# Patient Record
Sex: Male | Born: 1950 | Race: White | Hispanic: No | Marital: Married | Smoking: Former smoker
Health system: Southern US, Community
[De-identification: ages and names within clinical notes are randomized; demographics above are authoritative.]

## PROBLEM LIST (undated history)

## (undated) DIAGNOSIS — E785 Hyperlipidemia, unspecified: Secondary | ICD-10-CM

## (undated) DIAGNOSIS — E876 Hypokalemia: Secondary | ICD-10-CM

## (undated) DIAGNOSIS — K8020 Calculus of gallbladder without cholecystitis without obstruction: Secondary | ICD-10-CM

## (undated) DIAGNOSIS — H9190 Unspecified hearing loss, unspecified ear: Secondary | ICD-10-CM

## (undated) DIAGNOSIS — J45909 Unspecified asthma, uncomplicated: Secondary | ICD-10-CM

## (undated) DIAGNOSIS — J189 Pneumonia, unspecified organism: Secondary | ICD-10-CM

## (undated) DIAGNOSIS — M1990 Unspecified osteoarthritis, unspecified site: Secondary | ICD-10-CM

## (undated) DIAGNOSIS — I10 Essential (primary) hypertension: Secondary | ICD-10-CM

## (undated) DIAGNOSIS — K219 Gastro-esophageal reflux disease without esophagitis: Secondary | ICD-10-CM

## (undated) DIAGNOSIS — M549 Dorsalgia, unspecified: Secondary | ICD-10-CM

## (undated) HISTORY — DX: Calculus of gallbladder without cholecystitis without obstruction: K80.20

## (undated) HISTORY — PX: ABDOMINAL SURGERY: SHX537

## (undated) HISTORY — PX: COLONOSCOPY: SHX174

## (undated) HISTORY — PX: APPENDECTOMY: SHX54

## (undated) HISTORY — DX: Hypokalemia: E87.6

## (undated) HISTORY — PX: CHOLECYSTECTOMY: SHX55

## (undated) HISTORY — DX: Unspecified osteoarthritis, unspecified site: M19.90

## (undated) HISTORY — PX: BACK SURGERY: SHX140

## (undated) HISTORY — PX: SMALL INTESTINE SURGERY: SHX150

## (undated) HISTORY — DX: Essential (primary) hypertension: I10

## (undated) HISTORY — DX: Hyperlipidemia, unspecified: E78.5

---

## 2003-12-09 ENCOUNTER — Emergency Department: Payer: Self-pay | Admitting: Emergency Medicine

## 2007-02-04 ENCOUNTER — Emergency Department (HOSPITAL_COMMUNITY): Admission: EM | Admit: 2007-02-04 | Discharge: 2007-02-04 | Payer: Self-pay | Admitting: Emergency Medicine

## 2013-04-09 ENCOUNTER — Encounter (INDEPENDENT_AMBULATORY_CARE_PROVIDER_SITE_OTHER): Payer: Self-pay

## 2013-04-09 ENCOUNTER — Ambulatory Visit (INDEPENDENT_AMBULATORY_CARE_PROVIDER_SITE_OTHER): Payer: BC Managed Care – PPO | Admitting: Surgery

## 2013-04-09 ENCOUNTER — Encounter (INDEPENDENT_AMBULATORY_CARE_PROVIDER_SITE_OTHER): Payer: Self-pay | Admitting: Surgery

## 2013-04-09 VITALS — BP 154/84 | HR 80 | Temp 98.6°F | Resp 15 | Ht 73.5 in | Wt 231.6 lb

## 2013-04-09 DIAGNOSIS — K8020 Calculus of gallbladder without cholecystitis without obstruction: Secondary | ICD-10-CM

## 2013-04-09 MED ORDER — PANTOPRAZOLE SODIUM 20 MG PO TBEC: 20.0000 mg | DELAYED_RELEASE_TABLET | Freq: Every day | ORAL | Status: DC

## 2013-04-09 NOTE — Progress Notes (Signed)
Patient ID: Zachary Kim, male   DOB: 07/19/1950, 63 y.o.   MRN: [id number]  Chief Complaint  Patient presents with  . New Evaluation    eval gallstones    HPI Zachary Kim is a 63 y.o. male.  Patient presents at the request Suzan Garibaldi FNP with chief complaint of epigastric and right upper quadrant abdominal pain for 2 months. It is intermittent in nature. Sharp and severe with no radiation to his back. Associated dyspepsia and heartburn. He eats fatty greasy foods. Occasional nausea without vomiting. Pain made better with drinking beer. Fatty foods seem to aggravate it.Pain episodes last minutes hours or more common over last 2 months. HPI  Past Medical History  Diagnosis Date  . Hyperlipidemia   . Arthritis     Past Surgical History  Procedure Laterality Date  . Small intestine surgery      intestine blockage  . Back surgery      lover slipped disc    History reviewed. No pertinent family history.  Social History History  Substance Use Topics  . Smoking status: Former Smoker    Quit date: 04/10/1983  . Smokeless tobacco: Current User     Comment: chew tobacco  . Alcohol Use: 1.8 oz/week    3 Cans of beer per week     Comment: daily    Not on File  Current Outpatient Prescriptions  Medication Sig Dispense Refill  . HYDROcodone-acetaminophen (NORCO/VICODIN) 5-325 MG per tablet       . pantoprazole (PROTONIX) 20 MG tablet Take 1 tablet (20 mg total) by mouth daily.  30 tablet  0  . PROVENTIL HFA 108 (90 BASE) MCG/ACT inhaler        No current facility-administered medications for this visit.    Review of Systems Review of Systems  Constitutional: Negative.   HENT: Negative.   Eyes: Negative.   Respiratory: Negative.   Cardiovascular: Negative.   Gastrointestinal: Positive for nausea and abdominal pain.  Endocrine: Negative.   Genitourinary: Negative.   Allergic/Immunologic: Negative.   Neurological: Negative.   Hematological: Negative.     Psychiatric/Behavioral: Negative.     Blood pressure 154/84, pulse 80, temperature 98.6 F (37 C), temperature source Oral, resp. rate 15, height 6' 1.5" (1.867 m), weight 231 lb 9.6 oz (105.053 kg).  Physical Exam Physical Exam  Constitutional: He is oriented to person, place, and time. He appears well-developed and well-nourished.  HENT:  Head: Normocephalic and atraumatic.  Eyes: Pupils are equal, round, and reactive to light. No scleral icterus.  Neck: Normal range of motion.  Cardiovascular: Normal rate and regular rhythm.   Pulmonary/Chest: Effort normal and breath sounds normal.  Abdominal: Soft. Bowel sounds are normal. There is no tenderness.    Musculoskeletal: Normal range of motion.  Neurological: He is alert and oriented to person, place, and time.  Skin: Skin is warm and dry.  Psychiatric: He has a normal mood and affect. His behavior is normal. Judgment and thought content normal.    Data Reviewed Ultrasound from [facility] shows gallstones multiple without gallbladder wall thickening common bile duct 4 mm  Assessment    Symptomatic cholelithiasis    Plan    Laparoscopic cholecystectomy with cholangiogram.The procedure has been discussed with the patient. Operative and non operative treatments have been discussed. Risks of surgery include bleeding, infection,  Common bile duct injury,  Injury to the stomach,liver, colon,small intestine, abdominal wall,  Diaphragm,  Major blood vessels,  And the need for an open  procedure.  Other risks include worsening of medical problems, death,  DVT and pulmonary embolism, and cardiovascular events.   Medical options have also been discussed. The patient has been informed of long term expectations of surgery and non surgical options,  The patient agrees to proceed.         Dmarius Reeder A. 04/09/2013, 4:30 PM

## 2013-04-09 NOTE — Patient Instructions (Signed)
Laparoscopic Cholecystectomy Laparoscopic cholecystectomy is surgery to remove the gallbladder. The gallbladder is located in the upper right part of the abdomen, behind the liver. It is a storage sac for bile produced in the liver. Bile aids in the digestion and absorption of fats. Cholecystectomy is often done for inflammation of the gallbladder (cholecystitis). This condition is usually caused by a buildup of gallstones (cholelithiasis) in your gallbladder. Gallstones can block the flow of bile, resulting in inflammation and pain. In severe cases, emergency surgery may be required. When emergency surgery is not required, you will have time to prepare for the procedure. Laparoscopic surgery is an alternative to open surgery. Laparoscopic surgery has a shorter recovery time. Your common bile duct may also need to be examined during the procedure. If stones are found in the common bile duct, they may be removed. LET [facility] CARE PROVIDER KNOW ABOUT:  Any allergies you have.  All medicines you are taking, including vitamins, herbs, eye drops, creams, and over-the-counter medicines.  Previous problems you or members of your family have had with the use of anesthetics.  Any blood disorders you have.  Previous surgeries you have had.  Medical conditions you have. RISKS AND COMPLICATIONS Generally, this is a safe procedure. However, as with any procedure, complications can occur. Possible complications include:  Infection.  Damage to the common bile duct, nerves, arteries, veins, or other internal organs such as the stomach, liver, or intestines.  Bleeding.  A stone may remain in the common bile duct.  A bile leak from the cyst duct that is clipped when your gallbladder is removed.  The need to convert to open surgery, which requires a larger incision in the abdomen. This may be necessary if your surgeon thinks it is not safe to continue with a laparoscopic procedure. BEFORE THE  PROCEDURE  Ask your health care provider about changing or stopping any regular medicines. You will need to stop taking aspirin or blood thinners at least 5 days prior to surgery.  Do not eat or drink anything after midnight the night before surgery.  Let your health care provider know if you develop a cold or other infectious problem before surgery. PROCEDURE   You will be given medicine to make you sleep through the procedure (general anesthetic). A breathing tube will be placed in your mouth.  When you are asleep, your surgeon will make several small cuts (incisions) in your abdomen.  A thin, lighted tube with a tiny camera on the end (laparoscope) is inserted through one of the small incisions. The camera on the laparoscope sends a picture to a TV screen in the operating room. This gives the surgeon a good view inside your abdomen.  A gas will be pumped into your abdomen. This expands your abdomen so that the surgeon has more room to perform the surgery.  Other tools needed for the procedure are inserted through the other incisions. The gallbladder is removed through one of the incisions.  After the removal of your gallbladder, the incisions will be closed with stitches, staples, or skin glue. AFTER THE PROCEDURE  You will be taken to a recovery area where your progress will be checked often.  You may be allowed to go home the same day if your pain is controlled and you can tolerate liquids. Document Released: 02/01/2005 Document Revised: 11/22/2012 Document Reviewed: 09/13/2012 [facility] Patient Information 2014 [place]. Fat and Cholesterol Control Diet Fat and cholesterol levels in your blood and organs are influenced by  your diet. High levels of fat and cholesterol may lead to diseases of the heart, small and large blood vessels, gallbladder, liver, and pancreas. CONTROLLING FAT AND CHOLESTEROL WITH DIET Although exercise and lifestyle factors are important, your diet is  key. That is because certain foods are known to raise cholesterol and others to lower it. The goal is to balance foods for their effect on cholesterol and more importantly, to replace saturated and trans fat with other types of fat, such as monounsaturated fat, polyunsaturated fat, and omega-3 fatty acids. On average, a person should consume no more than 15 to 17 g of saturated fat daily. Saturated and trans fats are considered "bad" fats, and they will raise LDL cholesterol. Saturated fats are primarily found in animal products such as meats, butter, and cream. However, that does not mean you need to give up all your favorite foods. Today, there are good tasting, low-fat, low-cholesterol substitutes for most of the things you like to eat. Choose low-fat or nonfat alternatives. Choose round or loin cuts of red meat. These types of cuts are lowest in fat and cholesterol. Chicken (without the skin), fish, veal, and ground [place] breast are great choices. Eliminate fatty meats, such as hot dogs and salami. Even shellfish have little or no saturated fat. Have a 3 oz (85 g) portion when you eat lean meat, poultry, or fish. Trans fats are also called "partially hydrogenated oils." They are oils that have been scientifically manipulated so that they are solid at room temperature resulting in a longer shelf life and improved taste and texture of foods in which they are added. Trans fats are found in stick margarine, some tub margarines, cookies, crackers, and baked goods.  When baking and cooking, oils are a great substitute for butter. The monounsaturated oils are especially beneficial since it is believed they lower LDL and raise HDL. The oils you should avoid entirely are saturated tropical oils, such as coconut and palm.  Remember to eat a lot from food groups that are naturally free of saturated and trans fat, including fish, fruit, vegetables, beans, grains (barley, rice, couscous, bulgur wheat), and pasta  (without cream sauces).  IDENTIFYING FOODS THAT LOWER FAT AND CHOLESTEROL  Soluble fiber may lower your cholesterol. This type of fiber is found in fruits such as apples, vegetables such as broccoli, potatoes, and carrots, legumes such as beans, peas, and lentils, and grains such as barley. Foods fortified with plant sterols (phytosterol) may also lower cholesterol. You should eat at least 2 g per day of these foods for a cholesterol lowering effect.  Read package labels to identify low-saturated fats, trans fat free, and low-fat foods at the supermarket. Select cheeses that have only 2 to 3 g saturated fat per ounce. Use a heart-healthy tub margarine that is free of trans fats or partially hydrogenated oil. When buying baked goods (cookies, crackers), avoid partially hydrogenated oils. Breads and muffins should be made from whole grains (whole-wheat or whole oat flour, instead of "flour" or "enriched flour"). Buy non-creamy canned soups with reduced salt and no added fats.  FOOD PREPARATION TECHNIQUES  Never deep-fry. If you must fry, either stir-fry, which uses very little fat, or use non-stick cooking sprays. When possible, broil, bake, or roast meats, and steam vegetables. Instead of putting butter or margarine on vegetables, use lemon and herbs, applesauce, and cinnamon (for squash and sweet potatoes). Use nonfat yogurt, salsa, and low-fat dressings for salads.  LOW-SATURATED FAT / LOW-FAT FOOD SUBSTITUTES Meats /  Saturated Fat (g)  Avoid: Steak, marbled (3 oz/85 g) / 11 g  Choose: Steak, lean (3 oz/85 g) / 4 g  Avoid: Hamburger (3 oz/85 g) / 7 g  Choose: Hamburger, lean (3 oz/85 g) / 5 g  Avoid: Ham (3 oz/85 g) / 6 g  Choose: Ham, lean cut (3 oz/85 g) / 2.4 g  Avoid: Chicken, with skin, dark meat (3 oz/85 g) / 4 g  Choose: Chicken, skin removed, dark meat (3 oz/85 g) / 2 g  Avoid: Chicken, with skin, light meat (3 oz/85 g) / 2.5 g  Choose: Chicken, skin removed, light meat (3 oz/85  g) / 1 g Dairy / Saturated Fat (g)  Avoid: Whole milk (1 cup) / 5 g  Choose: Low-fat milk, 2% (1 cup) / 3 g  Choose: Low-fat milk, 1% (1 cup) / 1.5 g  Choose: Skim milk (1 cup) / 0.3 g  Avoid: Hard cheese (1 oz/28 g) / 6 g  Choose: Skim milk cheese (1 oz/28 g) / 2 to 3 g  Avoid: Cottage cheese, 4% fat (1 cup) / 6.5 g  Choose: Low-fat cottage cheese, 1% fat (1 cup) / 1.5 g  Avoid: Ice cream (1 cup) / 9 g  Choose: Sherbet (1 cup) / 2.5 g  Choose: Nonfat frozen yogurt (1 cup) / 0.3 g  Choose: Frozen fruit bar / trace  Avoid: Whipped cream (1 tbs) / 3.5 g  Choose: Nondairy whipped topping (1 tbs) / 1 g Condiments / Saturated Fat (g)  Avoid: Mayonnaise (1 tbs) / 2 g  Choose: Low-fat mayonnaise (1 tbs) / 1 g  Avoid: Butter (1 tbs) / 7 g  Choose: Extra light margarine (1 tbs) / 1 g  Avoid: Coconut oil (1 tbs) / 11.8 g  Choose: Olive oil (1 tbs) / 1.8 g  Choose: Corn oil (1 tbs) / 1.7 g  Choose: Safflower oil (1 tbs) / 1.2 g  Choose: Sunflower oil (1 tbs) / 1.4 g  Choose: Soybean oil (1 tbs) / 2.4 g  Choose: Canola oil (1 tbs) / 1 g Document Released: 02/01/2005 Document Revised: 05/29/2012 Document Reviewed: 07/23/2010 [employer] Patient Information 2014 [place], [place].

## 2013-04-12 ENCOUNTER — Encounter (HOSPITAL_COMMUNITY): Payer: Self-pay | Admitting: Pharmacy Technician

## 2013-04-18 ENCOUNTER — Encounter (HOSPITAL_COMMUNITY): Payer: Self-pay

## 2013-04-18 ENCOUNTER — Encounter (HOSPITAL_COMMUNITY)
Admission: RE | Admit: 2013-04-18 | Discharge: 2013-04-18 | Disposition: A | Discharge status: Home / Self Care | Payer: BC Managed Care – PPO | Source: Ambulatory Visit | Attending: Surgery | Admitting: Surgery

## 2013-04-18 DIAGNOSIS — Z01818 Encounter for other preprocedural examination: Secondary | ICD-10-CM | POA: Insufficient documentation

## 2013-04-18 DIAGNOSIS — Z01812 Encounter for preprocedural laboratory examination: Secondary | ICD-10-CM | POA: Insufficient documentation

## 2013-04-18 HISTORY — DX: Gastro-esophageal reflux disease without esophagitis: K21.9

## 2013-04-18 HISTORY — DX: Unspecified hearing loss, unspecified ear: H91.90

## 2013-04-18 LAB — COMPREHENSIVE METABOLIC PANEL
ALBUMIN: 3.9 g/dL (ref 3.5–5.2)
ALT: 16 U/L (ref 0–53)
AST: 19 U/L (ref 0–37)
Alkaline Phosphatase: 69 U/L (ref 39–117)
BILIRUBIN TOTAL: 0.5 mg/dL (ref 0.3–1.2)
BUN: 13 mg/dL (ref 6–23)
CALCIUM: 9.2 mg/dL (ref 8.4–10.5)
CHLORIDE: 101 meq/L (ref 96–112)
CO2: 23 meq/L (ref 19–32)
Creatinine, Ser: 0.84 mg/dL (ref 0.50–1.35)
GFR calc Af Amer: >90 mL/min (ref >90)
GFR calc non Af Amer: >90 mL/min (ref >90)
Glucose, Bld: 103 mg/dL — ABNORMAL HIGH (ref 70–99)
Potassium: 4.6 mEq/L (ref 3.7–5.3)
Sodium: 139 mEq/L (ref 137–147)
TOTAL PROTEIN: 7.1 g/dL (ref 6.0–8.3)

## 2013-04-18 LAB — CBC WITH DIFFERENTIAL/PLATELET
Basophils Absolute: 0 10*3/uL (ref 0.0–0.1)
Basophils Relative: 0 % (ref 0–1)
EOS PCT: 1 % (ref 0–5)
Eosinophils Absolute: 0.1 10*3/uL (ref 0.0–0.7)
HEMATOCRIT: 40.1 % (ref 39.0–52.0)
HEMOGLOBIN: 14.1 g/dL (ref 13.0–17.0)
Lymphocytes Relative: 17 % (ref 12–46)
Lymphs Abs: 1.4 10*3/uL (ref 0.7–4.0)
MCH: 33.1 pg (ref 26.0–34.0)
MCHC: 35.2 g/dL (ref 30.0–36.0)
MCV: 94.1 fL (ref 78.0–100.0)
MONOS PCT: 7 % (ref 3–12)
Monocytes Absolute: 0.6 10*3/uL (ref 0.1–1.0)
NEUTROS ABS: 6.5 10*3/uL (ref 1.7–7.7)
NEUTROS PCT: 75 % (ref 43–77)
PLATELETS: 187 10*3/uL (ref 150–400)
RBC: 4.26 MIL/uL (ref 4.22–5.81)
RDW: 13.5 % (ref 11.5–15.5)
WBC: 8.6 10*3/uL (ref 4.0–10.5)

## 2013-04-18 NOTE — Progress Notes (Signed)
04/18/13 1200  OBSTRUCTIVE SLEEP APNEA  Have you ever been diagnosed with sleep apnea through a sleep study? No  Do you snore loudly (loud enough to be heard through closed doors)?  1  Do you often feel tired, fatigued, or sleepy during the daytime? 1  Has anyone observed you stop breathing during your sleep? 1  Do you have, or are you being treated for high blood pressure? 0  BMI more than 35 kg/m2? 0  Age over 62 years old? 1  Neck circumference greater than 40 cm/18 inches? 0  Gender: 1  Obstructive Sleep Apnea Score 5  Score 4 or greater  Results sent to PCP

## 2013-04-18 NOTE — Pre-Procedure Instructions (Signed)
Zachary Kim  04/18/2013   Your procedure is scheduled on:  March 10  Report to [facility] Entrance "A" [address] at [employer] AM.  Call this number if you have problems the morning of surgery: [phone]   Remember:   Do not eat food or drink liquids after midnight.   Take these medicines the morning of surgery with A SIP OF WATER: Hydrocodone, Protonix, Proventil (if needed)   STOP Multiple Vitamins and Ibuprofen today   STOP/ Do not take Aspirin, Aleve, Naproxen, Advil, Ibuprofen, Vitamin, Herbs, or Supplements starting today   Do not wear jewelry, make-up or nail polish.  Do not wear lotions, powders, or perfumes. You may wear deodorant.  Do not shave 48 hours prior to surgery. Men may shave face and neck.  Do not bring valuables to the hospital.  [facility] is not responsible                  for any belongings or valuables.               Contacts, dentures or bridgework may not be worn into surgery.  Leave suitcase in the car. After surgery it may be brought to your room.  For patients admitted to the hospital, discharge time is determined by your                treatment team.               Patients discharged the day of surgery will not be allowed to drive  home.  Name and phone number of your driver: Family/ Friend  Special Instructions: See  [facility] Preparing for Surgery   Please read over the following fact sheets that you were given: Pain Booklet, Coughing and Deep Breathing and Surgical Site Infection Prevention

## 2013-04-23 MED ORDER — CEFAZOLIN SODIUM-DEXTROSE 2-3 GM-% IV SOLR
2.0000 g | INTRAVENOUS | Status: AC
  Administered 2013-04-24: 2 g via INTRAVENOUS
  Filled 2013-04-23: qty 50

## 2013-04-24 ENCOUNTER — Inpatient Hospital Stay (HOSPITAL_COMMUNITY)
Admission: RE | Admit: 2013-04-24 | Discharge: 2013-05-01 | DRG: 415 | Disposition: A | Discharge status: Home / Self Care | Payer: BC Managed Care – PPO | Source: Ambulatory Visit | Attending: Surgery | Admitting: Surgery

## 2013-04-24 ENCOUNTER — Encounter (HOSPITAL_COMMUNITY)
Admission: RE | Disposition: A | Discharge status: Home / Self Care | Payer: Self-pay | Source: Ambulatory Visit | Attending: Surgery

## 2013-04-24 ENCOUNTER — Encounter (HOSPITAL_COMMUNITY): Payer: BC Managed Care – PPO | Admitting: Certified Registered Nurse Anesthetist

## 2013-04-24 ENCOUNTER — Encounter (HOSPITAL_COMMUNITY): Payer: Self-pay | Admitting: *Deleted

## 2013-04-24 ENCOUNTER — Ambulatory Visit (HOSPITAL_COMMUNITY): Payer: BC Managed Care – PPO | Admitting: Certified Registered Nurse Anesthetist

## 2013-04-24 DIAGNOSIS — Z5331 Laparoscopic surgical procedure converted to open procedure: Secondary | ICD-10-CM

## 2013-04-24 DIAGNOSIS — K8010 Calculus of gallbladder with chronic cholecystitis without obstruction: Secondary | ICD-10-CM

## 2013-04-24 DIAGNOSIS — Y849 Medical procedure, unspecified as the cause of abnormal reaction of the patient, or of later complication, without mention of misadventure at the time of the procedure: Secondary | ICD-10-CM | POA: Diagnosis not present

## 2013-04-24 DIAGNOSIS — Z79899 Other long term (current) drug therapy: Secondary | ICD-10-CM

## 2013-04-24 DIAGNOSIS — K8020 Calculus of gallbladder without cholecystitis without obstruction: Principal | ICD-10-CM | POA: Diagnosis present

## 2013-04-24 DIAGNOSIS — R1013 Epigastric pain: Secondary | ICD-10-CM

## 2013-04-24 DIAGNOSIS — Z87891 Personal history of nicotine dependence: Secondary | ICD-10-CM

## 2013-04-24 DIAGNOSIS — E785 Hyperlipidemia, unspecified: Secondary | ICD-10-CM | POA: Diagnosis present

## 2013-04-24 DIAGNOSIS — K560 Paralytic ileus: Secondary | ICD-10-CM | POA: Diagnosis not present

## 2013-04-24 DIAGNOSIS — K660 Peritoneal adhesions (postprocedural) (postinfection): Secondary | ICD-10-CM | POA: Diagnosis present

## 2013-04-24 DIAGNOSIS — K929 Disease of digestive system, unspecified: Secondary | ICD-10-CM | POA: Diagnosis not present

## 2013-04-24 DIAGNOSIS — K3189 Other diseases of stomach and duodenum: Secondary | ICD-10-CM | POA: Diagnosis present

## 2013-04-24 DIAGNOSIS — Z9049 Acquired absence of other specified parts of digestive tract: Secondary | ICD-10-CM

## 2013-04-24 HISTORY — PX: CHOLECYSTECTOMY: SHX55

## 2013-04-24 HISTORY — PX: LYSIS OF ADHESION: SHX5961

## 2013-04-24 LAB — CBC
HCT: 38.3 % — ABNORMAL LOW (ref 39.0–52.0)
Hemoglobin: 13.5 g/dL (ref 13.0–17.0)
MCH: 33.8 pg (ref 26.0–34.0)
MCHC: 35.2 g/dL (ref 30.0–36.0)
MCV: 95.8 fL (ref 78.0–100.0)
PLATELETS: 147 10*3/uL — AB (ref 150–400)
RBC: 4 MIL/uL — AB (ref 4.22–5.81)
RDW: 13.9 % (ref 11.5–15.5)
WBC: 17.3 10*3/uL — ABNORMAL HIGH (ref 4.0–10.5)

## 2013-04-24 LAB — CREATININE, SERUM
Creatinine, Ser: 0.93 mg/dL (ref 0.50–1.35)
GFR, EST NON AFRICAN AMERICAN: 89 mL/min — AB (ref >90)

## 2013-04-24 SURGERY — LAPAROSCOPIC CHOLECYSTECTOMY WITH INTRAOPERATIVE CHOLANGIOGRAM
Anesthesia: General | Site: Abdomen

## 2013-04-24 MED ORDER — LACTATED RINGERS IV SOLN
INTRAVENOUS | Status: DC
  Administered 2013-04-24 (×2): via INTRAVENOUS

## 2013-04-24 MED ORDER — CHLORHEXIDINE GLUCONATE 4 % EX LIQD: 1.0000 "application " | Freq: Once | CUTANEOUS | Status: DC

## 2013-04-24 MED ORDER — ONDANSETRON HCL 4 MG/2ML IJ SOLN
INTRAMUSCULAR | Status: AC
  Filled 2013-04-24: qty 2

## 2013-04-24 MED ORDER — LIDOCAINE HCL (CARDIAC) 20 MG/ML IV SOLN
INTRAVENOUS | Status: AC
  Filled 2013-04-24: qty 5

## 2013-04-24 MED ORDER — BUPIVACAINE-EPINEPHRINE 0.25% -1:200000 IJ SOLN
INTRAMUSCULAR | Status: DC | PRN
  Administered 2013-04-24: 30 mL

## 2013-04-24 MED ORDER — ENOXAPARIN SODIUM 40 MG/0.4ML ~~LOC~~ SOLN
40.0000 mg | SUBCUTANEOUS | Status: DC
  Administered 2013-04-25 – 2013-05-01 (×7): 40 mg via SUBCUTANEOUS
  Filled 2013-04-24 (×9): qty 0.4

## 2013-04-24 MED ORDER — ROCURONIUM BROMIDE 50 MG/5ML IV SOLN
INTRAVENOUS | Status: AC
  Filled 2013-04-24: qty 1

## 2013-04-24 MED ORDER — LIDOCAINE HCL (CARDIAC) 20 MG/ML IV SOLN
INTRAVENOUS | Status: DC | PRN
  Administered 2013-04-24: 100 mg via INTRAVENOUS

## 2013-04-24 MED ORDER — DEXTROSE IN LACTATED RINGERS 5 % IV SOLN
INTRAVENOUS | Status: DC
  Administered 2013-04-24 – 2013-04-25 (×2): via INTRAVENOUS

## 2013-04-24 MED ORDER — GLYCOPYRROLATE 0.2 MG/ML IJ SOLN
INTRAMUSCULAR | Status: DC | PRN
  Administered 2013-04-24: .8 mg via INTRAVENOUS

## 2013-04-24 MED ORDER — PROPOFOL 10 MG/ML IV BOLUS
INTRAVENOUS | Status: DC | PRN
  Administered 2013-04-24: 200 mg via INTRAVENOUS

## 2013-04-24 MED ORDER — SODIUM CHLORIDE 0.9 % IJ SOLN: 9.0000 mL | INTRAMUSCULAR | Status: DC | PRN

## 2013-04-24 MED ORDER — NEOSTIGMINE METHYLSULFATE 1 MG/ML IJ SOLN
INTRAMUSCULAR | Status: DC | PRN
  Administered 2013-04-24: 5 mg via INTRAVENOUS

## 2013-04-24 MED ORDER — OXYCODONE HCL 5 MG/5ML PO SOLN: 5.0000 mg | Freq: Once | ORAL | Status: AC | PRN

## 2013-04-24 MED ORDER — OXYCODONE-ACETAMINOPHEN 5-325 MG PO TABS
1.0000 | ORAL_TABLET | ORAL | Status: DC | PRN
  Administered 2013-04-24 – 2013-04-25 (×2): 2 via ORAL
  Filled 2013-04-24 (×2): qty 2

## 2013-04-24 MED ORDER — PROMETHAZINE HCL 25 MG/ML IJ SOLN: 6.2500 mg | INTRAMUSCULAR | Status: DC | PRN

## 2013-04-24 MED ORDER — NEOSTIGMINE METHYLSULFATE 1 MG/ML IJ SOLN
INTRAMUSCULAR | Status: AC
  Filled 2013-04-24: qty 10

## 2013-04-24 MED ORDER — HYDROMORPHONE HCL PF 1 MG/ML IJ SOLN
INTRAMUSCULAR | Status: AC
  Filled 2013-04-24: qty 1

## 2013-04-24 MED ORDER — HYDROMORPHONE HCL PF 1 MG/ML IJ SOLN
0.2500 mg | INTRAMUSCULAR | Status: DC | PRN
  Administered 2013-04-24 (×4): 0.5 mg via INTRAVENOUS

## 2013-04-24 MED ORDER — MIDAZOLAM HCL 2 MG/2ML IJ SOLN
INTRAMUSCULAR | Status: AC
  Filled 2013-04-24: qty 2

## 2013-04-24 MED ORDER — HEMOSTATIC AGENTS (NO CHARGE) OPTIME
TOPICAL | Status: DC | PRN
  Administered 2013-04-24: 1 via TOPICAL

## 2013-04-24 MED ORDER — MIDAZOLAM HCL 5 MG/5ML IJ SOLN
INTRAMUSCULAR | Status: DC | PRN
  Administered 2013-04-24: 2 mg via INTRAVENOUS

## 2013-04-24 MED ORDER — PROPOFOL 10 MG/ML IV BOLUS
INTRAVENOUS | Status: AC
  Filled 2013-04-24: qty 20

## 2013-04-24 MED ORDER — 0.9 % SODIUM CHLORIDE (POUR BTL) OPTIME
TOPICAL | Status: DC | PRN
  Administered 2013-04-24: 1000 mL

## 2013-04-24 MED ORDER — GLYCOPYRROLATE 0.2 MG/ML IJ SOLN
INTRAMUSCULAR | Status: AC
  Filled 2013-04-24: qty 5

## 2013-04-24 MED ORDER — SODIUM CHLORIDE 0.9 % IV SOLN
INTRAVENOUS | Status: DC | PRN
  Administered 2013-04-24: 10:00:00

## 2013-04-24 MED ORDER — DIPHENHYDRAMINE HCL 12.5 MG/5ML PO ELIX: 12.5000 mg | ORAL_SOLUTION | Freq: Four times a day (QID) | ORAL | Status: DC | PRN

## 2013-04-24 MED ORDER — ROCURONIUM BROMIDE 100 MG/10ML IV SOLN
INTRAVENOUS | Status: DC | PRN
  Administered 2013-04-24: 10 mg via INTRAVENOUS
  Administered 2013-04-24 (×3): 5 mg via INTRAVENOUS
  Administered 2013-04-24: 50 mg via INTRAVENOUS
  Administered 2013-04-24: 5 mg via INTRAVENOUS
  Administered 2013-04-24: 10 mg via INTRAVENOUS

## 2013-04-24 MED ORDER — NALOXONE HCL 0.4 MG/ML IJ SOLN: 0.4000 mg | INTRAMUSCULAR | Status: DC | PRN

## 2013-04-24 MED ORDER — ONDANSETRON HCL 4 MG/2ML IJ SOLN
INTRAMUSCULAR | Status: DC | PRN
  Administered 2013-04-24: 4 mg via INTRAVENOUS

## 2013-04-24 MED ORDER — FENTANYL CITRATE 0.05 MG/ML IJ SOLN: 50.0000 ug | Freq: Once | INTRAMUSCULAR | Status: DC

## 2013-04-24 MED ORDER — ONDANSETRON HCL 4 MG/2ML IJ SOLN
4.0000 mg | Freq: Four times a day (QID) | INTRAMUSCULAR | Status: DC | PRN
  Filled 2013-04-24: qty 2

## 2013-04-24 MED ORDER — ONDANSETRON HCL 4 MG/2ML IJ SOLN
4.0000 mg | Freq: Four times a day (QID) | INTRAMUSCULAR | Status: DC | PRN
  Administered 2013-04-24 – 2013-04-27 (×3): 4 mg via INTRAVENOUS
  Filled 2013-04-24 (×2): qty 2

## 2013-04-24 MED ORDER — FENTANYL CITRATE 0.05 MG/ML IJ SOLN
INTRAMUSCULAR | Status: AC
  Filled 2013-04-24: qty 5

## 2013-04-24 MED ORDER — PANTOPRAZOLE SODIUM 20 MG PO TBEC
20.0000 mg | DELAYED_RELEASE_TABLET | Freq: Every day | ORAL | Status: DC
  Administered 2013-04-25 – 2013-05-01 (×7): 20 mg via ORAL
  Filled 2013-04-24 (×7): qty 1

## 2013-04-24 MED ORDER — ALBUTEROL SULFATE (2.5 MG/3ML) 0.083% IN NEBU: 3.0000 mL | INHALATION_SOLUTION | Freq: Four times a day (QID) | RESPIRATORY_TRACT | Status: DC | PRN

## 2013-04-24 MED ORDER — MORPHINE SULFATE 2 MG/ML IJ SOLN: 2.0000 mg | INTRAMUSCULAR | Status: DC | PRN

## 2013-04-24 MED ORDER — MIDAZOLAM HCL 2 MG/2ML IJ SOLN: 1.0000 mg | INTRAMUSCULAR | Status: DC | PRN

## 2013-04-24 MED ORDER — KETOROLAC TROMETHAMINE 15 MG/ML IJ SOLN
15.0000 mg | Freq: Four times a day (QID) | INTRAMUSCULAR | Status: AC | PRN
  Administered 2013-04-24 – 2013-04-25 (×2): 15 mg via INTRAVENOUS
  Filled 2013-04-24: qty 1

## 2013-04-24 MED ORDER — DIPHENHYDRAMINE HCL 50 MG/ML IJ SOLN: 12.5000 mg | Freq: Four times a day (QID) | INTRAMUSCULAR | Status: DC | PRN

## 2013-04-24 MED ORDER — ONDANSETRON HCL 4 MG PO TABS: 4.0000 mg | ORAL_TABLET | Freq: Four times a day (QID) | ORAL | Status: DC | PRN

## 2013-04-24 MED ORDER — HYDROMORPHONE 0.3 MG/ML IV SOLN
INTRAVENOUS | Status: DC
  Administered 2013-04-24: 13:00:00 via INTRAVENOUS
  Administered 2013-04-24: 3 mg via INTRAVENOUS
  Administered 2013-04-25: 1.2 mg via INTRAVENOUS
  Administered 2013-04-25: 07:00:00 via INTRAVENOUS
  Administered 2013-04-25: 1.2 mg via INTRAVENOUS
  Filled 2013-04-24: qty 25

## 2013-04-24 MED ORDER — LIDOCAINE HCL 4 % MT SOLN
OROMUCOSAL | Status: DC | PRN
  Administered 2013-04-24: 4 mL via TOPICAL

## 2013-04-24 MED ORDER — OXYCODONE HCL 5 MG PO TABS
5.0000 mg | ORAL_TABLET | Freq: Once | ORAL | Status: AC | PRN
Start: 2013-04-24 — End: 2013-04-24
  Administered 2013-04-24: 5 mg via ORAL

## 2013-04-24 MED ORDER — HYDROMORPHONE 0.3 MG/ML IV SOLN
INTRAVENOUS | Status: AC
  Filled 2013-04-24: qty 25

## 2013-04-24 MED ORDER — SODIUM CHLORIDE 0.9 % IR SOLN
Status: DC | PRN
  Administered 2013-04-24: 1000 mL

## 2013-04-24 MED ORDER — FENTANYL CITRATE 0.05 MG/ML IJ SOLN
INTRAMUSCULAR | Status: DC | PRN
  Administered 2013-04-24 (×10): 50 ug via INTRAVENOUS

## 2013-04-24 SURGICAL SUPPLY — 72 items
ADH SKN CLS APL DERMABOND .7 (GAUZE/BANDAGES/DRESSINGS) ×3
APPLICATOR COTTON TIP 6IN STRL (MISCELLANEOUS) ×1 IMPLANT
APPLIER CLIP 5 13 M/L LIGAMAX5 (MISCELLANEOUS) ×8
APPLIER CLIP ROT 10 11.4 M/L (STAPLE) ×4
APR CLP MED LRG 11.4X10 (STAPLE) ×3
APR CLP MED LRG 5 ANG JAW (MISCELLANEOUS) ×6
BAG SPEC RTRVL LRG 6X4 10 (ENDOMECHANICALS) ×3
BLADE SURG 10 STRL SS (BLADE) ×1 IMPLANT
BLADE SURG ROTATE 9660 (MISCELLANEOUS) ×1 IMPLANT
CANISTER SUCTION 2500CC (MISCELLANEOUS) ×4 IMPLANT
CHLORAPREP W/TINT 26ML (MISCELLANEOUS) ×4 IMPLANT
CLIP APPLIE 5 13 M/L LIGAMAX5 (MISCELLANEOUS) IMPLANT
CLIP APPLIE ROT 10 11.4 M/L (STAPLE) ×3 IMPLANT
COVER MAYO STAND STRL (DRAPES) ×4 IMPLANT
COVER SURGICAL LIGHT HANDLE (MISCELLANEOUS) ×4 IMPLANT
DECANTER SPIKE VIAL GLASS SM (MISCELLANEOUS) ×8 IMPLANT
DERMABOND ADVANCED (GAUZE/BANDAGES/DRESSINGS) ×1
DERMABOND ADVANCED .7 DNX12 (GAUZE/BANDAGES/DRESSINGS) ×3 IMPLANT
DRAPE C-ARM 42X72 X-RAY (DRAPES) ×4 IMPLANT
DRAPE UTILITY 15X26 W/TAPE STR (DRAPE) ×8 IMPLANT
DRAPE WARM FLUID 44X44 (DRAPE) ×4 IMPLANT
ELECT BLADE 6.5 EXT (BLADE) ×1 IMPLANT
ELECT CAUTERY BLADE 6.4 (BLADE) ×1 IMPLANT
ELECT REM PT RETURN 9FT ADLT (ELECTROSURGICAL) ×4
ELECTRODE REM PT RTRN 9FT ADLT (ELECTROSURGICAL) ×3 IMPLANT
GAUZE SPONGE 2X2 8PLY STRL LF (GAUZE/BANDAGES/DRESSINGS) IMPLANT
GLOVE BIO SURGEON STRL SZ7 (GLOVE) ×1 IMPLANT
GLOVE BIO SURGEON STRL SZ7.5 (GLOVE) ×1 IMPLANT
GLOVE BIO SURGEON STRL SZ8 (GLOVE) ×4 IMPLANT
GLOVE BIOGEL PI IND STRL 7.0 (GLOVE) IMPLANT
GLOVE BIOGEL PI IND STRL 7.5 (GLOVE) IMPLANT
GLOVE BIOGEL PI IND STRL 8 (GLOVE) ×3 IMPLANT
GLOVE BIOGEL PI INDICATOR 7.0 (GLOVE) ×1
GLOVE BIOGEL PI INDICATOR 7.5 (GLOVE) ×3
GLOVE BIOGEL PI INDICATOR 8 (GLOVE) ×1
GLOVE ECLIPSE 6.5 STRL STRAW (GLOVE) ×1 IMPLANT
GLOVE ECLIPSE 7.5 STRL STRAW (GLOVE) ×1 IMPLANT
GOWN STRL REUS W/ TWL LRG LVL3 (GOWN DISPOSABLE) ×12 IMPLANT
GOWN STRL REUS W/ TWL XL LVL3 (GOWN DISPOSABLE) ×3 IMPLANT
GOWN STRL REUS W/TWL LRG LVL3 (GOWN DISPOSABLE) ×16
GOWN STRL REUS W/TWL XL LVL3 (GOWN DISPOSABLE) ×4
HEMOSTAT SNOW SURGICEL 2X4 (HEMOSTASIS) ×1 IMPLANT
KIT BASIN OR (CUSTOM PROCEDURE TRAY) ×4 IMPLANT
KIT ROOM TURNOVER OR (KITS) ×4 IMPLANT
NS IRRIG 1000ML POUR BTL (IV SOLUTION) ×8 IMPLANT
PAD ARMBOARD 7.5X6 YLW CONV (MISCELLANEOUS) ×4 IMPLANT
PENCIL BUTTON HOLSTER BLD 10FT (ELECTRODE) ×1 IMPLANT
POUCH SPECIMEN RETRIEVAL 10MM (ENDOMECHANICALS) ×4 IMPLANT
SCISSORS LAP 5X35 DISP (ENDOMECHANICALS) ×4 IMPLANT
SET CHOLANGIOGRAPH 5 50 .035 (SET/KITS/TRAYS/PACK) ×4 IMPLANT
SET IRRIG TUBING LAPAROSCOPIC (IRRIGATION / IRRIGATOR) ×4 IMPLANT
SLEEVE ENDOPATH XCEL 5M (ENDOMECHANICALS) ×7 IMPLANT
SPECIMEN JAR SMALL (MISCELLANEOUS) ×4 IMPLANT
SPONGE GAUZE 2X2 STER 10/PKG (GAUZE/BANDAGES/DRESSINGS) ×1
SPONGE GAUZE 4X4 12PLY (GAUZE/BANDAGES/DRESSINGS) ×1 IMPLANT
SPONGE INTESTINAL PEANUT (DISPOSABLE) ×1 IMPLANT
SPONGE LAP 18X18 X RAY DECT (DISPOSABLE) ×3 IMPLANT
STAPLER VISISTAT 35W (STAPLE) ×1 IMPLANT
SUT MNCRL AB 4-0 PS2 18 (SUTURE) ×4 IMPLANT
SUT PDS AB 1 CTX 36 (SUTURE) ×4 IMPLANT
SUT VIC AB 3-0 SH 18 (SUTURE) ×2 IMPLANT
SUT VICRYL AB 2 0 TIES (SUTURE) ×1 IMPLANT
SYR BULB IRRIGATION 50ML (SYRINGE) ×1 IMPLANT
TAPE CLOTH SURG 6X10 WHT LF (GAUZE/BANDAGES/DRESSINGS) ×1 IMPLANT
TOWEL OR 17X24 6PK STRL BLUE (TOWEL DISPOSABLE) ×4 IMPLANT
TOWEL OR 17X26 10 PK STRL BLUE (TOWEL DISPOSABLE) ×4 IMPLANT
TRAY LAPAROSCOPIC (CUSTOM PROCEDURE TRAY) ×4 IMPLANT
TROCAR XCEL BLUNT TIP 100MML (ENDOMECHANICALS) ×4 IMPLANT
TROCAR XCEL NON-BLD 11X100MML (ENDOMECHANICALS) ×5 IMPLANT
TROCAR XCEL NON-BLD 5MMX100MML (ENDOMECHANICALS) ×4 IMPLANT
TUBE CONNECTING 12X1/4 (SUCTIONS) ×1 IMPLANT
YANKAUER SUCT BULB TIP NO VENT (SUCTIONS) ×1 IMPLANT

## 2013-04-24 NOTE — Interval H&P Note (Signed)
History and Physical Interval Note:  04/24/2013 9:10 AM  Zachary Kim  has presented today for surgery, with the diagnosis of gallstones  The various methods of treatment have been discussed with the patient and family. After consideration of risks, benefits and other options for treatment, the patient has consented to  Procedure(s): LAPAROSCOPIC CHOLECYSTECTOMY WITH INTRAOPERATIVE CHOLANGIOGRAM (N/A) as a surgical intervention .  The patient's history has been reviewed, patient examined, no change in status, stable for surgery.  I have reviewed the patient's chart and labs.  Questions were answered to the patient's satisfaction.     Aryeh Butterfield A.

## 2013-04-24 NOTE — Brief Op Note (Signed)
04/24/2013  12:19 PM  PATIENT:  Karle Starch A Lemire  63 y.o. male  PRE-OPERATIVE DIAGNOSIS:  gallstones  POST-OPERATIVE DIAGNOSIS:  gallstones  PROCEDURE:  Laparoscopic cholecystectomy converted to open cholecystectomy.   SURGEON:  Surgeon(s) and Role:    * Priyanka Causey A. Ayyan Sites, MD - Primary    * Imogene Burn. Georgette Dover, MD - Assisting  PHYSICIAN ASSISTANT:   ASSISTANTS: Dr Georgette Dover    ANESTHESIA:   general  EBL:  Total I/O In: 1500 [I.V.:1500] Out: 200 [Blood:200]  BLOOD ADMINISTERED:none  DRAINS: none   LOCAL MEDICATIONS USED:  BUPIVICAINE   SPECIMEN:  Source of Specimen:  gallbladder  DISPOSITION OF SPECIMEN:  PATHOLOGY  COUNTS:  YES  TOURNIQUET:  none  DICTATION: .Other Dictation: Dictation Number [id number]  PLAN OF CARE: Admit to inpatient   PATIENT DISPOSITION:  PACU - hemodynamically stable.   Delay start of Pharmacological VTE agent (>24hrs) due to surgical blood loss or risk of bleeding: no

## 2013-04-24 NOTE — Anesthesia Procedure Notes (Signed)
Procedure Name: Intubation Date/Time: 04/24/2013 9:30 AM Performed by: Maryland Pink Pre-anesthesia Checklist: Patient identified, Timeout performed, Emergency Drugs available, Suction available and Patient being monitored Patient Re-evaluated:Patient Re-evaluated prior to inductionOxygen Delivery Method: Circle system utilized Preoxygenation: Pre-oxygenation with 100% oxygen Intubation Type: IV induction Ventilation: Mask ventilation without difficulty and Oral airway inserted - appropriate to patient size Laryngoscope Size: Mac and 4 Grade View: Grade I Tube type: Oral Tube size: 7.5 mm Number of attempts: 1 Airway Equipment and Method: Stylet and LTA kit utilized Placement Confirmation: ETT inserted through vocal cords under direct vision,  positive ETCO2 and breath sounds checked- equal and bilateral Secured at: 22 cm Tube secured with: Tape Dental Injury: Teeth and Oropharynx as per pre-operative assessment

## 2013-04-24 NOTE — Progress Notes (Signed)
Patient has not voided since surgery.Bladder scanned for 88 cc of urine .Will continue to monitor.

## 2013-04-24 NOTE — Progress Notes (Signed)
Patient up walking in room, when patient got out of bed it was noted that there was blood that had run out from under dressing on right side of abdomen and had soaked into pad through to bed.  Patient denied any dizziness.  Vital signs stable.  Notified MD on call, will continue to monitor.  Dressings reinforced.

## 2013-04-24 NOTE — Progress Notes (Signed)
Care of pt assumed by MA Russell Quinney RN from E. Compton RN 

## 2013-04-24 NOTE — Anesthesia Postprocedure Evaluation (Signed)
  Anesthesia Post-op Note  Patient: Zachary Kim  Procedure(s) Performed: Procedure(s) with comments: LAPAROSCOPIC CHOLECYSTECTOMY WITH INTRAOPERATIVE CHOLANGIOGRAM (N/A) - attempted CHOLECYSTECTOMY LYSIS OF ADHESION (N/A)  Patient Location: PACU  Anesthesia Type:General  Level of Consciousness: awake  Airway and Oxygen Therapy: Patient Spontanous Breathing  Post-op Pain: mild  Post-op Assessment: Post-op Vital signs reviewed, Patient's Cardiovascular Status Stable, Respiratory Function Stable, Patent Airway, No signs of Nausea or vomiting and Pain level controlled  Post-op Vital Signs: Reviewed and stable  Complications: No apparent anesthesia complications

## 2013-04-24 NOTE — Progress Notes (Signed)
Lunch relief by A. Britt RN 

## 2013-04-24 NOTE — Anesthesia Preprocedure Evaluation (Signed)
Anesthesia Evaluation  Patient identified by MRN, date of birth, ID band Patient awake    Reviewed: Allergy & Precautions, H&P , NPO status , Patient's Chart, lab work & pertinent test results  Airway Mallampati: II TM Distance: >3 FB Neck ROM: Full    Dental   Pulmonary Recent URI , former smoker,  + rhonchi         Cardiovascular Rhythm:Regular Rate:Normal     Neuro/Psych    GI/Hepatic GERD-  ,  Endo/Other    Renal/GU      Musculoskeletal   Abdominal   Peds  Hematology   Anesthesia Other Findings   Reproductive/Obstetrics                           Anesthesia Physical Anesthesia Plan  ASA: II  Anesthesia Plan: General   Post-op Pain Management:    Induction: Intravenous  Airway Management Planned: Oral ETT  Additional Equipment:   Intra-op Plan:   Post-operative Plan: Extubation in OR  Informed Consent: I have reviewed the patients History and Physical, chart, labs and discussed the procedure including the risks, benefits and alternatives for the proposed anesthesia with the patient or authorized representative who has indicated his/her understanding and acceptance.     Plan Discussed with: CRNA and Surgeon  Anesthesia Plan Comments:         Anesthesia Quick Evaluation

## 2013-04-24 NOTE — H&P (View-Only) (Signed)
Patient ID: Zachary Kim, male   DOB: 10/23/1950, 63 y.o.   MRN: [id number]  Chief Complaint  Patient presents with  . New Evaluation    eval gallstones    HPI Zachary Kim is a 63 y.o. male.  Patient presents at the request Cheryl Wells FNP with chief complaint of epigastric and right upper quadrant abdominal pain for 2 months. It is intermittent in nature. Sharp and severe with no radiation to his back. Associated dyspepsia and heartburn. He eats fatty greasy foods. Occasional nausea without vomiting. Pain made better with drinking beer. Fatty foods seem to aggravate it.Pain episodes last minutes hours or more common over last 2 months. HPI  Past Medical History  Diagnosis Date  . Hyperlipidemia   . Arthritis     Past Surgical History  Procedure Laterality Date  . Small intestine surgery      intestine blockage  . Back surgery      lover slipped disc    History reviewed. No pertinent family history.  Social History History  Substance Use Topics  . Smoking status: Former Smoker    Quit date: 04/10/1983  . Smokeless tobacco: Current User     Comment: chew tobacco  . Alcohol Use: 1.8 oz/week    3 Cans of beer per week     Comment: daily    Not on File  Current Outpatient Prescriptions  Medication Sig Dispense Refill  . HYDROcodone-acetaminophen (NORCO/VICODIN) 5-325 MG per tablet       . pantoprazole (PROTONIX) 20 MG tablet Take 1 tablet (20 mg total) by mouth daily.  30 tablet  0  . PROVENTIL HFA 108 (90 BASE) MCG/ACT inhaler        No current facility-administered medications for this visit.    Review of Systems Review of Systems  Constitutional: Negative.   HENT: Negative.   Eyes: Negative.   Respiratory: Negative.   Cardiovascular: Negative.   Gastrointestinal: Positive for nausea and abdominal pain.  Endocrine: Negative.   Genitourinary: Negative.   Allergic/Immunologic: Negative.   Neurological: Negative.   Hematological: Negative.     Psychiatric/Behavioral: Negative.     Blood pressure 154/84, pulse 80, temperature 98.6 F (37 C), temperature source Oral, resp. rate 15, height 6' 1.5" (1.867 m), weight 231 lb 9.6 oz (105.053 kg).  Physical Exam Physical Exam  Constitutional: He is oriented to person, place, and time. He appears well-developed and well-nourished.  HENT:  Head: Normocephalic and atraumatic.  Eyes: Pupils are equal, round, and reactive to light. No scleral icterus.  Neck: Normal range of motion.  Cardiovascular: Normal rate and regular rhythm.   Pulmonary/Chest: Effort normal and breath sounds normal.  Abdominal: Soft. Bowel sounds are normal. There is no tenderness.    Musculoskeletal: Normal range of motion.  Neurological: He is alert and oriented to person, place, and time.  Skin: Skin is warm and dry.  Psychiatric: He has a normal mood and affect. His behavior is normal. Judgment and thought content normal.    Data Reviewed Ultrasound from [place] shows gallstones multiple without gallbladder wall thickening common bile duct 4 mm  Assessment    Symptomatic cholelithiasis    Plan    Laparoscopic cholecystectomy with cholangiogram.The procedure has been discussed with the patient. Operative and non operative treatments have been discussed. Risks of surgery include bleeding, infection,  Common bile duct injury,  Injury to the stomach,liver, colon,small intestine, abdominal wall,  Diaphragm,  Major blood vessels,  And the need for an open   procedure.  Other risks include worsening of medical problems, death,  DVT and pulmonary embolism, and cardiovascular events.   Medical options have also been discussed. The patient has been informed of long term expectations of surgery and non surgical options,  The patient agrees to proceed.         Janneth Krasner A. 04/09/2013, 4:30 PM

## 2013-04-24 NOTE — Preoperative (Signed)
Beta Blockers   Reason not to administer Beta Blockers:Not Applicable 

## 2013-04-24 NOTE — Transfer of Care (Signed)
Immediate Anesthesia Transfer of Care Note  Patient: Zachary Kim  Procedure(s) Performed: Procedure(s) with comments: LAPAROSCOPIC CHOLECYSTECTOMY WITH INTRAOPERATIVE CHOLANGIOGRAM (N/A) - attempted CHOLECYSTECTOMY LYSIS OF ADHESION (N/A)  Patient Location: PACU  Anesthesia Type:General  Level of Consciousness: awake, alert  and oriented  Airway & Oxygen Therapy: Patient Spontanous Breathing and Patient connected to nasal cannula oxygen  Post-op Assessment: Report given to PACU RN and Post -op Vital signs reviewed and stable  Post vital signs: Reviewed and stable  Complications: No apparent anesthesia complications

## 2013-04-25 ENCOUNTER — Inpatient Hospital Stay (HOSPITAL_COMMUNITY): Payer: BC Managed Care – PPO

## 2013-04-25 LAB — COMPREHENSIVE METABOLIC PANEL
ALBUMIN: 3.3 g/dL — AB (ref 3.5–5.2)
ALT: 22 U/L (ref 0–53)
AST: 23 U/L (ref 0–37)
Alkaline Phosphatase: 55 U/L (ref 39–117)
BILIRUBIN TOTAL: 1.4 mg/dL — AB (ref 0.3–1.2)
BUN: 7 mg/dL (ref 6–23)
CO2: 24 mEq/L (ref 19–32)
Calcium: 9 mg/dL (ref 8.4–10.5)
Chloride: 96 mEq/L (ref 96–112)
Creatinine, Ser: 0.85 mg/dL (ref 0.50–1.35)
GFR calc Af Amer: >90 mL/min (ref >90)
GFR calc non Af Amer: >90 mL/min (ref >90)
Glucose, Bld: 122 mg/dL — ABNORMAL HIGH (ref 70–99)
POTASSIUM: 4.3 meq/L (ref 3.7–5.3)
SODIUM: 132 meq/L — AB (ref 137–147)
TOTAL PROTEIN: 6.2 g/dL (ref 6.0–8.3)

## 2013-04-25 LAB — CBC
HEMATOCRIT: 35.8 % — AB (ref 39.0–52.0)
Hemoglobin: 12.4 g/dL — ABNORMAL LOW (ref 13.0–17.0)
MCH: 33.2 pg (ref 26.0–34.0)
MCHC: 34.6 g/dL (ref 30.0–36.0)
MCV: 95.7 fL (ref 78.0–100.0)
PLATELETS: 143 10*3/uL — AB (ref 150–400)
RBC: 3.74 MIL/uL — ABNORMAL LOW (ref 4.22–5.81)
RDW: 14.1 % (ref 11.5–15.5)
WBC: 11.1 10*3/uL — AB (ref 4.0–10.5)

## 2013-04-25 MED ORDER — HYDROMORPHONE HCL PF 1 MG/ML IJ SOLN
1.0000 mg | INTRAMUSCULAR | Status: DC | PRN
  Administered 2013-04-26 – 2013-04-30 (×20): 1 mg via INTRAVENOUS
  Filled 2013-04-25 (×21): qty 1

## 2013-04-25 MED ORDER — BISACODYL 10 MG RE SUPP
10.0000 mg | Freq: Once | RECTAL | Status: AC
  Administered 2013-04-25: 10 mg via RECTAL
  Filled 2013-04-25: qty 1

## 2013-04-25 MED ORDER — POLYETHYLENE GLYCOL 3350 17 G PO PACK
17.0000 g | PACK | Freq: Every day | ORAL | Status: DC
  Administered 2013-04-25: 17 g via ORAL
  Filled 2013-04-25: qty 1

## 2013-04-25 MED ORDER — OXYCODONE-ACETAMINOPHEN 5-325 MG PO TABS
2.0000 | ORAL_TABLET | ORAL | Status: DC | PRN
  Administered 2013-04-25 – 2013-05-01 (×8): 2 via ORAL
  Filled 2013-04-25 (×9): qty 2

## 2013-04-25 NOTE — Progress Notes (Signed)
1 Day Post-Op  Subjective: DID NOT SLEEP WELL  Objective: Vital signs in last 24 hours: Temp:  [97.4 F (36.3 C)-98.7 F (37.1 C)] 97.9 F (36.6 C) (03/11 0543) Pulse Rate:  [64-88] 76 (03/11 0543) Resp:  [8-20] 16 (03/11 0713) BP: (113-183)/(61-85) 127/73 mmHg (03/11 0543) SpO2:  [94 %-100 %] 96 % (03/11 0713) Last BM Date: 04/24/13  Intake/Output from previous day: 03/10 0701 - 03/11 0700 In: 3020 [P.O.:1020; I.V.:2000] Out: 1800 [Urine:1600; Blood:200] Intake/Output this shift:    Incision/Wound:DRESSINGS INTACT.  SOME MILD DRAINAGE FROM UMBILICAL DRESSING.  SORE   Lab Results:   Recent Labs  04/24/13 1655 04/25/13 0530  WBC 17.3* 11.1*  HGB 13.5 12.4*  HCT 38.3* 35.8*  PLT 147* 143*   BMET  Recent Labs  04/24/13 1655 04/25/13 0530  NA  --  132*  K  --  4.3  CL  --  96  CO2  --  24  GLUCOSE  --  122*  BUN  --  7  CREATININE 0.93 0.85  CALCIUM  --  9.0   PT/INR No results found for this basename: LABPROT, INR,  in the last 72 hours ABG No results found for this basename: PHART, PCO2, PO2, HCO3,  in the last 72 hours  Studies/Results: No results found.  Anti-infectives: Anti-infectives   Start     Dose/Rate Route Frequency Ordered Stop   04/24/13 0600  ceFAZolin (ANCEF) IVPB 2 g/50 mL premix     2 g 100 mL/hr over 30 Minutes Intravenous On call to O.R. 04/23/13 1432 04/24/13 0939      Assessment/Plan: s/p Procedure(s) with comments: LAPAROSCOPIC CHOLECYSTECTOMY WITH INTRAOPERATIVE CHOLANGIOGRAM (N/A) - attempted CHOLECYSTECTOMY LYSIS OF ADHESION (N/A) D/C PCA OOB ADVANCE DIET SL IV HOPEFULLY HOME IN NEXT 24-48 HOURS   LOS: 1 day    Evans Levee A. 04/25/2013

## 2013-04-25 NOTE — Op Note (Signed)
NAMEWEYMAN, BOGDON              ACCOUNT NO.:  [id number]  MEDICAL RECORD NO.:  [id number]  LOCATION:  6N10C                        FACILITY:  [facility]  PHYSICIAN:  Marcello Moores A. Amber Guthridge, M.D.DATE OF BIRTH:  24-Dec-1950  DATE OF PROCEDURE:  04/24/2013 DATE OF DISCHARGE:                              OPERATIVE REPORT   PREOPERATIVE DIAGNOSIS:  History of biliary colic and gallstones.  POSTOPERATIVE DIAGNOSIS:  History of biliary colic and gallstones.  PROCEDURE:  Attempted laparoscopic converted to open cholecystectomy.  SURGEON:  Marcello Moores A. Takeyla Million, M.D.  ASSISTANT:  Imogene Burn. Tsuei, M.D.  ESTIMATED BLOOD LOSS:  200 mL.  IV FLUIDS:  1.5 L crystalloid.  DRAINS:  None.  SPECIMEN:  Gallbladder to pathology.  INDICATIONS FOR PROCEDURE:  The patient is a pleasant 63 year old male, who had a history of biliary colic like symptoms and was found to have gallstones on ultrasound examination.  After being examined and seen the patient, we discussed options of management of this both past medical and surgical.  He wished to have his gallbladder removed after lengthy discussion in the office.  We discussed laparoscopic approach.  He has a history of previous exploratory laparotomy and I commented to him that an open approach may be necessary depending on adhesions and other intraoperative things that we may encounter.  He was aware of this. Risk of bleeding, infection, common bile duct injury, bleeding, open procedure, organ injury, wound complications, potential hernia with an open procedure, and other procedures were discussed at great length including ERCP and the potential for cholangiogram as well.  After discussion of the above.  He agreed to proceed.  DESCRIPTION OF PROCEDURE:  The patient met in the holding area. Questions were answered.  He was then taken back to the OR table and placed supine where general anesthesia was initiated easily.  His abdomen was then prepped and draped  in sterile fashion.  He received 2 g of Ancef.  Time-out was done.  Procedure was verified.  We elected to place a right lower quadrant OptiView port since he had a previous midline incision from laparotomy many years ago.  The patient does not remember what exactly was done.  A 5-mm Optiview port was placed in the right lower quadrant under direct vision without difficulty.  We then able to establish pneumoperitoneum to 15 mmHg of CO2.  Laparoscopy was done.  He had dense right upper quadrant adhesions from previous upper abdominal laparotomy noted.  I then placed 2 other 5 mm ports; one in the left upper quadrant and second in the left lower quadrant and attempted to do adhesiolysis.  We spent about an hour and 15 minutes lysing adhesions and the omentum was densely adherent to the undersurface of the upper abdominal wall.  The stomach was densely adherent as well.  It took some time, but we were able to get the stomach down off the anterior abdominal wall as well as the omentum. Small bleeders were controlled with cautery and clips.  I was then able to get to the edge of the right lobe of the liver.  This was stuck up on the anterior abdominal wall and taken down with cautery.  Once I  did all this, I was able to identify the stomach and duodenum and the duodenal sweep was seen.  Unfortunately, I could not identify the gallbladder due to its significant dense adhesive disease in the fact that the transverse and right colon were densely adherent to the undersurface of the liver.  We tried to dissect this off, but again could not see any evidence of the gallbladder at that point.  Given the dense adhesions, I felt conversion to open procedure was necessary for better visualization.  We then removed the laparoscopic equipment with CO2 escape and passed everything off the field.  Open instruments were then opened and brought up on the field.  Right subcostal incision was made. We entered  the abdominal cavity easily.  I inspected the trocar insertion sites and saw no evidence of bowel injury from the laparoscopic portion of the procedure.  Again, the colon was densely adherent to the undersurface of the right lobe of liver, and again I could not easily find the gallbladder.  I dissected this away under direct vision, and found the dome, which was well below the backside of the right colon.  I was then able to carefully dissect the right colon off the gallbladder, which took some time.  Once I was able to fully expose the gallbladder, I then took the gallbladder in a dome down fashion off the liver bed.  Cystic artery was controlled between clips once I encountered this.  I took the gallbladder all the way down to the cystic duct itself.  There were no other tubular structures we encountered.  I placed a right angle across the cystic duct as it entered the gallbladder and amputated the gallbladder and passed it off the field.  I closed this with 3-0 Vicryl in a single clip.  Hemostasis was achieved.  Small piece of Surgicel snow was placed in the gallbladder bed.  I reinspected the omentum, we re-did his initial adhesiolysis.  There were still some small bleeders controlled with clips and 3-0 Vicryl sutures and cautery.  This was done until hemostatic.  The stomach appeared uninjured from the procedure.  The liver was not bleeding and it looked within normal limits without signs of cirrhosis.  The transverse colon and right colon looked uninjured upon close examination.  We irrigated until clear.  The fascia was then closed with running #1 PDS to close in 2 layers.  We closed the umbilical trocar site that we used with a 0 Vicryl as well.  Once this was done, the skin incisions was closed with staples.  Dry dressings were applied.  All final counts of sponge, needle, and instruments were found to be correct at this portion of the case.  The patient was then awoke,  extubated, taken to recovery in satisfactory condition.  All final counts were found to be correct x2.     Isidoro Santillana A. Samella Lucchetti, M.D.     TAC/MEDQ  D:  04/24/2013  T:  04/25/2013  Job:  [id number]

## 2013-04-25 NOTE — Progress Notes (Signed)
UR completed. Patient changed to inpatient- open choly- required IV PCA and IVF

## 2013-04-25 NOTE — Progress Notes (Signed)
Patient unable to void after multiple attempts.Bladder scanned for 483. In and out cath per protocol for 500 cc of yellow urine.Will continue to monitor patient urine output.

## 2013-04-26 ENCOUNTER — Encounter (HOSPITAL_COMMUNITY): Payer: Self-pay | Admitting: Surgery

## 2013-04-26 LAB — COMPREHENSIVE METABOLIC PANEL
ALK PHOS: 56 U/L (ref 39–117)
ALT: 18 U/L (ref 0–53)
AST: 16 U/L (ref 0–37)
Albumin: 3.3 g/dL — ABNORMAL LOW (ref 3.5–5.2)
BILIRUBIN TOTAL: 1.1 mg/dL (ref 0.3–1.2)
BUN: 7 mg/dL (ref 6–23)
CHLORIDE: 96 meq/L (ref 96–112)
CO2: 27 meq/L (ref 19–32)
Calcium: 9 mg/dL (ref 8.4–10.5)
Creatinine, Ser: 0.87 mg/dL (ref 0.50–1.35)
GLUCOSE: 116 mg/dL — AB (ref 70–99)
Potassium: 4.6 mEq/L (ref 3.7–5.3)
SODIUM: 134 meq/L — AB (ref 137–147)
Total Protein: 6.2 g/dL (ref 6.0–8.3)

## 2013-04-26 LAB — CBC
HCT: 34.4 % — ABNORMAL LOW (ref 39.0–52.0)
HEMOGLOBIN: 11.9 g/dL — AB (ref 13.0–17.0)
MCH: 33.6 pg (ref 26.0–34.0)
MCHC: 34.6 g/dL (ref 30.0–36.0)
MCV: 97.2 fL (ref 78.0–100.0)
Platelets: 127 10*3/uL — ABNORMAL LOW (ref 150–400)
RBC: 3.54 MIL/uL — AB (ref 4.22–5.81)
RDW: 14.1 % (ref 11.5–15.5)
WBC: 11.4 10*3/uL — ABNORMAL HIGH (ref 4.0–10.5)

## 2013-04-26 MED ORDER — DOCUSATE SODIUM 100 MG PO CAPS
100.0000 mg | ORAL_CAPSULE | Freq: Two times a day (BID) | ORAL | Status: DC
  Administered 2013-04-26 – 2013-05-01 (×11): 100 mg via ORAL
  Filled 2013-04-26 (×9): qty 1

## 2013-04-26 MED ORDER — POLYETHYLENE GLYCOL 3350 17 G PO PACK
17.0000 g | PACK | Freq: Two times a day (BID) | ORAL | Status: DC
  Administered 2013-04-26 – 2013-05-01 (×11): 17 g via ORAL
  Filled 2013-04-26 (×14): qty 1

## 2013-04-26 MED ORDER — DEXTROSE IN LACTATED RINGERS 5 % IV SOLN
INTRAVENOUS | Status: DC
  Administered 2013-04-26 – 2013-04-27 (×2): via INTRAVENOUS

## 2013-04-26 MED ORDER — MAGNESIUM HYDROXIDE 400 MG/5ML PO SUSP
30.0000 mL | Freq: Every day | ORAL | Status: DC
  Administered 2013-04-26 – 2013-05-01 (×6): 30 mL via ORAL
  Filled 2013-04-26 (×6): qty 30

## 2013-04-26 MED ORDER — KETOROLAC TROMETHAMINE 30 MG/ML IJ SOLN
30.0000 mg | Freq: Four times a day (QID) | INTRAMUSCULAR | Status: AC | PRN
  Administered 2013-04-26 – 2013-04-27 (×2): 30 mg via INTRAVENOUS
  Filled 2013-04-26 (×2): qty 1

## 2013-04-26 MED ORDER — BISACODYL 10 MG RE SUPP
10.0000 mg | Freq: Every day | RECTAL | Status: DC | PRN
  Administered 2013-04-26: 10 mg via RECTAL
  Filled 2013-04-26: qty 1

## 2013-04-26 NOTE — Progress Notes (Signed)
2 Days Post-Op  Subjective: feels tight.  No flatus or stool.    Objective: Vital signs in last 24 hours: Temp:  [98.5 F (36.9 C)-99.4 F (37.4 C)] 99.4 F (37.4 C) (03/11 2101) Pulse Rate:  [68-82] 74 (03/11 2101) Resp:  [16-18] 18 (03/11 2101) BP: (114-134)/(59-65) 128/60 mmHg (03/11 2101) SpO2:  [95 %-97 %] 97 % (03/11 2101) Weight:  [230 lb (104.327 kg)] 230 lb (104.327 kg) (03/11 0800) Last BM Date: 04/24/13  Intake/Output from previous day: 03/11 0701 - 03/12 0700 In: 462 [P.O.:462] Out: 3750 [Urine:3750] Intake/Output this shift: Total I/O In: -  Out: 400 [Urine:400]  Incision/Wound:wounds intact.  Distended with quiet abdomen sore but no peritonitis.   Lab Results:   Recent Labs  04/24/13 1655 04/25/13 0530  WBC 17.3* 11.1*  HGB 13.5 12.4*  HCT 38.3* 35.8*  PLT 147* 143*   BMET  Recent Labs  04/24/13 1655 04/25/13 0530  NA  --  132*  K  --  4.3  CL  --  96  CO2  --  24  GLUCOSE  --  122*  BUN  --  7  CREATININE 0.93 0.85  CALCIUM  --  9.0   PT/INR No results found for this basename: LABPROT, INR,  in the last 72 hours ABG No results found for this basename: PHART, PCO2, PO2, HCO3,  in the last 72 hours  Studies/Results: No results found.  Anti-infectives: Anti-infectives   Start     Dose/Rate Route Frequency Ordered Stop   04/24/13 0600  ceFAZolin (ANCEF) IVPB 2 g/50 mL premix     2 g 100 mL/hr over 30 Minutes Intravenous On call to O.R. 04/23/13 1432 04/24/13 0939      Assessment/Plan: s/p Procedure(s) with comments: LAPAROSCOPIC CHOLECYSTECTOMY WITH INTRAOPERATIVE CHOLANGIOGRAM (N/A) - attempted CHOLECYSTECTOMY LYSIS OF ADHESION (N/A) Help with BM Recheck labs in am OOB KUB shows stool and colonic gas  LOS: 2 days    Lillyrose Reitan A. 04/26/2013

## 2013-04-27 ENCOUNTER — Inpatient Hospital Stay (HOSPITAL_COMMUNITY): Payer: BC Managed Care – PPO

## 2013-04-27 LAB — COMPREHENSIVE METABOLIC PANEL
ALBUMIN: 3.4 g/dL — AB (ref 3.5–5.2)
ALK PHOS: 55 U/L (ref 39–117)
ALT: 20 U/L (ref 0–53)
AST: 41 U/L — AB (ref 0–37)
BUN: 6 mg/dL (ref 6–23)
CO2: 23 mEq/L (ref 19–32)
Calcium: 9 mg/dL (ref 8.4–10.5)
Chloride: 92 mEq/L — ABNORMAL LOW (ref 96–112)
Creatinine, Ser: 0.71 mg/dL (ref 0.50–1.35)
GFR calc Af Amer: >90 mL/min (ref >90)
GFR calc non Af Amer: >90 mL/min (ref >90)
Glucose, Bld: 155 mg/dL — ABNORMAL HIGH (ref 70–99)
POTASSIUM: 5.1 meq/L (ref 3.7–5.3)
SODIUM: 131 meq/L — AB (ref 137–147)
Total Bilirubin: 1.1 mg/dL (ref 0.3–1.2)
Total Protein: 6.8 g/dL (ref 6.0–8.3)

## 2013-04-27 LAB — CBC
HCT: 34 % — ABNORMAL LOW (ref 39.0–52.0)
HEMOGLOBIN: 12 g/dL — AB (ref 13.0–17.0)
MCH: 33.4 pg (ref 26.0–34.0)
MCHC: 35.3 g/dL (ref 30.0–36.0)
MCV: 94.7 fL (ref 78.0–100.0)
PLATELETS: 154 10*3/uL (ref 150–400)
RBC: 3.59 MIL/uL — ABNORMAL LOW (ref 4.22–5.81)
RDW: 13.8 % (ref 11.5–15.5)
WBC: 12.8 10*3/uL — ABNORMAL HIGH (ref 4.0–10.5)

## 2013-04-27 MED ORDER — MILK AND MOLASSES ENEMA
1.0000 | Freq: Once | RECTAL | Status: AC
  Administered 2013-04-27: 250 mL via RECTAL
  Filled 2013-04-27: qty 250

## 2013-04-27 MED ORDER — SORBITOL 70 % SOLN
960.0000 mL | TOPICAL_OIL | Freq: Once | ORAL | Status: DC
  Filled 2013-04-27: qty 240

## 2013-04-27 MED ORDER — PHENOL 1.4 % MT LIQD
1.0000 | OROMUCOSAL | Status: DC | PRN
  Filled 2013-04-27: qty 177

## 2013-04-27 MED ORDER — SIMETHICONE 80 MG PO CHEW
200.0000 mg | CHEWABLE_TABLET | Freq: Four times a day (QID) | ORAL | Status: DC | PRN
  Administered 2013-04-27: 200 mg via ORAL
  Filled 2013-04-27: qty 3

## 2013-04-27 MED ORDER — DEXTROSE-NACL 5-0.9 % IV SOLN
INTRAVENOUS | Status: DC
  Administered 2013-04-27 – 2013-05-01 (×8): via INTRAVENOUS

## 2013-04-27 NOTE — Progress Notes (Signed)
3 Days Post-Op  Subjective: Pt vomited NGT in place no flatus or BM  Objective: Vital signs in last 24 hours: Temp:  [98.3 F (36.8 C)] 98.3 F (36.8 C) (03/12 2310) Pulse Rate:  [76-81] 76 (03/12 2310) Resp:  [18] 18 (03/12 2310) BP: (148)/(71-80) 148/80 mmHg (03/12 2310) SpO2:  [93 %-99 %] 93 % (03/12 2310) Last BM Date: 04/24/13  Intake/Output from previous day: 03/12 0701 - 03/13 0700 In: 2198.3 [P.O.:480; I.V.:1718.3] Out: 2250 [Urine:1400; Emesis/NG output:850] Intake/Output this shift:    Incision/Wound:SOME BLISTERING FROM TAPE otherwise C/D/I   Lab Results:   Recent Labs  04/26/13 0650 04/27/13 0343  WBC 11.4* 12.8*  HGB 11.9* 12.0*  HCT 34.4* 34.0*  PLT 127* 154   BMET  Recent Labs  04/26/13 0650 04/27/13 0343  NA 134* 131*  K 4.6 5.1  CL 96 92*  CO2 27 23  GLUCOSE 116* 155*  BUN 7 6  CREATININE 0.87 0.71  CALCIUM 9.0 9.0   PT/INR No results found for this basename: LABPROT, INR,  in the last 72 hours ABG No results found for this basename: PHART, PCO2, PO2, HCO3,  in the last 72 hours  Studies/Results: Dg Abd 1 View  04/26/2013   CLINICAL DATA Abdominal pain and distension today's post cholecystectomy  EXAM ABDOMEN - 1 VIEW  COMPARISON None.  FINDINGS There is a moderate amount of stool within the right colon with stool and gas in the right colon and transverse colon. There is no evidence of a small or large bowel obstruction. There are numerous surgical clips present as well as skin staples. The bony structures exhibit no acute abnormalities.  IMPRESSION There is no evidence of a small or large bowel obstruction or ileus. There is a moderate stool burden in the right colon.  SIGNATURE  Electronically Signed   By: David  Martinique   On: 04/26/2013 07:56    Anti-infectives: Anti-infectives   Start     Dose/Rate Route Frequency Ordered Stop   04/24/13 0600  ceFAZolin (ANCEF) IVPB 2 g/50 mL premix     2 g 100 mL/hr over 30 Minutes Intravenous On  call to O.R. 04/23/13 1432 04/24/13 0939      Assessment/Plan: s/p Procedure(s) with comments: LAPAROSCOPIC CHOLECYSTECTOMY WITH INTRAOPERATIVE CHOLANGIOGRAM (N/A) - attempted CHOLECYSTECTOMY LYSIS OF ADHESION (N/A) Ileus  NGT in place KUB in am Ambulate CT if no better in next 24 - 48 hours Ice ok  LOS: 3 days    Jimena Wieczorek A. 04/27/2013

## 2013-04-28 ENCOUNTER — Inpatient Hospital Stay (HOSPITAL_COMMUNITY): Payer: BC Managed Care – PPO

## 2013-04-28 LAB — COMPREHENSIVE METABOLIC PANEL
ALT: 16 U/L (ref 0–53)
AST: 16 U/L (ref 0–37)
Albumin: 3.1 g/dL — ABNORMAL LOW (ref 3.5–5.2)
Alkaline Phosphatase: 55 U/L (ref 39–117)
BILIRUBIN TOTAL: 0.9 mg/dL (ref 0.3–1.2)
BUN: 5 mg/dL — AB (ref 6–23)
CO2: 26 mEq/L (ref 19–32)
Calcium: 8.8 mg/dL (ref 8.4–10.5)
Chloride: 95 mEq/L — ABNORMAL LOW (ref 96–112)
Creatinine, Ser: 0.71 mg/dL (ref 0.50–1.35)
GFR calc non Af Amer: >90 mL/min (ref >90)
GLUCOSE: 141 mg/dL — AB (ref 70–99)
Potassium: 4.4 mEq/L (ref 3.7–5.3)
Sodium: 134 mEq/L — ABNORMAL LOW (ref 137–147)
TOTAL PROTEIN: 6.4 g/dL (ref 6.0–8.3)

## 2013-04-28 LAB — CBC
HEMATOCRIT: 33.4 % — AB (ref 39.0–52.0)
Hemoglobin: 11.9 g/dL — ABNORMAL LOW (ref 13.0–17.0)
MCH: 33.7 pg (ref 26.0–34.0)
MCHC: 35.6 g/dL (ref 30.0–36.0)
MCV: 94.6 fL (ref 78.0–100.0)
Platelets: 161 10*3/uL (ref 150–400)
RBC: 3.53 MIL/uL — ABNORMAL LOW (ref 4.22–5.81)
RDW: 14 % (ref 11.5–15.5)
WBC: 10.9 10*3/uL — ABNORMAL HIGH (ref 4.0–10.5)

## 2013-04-28 NOTE — Progress Notes (Signed)
Patient ID: Zachary Kim, male   DOB: 09-23-1950, 63 y.o.   MRN: [id number] [facility] Surgery Progress Note:   4 Days Post-Op  Subjective: Mental status is clear but patient is uncomfortable and sitting up.   Objective: Vital signs in last 24 hours: Temp:  [98.3 F (36.8 C)-98.6 F (37 C)] 98.4 F (36.9 C) (03/14 0634) Pulse Rate:  [72-90] 72 (03/14 0634) Resp:  [18] 18 (03/14 0634) BP: (144-158)/(76-78) 154/78 mmHg (03/14 0634) SpO2:  [94 %-97 %] 96 % (03/14 0634)  Intake/Output from previous day: 03/13 0701 - 03/14 0700 In: 1826.7 [I.V.:1826.7] Out: 1225 [Urine:625; Emesis/NG output:600] Intake/Output this shift:    Physical Exam: Work of breathing is restricted; splinting and ronchi.  Suspect RLL atelectasis could be causing ileus.    Lab Results:  Results for orders placed during the hospital encounter of 04/24/13 (from the past 48 hour(s))  CBC     Status: Abnormal   Collection Time    04/27/13  3:43 AM      Result Value Ref Range   WBC 12.8 (*) 4.0 - 10.5 K/uL   RBC 3.59 (*) 4.22 - 5.81 MIL/uL   Hemoglobin 12.0 (*) 13.0 - 17.0 g/dL   HCT 34.0 (*) 39.0 - 52.0 %   MCV 94.7  78.0 - 100.0 fL   MCH 33.4  26.0 - 34.0 pg   MCHC 35.3  30.0 - 36.0 g/dL   RDW 13.8  11.5 - 15.5 %   Platelets 154  150 - 400 K/uL  COMPREHENSIVE METABOLIC PANEL     Status: Abnormal   Collection Time    04/27/13  3:43 AM      Result Value Ref Range   Sodium 131 (*) 137 - 147 mEq/L   Potassium 5.1  3.7 - 5.3 mEq/L   Comment: HEMOLYSIS AT THIS LEVEL MAY AFFECT RESULT   Chloride 92 (*) 96 - 112 mEq/L   CO2 23  19 - 32 mEq/L   Glucose, Bld 155 (*) 70 - 99 mg/dL   BUN 6  6 - 23 mg/dL   Creatinine, Ser 0.71  0.50 - 1.35 mg/dL   Calcium 9.0  8.4 - 10.5 mg/dL   Total Protein 6.8  6.0 - 8.3 g/dL   Albumin 3.4 (*) 3.5 - 5.2 g/dL   AST 41 (*) 0 - 37 U/L   Comment: HEMOLYSIS AT THIS LEVEL MAY AFFECT RESULT   ALT 20  0 - 53 U/L   Alkaline Phosphatase 55  39 - 117 U/L   Comment: HEMOLYSIS  AT THIS LEVEL MAY AFFECT RESULT   Total Bilirubin 1.1  0.3 - 1.2 mg/dL   GFR calc non Af Amer >90  >90 mL/min   GFR calc Af Amer >90  >90 mL/min   Comment: (NOTE)     The eGFR has been calculated using the CKD EPI equation.     This calculation has not been validated in all clinical situations.     eGFR's persistently <90 mL/min signify possible Chronic Kidney     Disease.  CBC     Status: Abnormal   Collection Time    04/28/13  1:30 AM      Result Value Ref Range   WBC 10.9 (*) 4.0 - 10.5 K/uL   RBC 3.53 (*) 4.22 - 5.81 MIL/uL   Hemoglobin 11.9 (*) 13.0 - 17.0 g/dL   HCT 33.4 (*) 39.0 - 52.0 %   MCV 94.6  78.0 - 100.0 fL  MCH 33.7  26.0 - 34.0 pg   MCHC 35.6  30.0 - 36.0 g/dL   RDW 14.0  11.5 - 15.5 %   Platelets 161  150 - 400 K/uL  COMPREHENSIVE METABOLIC PANEL     Status: Abnormal   Collection Time    04/28/13  1:30 AM      Result Value Ref Range   Sodium 134 (*) 137 - 147 mEq/L   Potassium 4.4  3.7 - 5.3 mEq/L   Chloride 95 (*) 96 - 112 mEq/L   CO2 26  19 - 32 mEq/L   Glucose, Bld 141 (*) 70 - 99 mg/dL   BUN 5 (*) 6 - 23 mg/dL   Creatinine, Ser 0.71  0.50 - 1.35 mg/dL   Calcium 8.8  8.4 - 10.5 mg/dL   Total Protein 6.4  6.0 - 8.3 g/dL   Albumin 3.1 (*) 3.5 - 5.2 g/dL   AST 16  0 - 37 U/L   ALT 16  0 - 53 U/L   Alkaline Phosphatase 55  39 - 117 U/L   Total Bilirubin 0.9  0.3 - 1.2 mg/dL   GFR calc non Af Amer >90  >90 mL/min   GFR calc Af Amer >90  >90 mL/min   Comment: (NOTE)     The eGFR has been calculated using the CKD EPI equation.     This calculation has not been validated in all clinical situations.     eGFR's persistently <90 mL/min signify possible Chronic Kidney     Disease.    Radiology/Results: Dg Abd Portable 1v  04/27/2013   CLINICAL DATA:  Pain.  Recent surgery.  EXAM: PORTABLE ABDOMEN - 1 VIEW  COMPARISON:  DG ABD 1 VIEW dated 04/25/2013  FINDINGS: Surgical staples and clips noted over the abdomen. Diffuse colonic distention is noted. Colonic  distention up to 10 cm is present. Although colonic obstruction cannot be excluded these changes are most likely secondary to colonic ileus. However given the colonic distention, CT of the abdomen and pelvis should be considered for further evaluation. Lucencies noted over the right colon most likely represents stool as opposed to pneumatosis. Degenerative changes lumbar spine and both hips.  IMPRESSION: Interim prominent distention of the colon, most likely secondary to colonic ileus. CT of the and abdomen and pelvis can be obtained for further evaluation.   Electronically Signed   By: Marcello Moores  Register   On: 04/27/2013 14:39    Anti-infectives: Anti-infectives   Start     Dose/Rate Route Frequency Ordered Stop   04/24/13 0600  ceFAZolin (ANCEF) IVPB 2 g/50 mL premix     2 g 100 mL/hr over 30 Minutes Intravenous On call to O.R. 04/23/13 1432 04/24/13 0939      Assessment/Plan: Problem List: Patient Active Problem List   Diagnosis Date Noted  . Gallstones 04/24/2013    Abdominal xray shows colonic ileus.  May be secondary to pulmonary splinting and atelectasis.  Will check CXR today and work on incentive spirometry.  4 Days Post-Op    LOS: 4 days   Matt B. Hassell Done, MD, [facility] [phone] beeper [phone]  04/28/2013 9:16 AM

## 2013-04-29 ENCOUNTER — Inpatient Hospital Stay (HOSPITAL_COMMUNITY): Payer: BC Managed Care – PPO

## 2013-04-29 DIAGNOSIS — Z9049 Acquired absence of other specified parts of digestive tract: Secondary | ICD-10-CM

## 2013-04-29 LAB — CBC WITH DIFFERENTIAL/PLATELET
BASOS PCT: 0 % (ref 0–1)
Basophils Absolute: 0 10*3/uL (ref 0.0–0.1)
EOS PCT: 3 % (ref 0–5)
Eosinophils Absolute: 0.3 10*3/uL (ref 0.0–0.7)
HEMATOCRIT: 32.6 % — AB (ref 39.0–52.0)
Hemoglobin: 11.4 g/dL — ABNORMAL LOW (ref 13.0–17.0)
Lymphocytes Relative: 9 % — ABNORMAL LOW (ref 12–46)
Lymphs Abs: 0.9 10*3/uL (ref 0.7–4.0)
MCH: 33.3 pg (ref 26.0–34.0)
MCHC: 35 g/dL (ref 30.0–36.0)
MCV: 95.3 fL (ref 78.0–100.0)
MONO ABS: 1.1 10*3/uL — AB (ref 0.1–1.0)
Monocytes Relative: 10 % (ref 3–12)
NEUTROS ABS: 8.1 10*3/uL — AB (ref 1.7–7.7)
Neutrophils Relative %: 78 % — ABNORMAL HIGH (ref 43–77)
Platelets: 162 10*3/uL (ref 150–400)
RBC: 3.42 MIL/uL — ABNORMAL LOW (ref 4.22–5.81)
RDW: 14 % (ref 11.5–15.5)
WBC: 10.4 10*3/uL (ref 4.0–10.5)

## 2013-04-29 LAB — BASIC METABOLIC PANEL
BUN: 9 mg/dL (ref 6–23)
CO2: 23 mEq/L (ref 19–32)
CREATININE: 0.7 mg/dL (ref 0.50–1.35)
Calcium: 8.8 mg/dL (ref 8.4–10.5)
Chloride: 98 mEq/L (ref 96–112)
GFR calc non Af Amer: >90 mL/min (ref >90)
Glucose, Bld: 126 mg/dL — ABNORMAL HIGH (ref 70–99)
Potassium: 4.1 mEq/L (ref 3.7–5.3)
SODIUM: 135 meq/L — AB (ref 137–147)

## 2013-04-29 MED ORDER — IOHEXOL 300 MG/ML  SOLN
100.0000 mL | Freq: Once | INTRAMUSCULAR | Status: AC | PRN
  Administered 2013-04-29: 100 mL via INTRAVENOUS

## 2013-04-29 MED ORDER — METOCLOPRAMIDE HCL 5 MG/ML IJ SOLN: 5.0000 mg | Freq: Four times a day (QID) | INTRAMUSCULAR | Status: DC | PRN

## 2013-04-29 MED ORDER — BISACODYL 10 MG RE SUPP
10.0000 mg | Freq: Every day | RECTAL | Status: DC
  Administered 2013-04-29 – 2013-05-01 (×3): 10 mg via RECTAL
  Filled 2013-04-29 (×3): qty 1

## 2013-04-29 MED ORDER — IOHEXOL 300 MG/ML  SOLN
25.0000 mL | INTRAMUSCULAR | Status: AC
  Administered 2013-04-29 (×2): 25 mL via ORAL

## 2013-04-29 NOTE — Progress Notes (Signed)
Patient's sister who is a nurse asked me for x-ray results and what are they planning to do next, requested for me to call MD and ask if they can do CT scan instead and that its been taking too long. MD made aware and was still waiting for chest x-ray results, but was okay to order CT, told family about it and said that it would be good to wait for results first. Then got chest x-ray results, called MD on call . Continued to encourage patient to ambulate and use incentive spirometer, advanced NGT as ordered. Family was okay with no further questions.

## 2013-04-29 NOTE — Progress Notes (Signed)
Patient ID: Zachary Kim, male   DOB: 1950-06-28, 63 y.o.   MRN: [id number] [facility] Surgery Progress Note:   5 Days Post-Op  Subjective: Mental status is clear.  Patient is a little stubborn.  No good flatus yet Objective: Vital signs in last 24 hours: Temp:  [97.5 F (36.4 C)-99 F (37.2 C)] 99 F (37.2 C) (03/15 0642) Pulse Rate:  [67-76] 67 (03/15 0642) Resp:  [16-18] 16 (03/15 0642) BP: (139-150)/(75-88) 139/75 mmHg (03/15 0642) SpO2:  [95 %-98 %] 97 % (03/15 0642)  Intake/Output from previous day: 03/14 0701 - 03/15 0700 In: 2400 [I.V.:2400] Out: 1125 [Urine:850; Emesis/NG output:275] Intake/Output this shift:    Physical Exam: Work of breathing is not labored.  Breath sounds are equal bilaterally.  Upper ronchi as before.  Abdomen is distended and firm but non tender.    Lab Results:  Results for orders placed during the hospital encounter of 04/24/13 (from the past 48 hour(s))  CBC     Status: Abnormal   Collection Time    04/28/13  1:30 AM      Result Value Ref Range   WBC 10.9 (*) 4.0 - 10.5 K/uL   RBC 3.53 (*) 4.22 - 5.81 MIL/uL   Hemoglobin 11.9 (*) 13.0 - 17.0 g/dL   HCT 33.4 (*) 39.0 - 52.0 %   MCV 94.6  78.0 - 100.0 fL   MCH 33.7  26.0 - 34.0 pg   MCHC 35.6  30.0 - 36.0 g/dL   RDW 14.0  11.5 - 15.5 %   Platelets 161  150 - 400 K/uL  COMPREHENSIVE METABOLIC PANEL     Status: Abnormal   Collection Time    04/28/13  1:30 AM      Result Value Ref Range   Sodium 134 (*) 137 - 147 mEq/L   Potassium 4.4  3.7 - 5.3 mEq/L   Chloride 95 (*) 96 - 112 mEq/L   CO2 26  19 - 32 mEq/L   Glucose, Bld 141 (*) 70 - 99 mg/dL   BUN 5 (*) 6 - 23 mg/dL   Creatinine, Ser 0.71  0.50 - 1.35 mg/dL   Calcium 8.8  8.4 - 10.5 mg/dL   Total Protein 6.4  6.0 - 8.3 g/dL   Albumin 3.1 (*) 3.5 - 5.2 g/dL   AST 16  0 - 37 U/L   ALT 16  0 - 53 U/L   Alkaline Phosphatase 55  39 - 117 U/L   Total Bilirubin 0.9  0.3 - 1.2 mg/dL   GFR calc non Af Amer >90  >90 mL/min   GFR  calc Af Amer >90  >90 mL/min   Comment: (NOTE)     The eGFR has been calculated using the CKD EPI equation.     This calculation has not been validated in all clinical situations.     eGFR's persistently <90 mL/min signify possible Chronic Kidney     Disease.    Radiology/Results: Dg Abd 1 View  04/28/2013   CLINICAL DATA:  Ileus.  Recent open cholecystectomy.  EXAM: ABDOMEN - 1 VIEW  COMPARISON:  04/27/2013  FINDINGS: There is marked gaseous distention of the proximal to mid colon that shows no significant interval change compared to 04/27/2013. Cecal diameter measures up to approximately 10 cm. The visualized portion of the sigmoid colon contains gas but is not pathologically distended. There is definite rectal gas.  There is a band of gas projecting over the upper mid abdomen at  the level of T11/T12. Pneumoperitoneum cannot be excluded.  There are no dilated small bowel loops. Skin staples project over the right upper quadrant and mid abdomen. Cholecystectomy clips are seen.  IMPRESSION: 1. No significant change in colonic distention. Cecal diameter measures up to 10 cm. Findings likely reflect colonic ileus in the postoperative setting. Rectal gas is present. 2. Cannot exclude pneumoperitoneum. If further evaluation is desired, consider a right side up decubitus view and or upright view of the abdomen for further evaluation. These results will be called to the ordering clinician or representative by the Radiologist Assistant, and communication documented in the PACS Dashboard.   Electronically Signed   By: Curlene Dolphin M.D.   On: 04/28/2013 11:18   Dg Chest Port 1 View  04/28/2013   CLINICAL DATA:  Splinting. Suspect right lower lobe atelectasis. L distension discuss that marked colonic distention seen on abdominal radiograph today. Evaluate for pneumoperitoneum.  EXAM: PORTABLE CHEST - 1 VIEW  COMPARISON:  04/28/2013  FINDINGS: AP portable view of the chest with the patient in semi-erect position  is performed. No free intraperitoneal air is seen beneath the hemidiaphragm. The gas contour seen in the upper abdomen on today's abdomen radiographs, previously dictated, was likely gas within the stomach. The heart size and mediastinal contours are within normal limits. Both lungs are clear. There is bibasilar atelectasis. Nasogastric tube appears to terminate in the distal esophagus, somewhat difficult to visualize clearly. No nasogastric tube is seen with in the stomach. The visualized skeletal structures are unremarkable.  IMPRESSION: 1. Bibasilar atelectasis. 2. No evidence of free intraperitoneal air. 3. Nasogastric tube terminates in the distal esophagus. Consider advancing.   Electronically Signed   By: Curlene Dolphin M.D.   On: 04/28/2013 17:10   Dg Abd Portable 1v  04/27/2013   CLINICAL DATA:  Pain.  Recent surgery.  EXAM: PORTABLE ABDOMEN - 1 VIEW  COMPARISON:  DG ABD 1 VIEW dated 04/25/2013  FINDINGS: Surgical staples and clips noted over the abdomen. Diffuse colonic distention is noted. Colonic distention up to 10 cm is present. Although colonic obstruction cannot be excluded these changes are most likely secondary to colonic ileus. However given the colonic distention, CT of the abdomen and pelvis should be considered for further evaluation. Lucencies noted over the right colon most likely represents stool as opposed to pneumatosis. Degenerative changes lumbar spine and both hips.  IMPRESSION: Interim prominent distention of the colon, most likely secondary to colonic ileus. CT of the and abdomen and pelvis can be obtained for further evaluation.   Electronically Signed   By: Marcello Moores  Register   On: 04/27/2013 14:39    Anti-infectives: Anti-infectives   Start     Dose/Rate Route Frequency Ordered Stop   04/24/13 0600  ceFAZolin (ANCEF) IVPB 2 g/50 mL premix     2 g 100 mL/hr over 30 Minutes Intravenous On call to O.R. 04/23/13 1432 04/24/13 0939      Assessment/Plan: Problem  List: Patient Active Problem List   Diagnosis Date Noted  . S/P cholecystectomy March 20125 04/29/2013    I was paged directly yesterday afternoon and discussed family concerns about obtaining a CT of the abdomen.  At that time the results of the CXR were not available;  I ordered CT abd/pelvis but was not done.  I discussed this with the patient and his wife this morning and since he still has not had significant decompression then will go ahead and order scan for this morning.  Lab  also will be checked.   5 Days Post-Op    LOS: 5 days   Matt B. Hassell Done, MD, [facility] [phone] beeper [phone]  04/29/2013 8:16 AM

## 2013-04-29 NOTE — Progress Notes (Signed)
Pt coughing, NGT came out.  Pt requests that the NGT not be re-inserted at this time.  Will monitor and check with MD.

## 2013-04-29 NOTE — Progress Notes (Addendum)
Pt denies nausea at this time, will discuss NGT re-insertion with MD on am rounds as per pt request.

## 2013-04-30 ENCOUNTER — Inpatient Hospital Stay (HOSPITAL_COMMUNITY): Payer: BC Managed Care – PPO

## 2013-04-30 LAB — CBC
HCT: 31.6 % — ABNORMAL LOW (ref 39.0–52.0)
Hemoglobin: 11.1 g/dL — ABNORMAL LOW (ref 13.0–17.0)
MCH: 33.4 pg (ref 26.0–34.0)
MCHC: 35.1 g/dL (ref 30.0–36.0)
MCV: 95.2 fL (ref 78.0–100.0)
PLATELETS: 170 10*3/uL (ref 150–400)
RBC: 3.32 MIL/uL — AB (ref 4.22–5.81)
RDW: 13.8 % (ref 11.5–15.5)
WBC: 8.4 10*3/uL (ref 4.0–10.5)

## 2013-04-30 LAB — COMPREHENSIVE METABOLIC PANEL
ALK PHOS: 53 U/L (ref 39–117)
ALT: 28 U/L (ref 0–53)
AST: 29 U/L (ref 0–37)
Albumin: 2.8 g/dL — ABNORMAL LOW (ref 3.5–5.2)
BILIRUBIN TOTAL: 0.9 mg/dL (ref 0.3–1.2)
BUN: 8 mg/dL (ref 6–23)
CHLORIDE: 97 meq/L (ref 96–112)
CO2: 24 meq/L (ref 19–32)
Calcium: 8.7 mg/dL (ref 8.4–10.5)
Creatinine, Ser: 0.76 mg/dL (ref 0.50–1.35)
GFR calc Af Amer: >90 mL/min (ref >90)
GFR calc non Af Amer: >90 mL/min (ref >90)
Glucose, Bld: 119 mg/dL — ABNORMAL HIGH (ref 70–99)
POTASSIUM: 4.2 meq/L (ref 3.7–5.3)
Sodium: 133 mEq/L — ABNORMAL LOW (ref 137–147)
Total Protein: 5.7 g/dL — ABNORMAL LOW (ref 6.0–8.3)

## 2013-04-30 MED ORDER — NEOSTIGMINE METHYLSULFATE 1 MG/ML IJ SOLN
0.5000 mg | Freq: Four times a day (QID) | INTRAMUSCULAR | Status: DC
  Administered 2013-04-30 – 2013-05-01 (×5): 0.5 mg via INTRAMUSCULAR
  Filled 2013-04-30 (×10): qty 0.5

## 2013-04-30 NOTE — Progress Notes (Signed)
6 Days Post-Op  Subjective: Moving bowels passing gas.   Objective: Vital signs in last 24 hours: Temp:  [97.4 F (36.3 C)-98.8 F (37.1 C)] 98.3 F (36.8 C) (03/16 0603) Pulse Rate:  [65-70] 65 (03/16 0603) Resp:  [20] 20 (03/16 0603) BP: (129-141)/(69-73) 130/69 mmHg (03/16 0603) SpO2:  [95 %-99 %] 99 % (03/16 0603) Last BM Date: 04/29/13  Intake/Output from previous day: 03/15 0701 - 03/16 0700 In: 2438.3 [I.V.:2438.3] Out: 501 [Urine:500; Stool:1] Intake/Output this shift:    Incision/Wound:still distended but less so.  Wounds intact.   Lab Results:   Recent Labs  04/29/13 1110 04/30/13 0744  WBC 10.4 8.4  HGB 11.4* 11.1*  HCT 32.6* 31.6*  PLT 162 170   BMET  Recent Labs  04/29/13 1110 04/30/13 0744  NA 135* 133*  K 4.1 4.2  CL 98 97  CO2 23 24  GLUCOSE 126* 119*  BUN 9 8  CREATININE 0.70 0.76  CALCIUM 8.8 8.7   PT/INR No results found for this basename: LABPROT, INR,  in the last 72 hours ABG No results found for this basename: PHART, PCO2, PO2, HCO3,  in the last 72 hours  Studies/Results: Dg Abd 1 View  04/30/2013   CLINICAL DATA:  Status post open cholecystectomy.  EXAM: ABDOMEN - 1 VIEW  COMPARISON:  CT ABD/PELVIS W CM dated 04/29/2013; DG ABD 1 VIEW dated 04/28/2013; DG ABD 1 VIEW dated 04/25/2013  FINDINGS: Postoperative colonic ileus pattern has improved. There is no evidence of small bowel dilatation.  IMPRESSION: Improved colonic ileus with decreased distension of the colon identified.   Electronically Signed   By: Aletta Edouard M.D.   On: 04/30/2013 08:30   Ct Abdomen Pelvis W Contrast  04/29/2013   CLINICAL DATA:  Abdominal pain.  Open cholecystectomy 5 days ago.  EXAM: CT ABDOMEN AND PELVIS WITH CONTRAST  TECHNIQUE: Multidetector CT imaging of the abdomen and pelvis was performed using the standard protocol following bolus administration of intravenous contrast.  CONTRAST:  111mL OMNIPAQUE IOHEXOL 300 MG/ML SOLN, 1 OMNIPAQUE IOHEXOL 300  MG/ML SOLN  COMPARISON:  DG ABD 1 VIEW dated 04/28/2013  FINDINGS: Curvilinear bilateral basilar atelectasis is noted. Trace right greater than left pleural fluid is present. Heart size is normal.  Anterior abdominal wall incision changes are noted over the right upper quadrant. There is a small amount of a amorphous soft tissue density within the gallbladder fossa with central gas adjacent to cholecystectomy clips, with an appearance typical for hemostatic material placed at surgery per review of the operative note. There is trace fluid and gas immediately subjacent to the right upper quadrant abdominal incisional site as well as a few trace foci of intra-abdominal gas, but no incisional or intra-abdominal fluid collections are identified. The liver, spleen, adrenal glands, pancreas, and kidneys are unremarkable. Minimal stranding/fluid noted tracking to the mesenteric. No focal collection otherwise.  The cecum is distended, and the appendix is visualized as a dilated tubular blind-ending structure arising from the cecum extending to the right lower quadrant, with dilatation measuring 2.4 cm and trace surrounding periappendiceal stranding. No periappendiceal focal fluid collection is identified. No bowel wall thickening is identified. The bladder is unremarkable. No pelvic lymphadenopathy. Trace ascites tracks along the left greater than right paracolic gutters to the pelvis.  No acute osseous abnormality.  IMPRESSION: No evidence for complication after cholecystectomy.  The appendix is abnormally dilated to 2.4 cm with internal hypodensity. Differential considerations include mucocele, mucinous appendiceal adenocarcinoma, or possibly appendicitis  but appendicitis is not the most favored diagnosis because no wall thickening is visualized and the surrounding minimal stranding and fluid are more likely related to recent cholecystectomy.  These results were called by telephone at the time of interpretation on 04/29/2013  at 2:10 PM to Dr. Donnie Mesa, who verbally acknowledged these results.   Electronically Signed   By: Conchita Paris M.D.   On: 04/29/2013 14:19   Dg Chest Port 1 View  04/28/2013   CLINICAL DATA:  Splinting. Suspect right lower lobe atelectasis. L distension discuss that marked colonic distention seen on abdominal radiograph today. Evaluate for pneumoperitoneum.  EXAM: PORTABLE CHEST - 1 VIEW  COMPARISON:  04/28/2013  FINDINGS: AP portable view of the chest with the patient in semi-erect position is performed. No free intraperitoneal air is seen beneath the hemidiaphragm. The gas contour seen in the upper abdomen on today's abdomen radiographs, previously dictated, was likely gas within the stomach. The heart size and mediastinal contours are within normal limits. Both lungs are clear. There is bibasilar atelectasis. Nasogastric tube appears to terminate in the distal esophagus, somewhat difficult to visualize clearly. No nasogastric tube is seen with in the stomach. The visualized skeletal structures are unremarkable.  IMPRESSION: 1. Bibasilar atelectasis. 2. No evidence of free intraperitoneal air. 3. Nasogastric tube terminates in the distal esophagus. Consider advancing.   Electronically Signed   By: Curlene Dolphin M.D.   On: 04/28/2013 17:10    Anti-infectives: Anti-infectives   Start     Dose/Rate Route Frequency Ordered Stop   04/24/13 0600  ceFAZolin (ANCEF) IVPB 2 g/50 mL premix     2 g 100 mL/hr over 30 Minutes Intravenous On call to O.R. 04/23/13 1432 04/24/13 0939      Assessment/Plan: s/p Procedure(s) with comments: LAPAROSCOPIC CHOLECYSTECTOMY WITH INTRAOPERATIVE CHOLANGIOGRAM (N/A) - attempted CHOLECYSTECTOMY LYSIS OF ADHESION (N/A) Advance diet Try neostigmine 0.5 mg IM/[place] every 6 hours    LOS: 6 days    Libi Corso A. 04/30/2013

## 2013-05-01 LAB — CREATININE, SERUM
Creatinine, Ser: 0.86 mg/dL (ref 0.50–1.35)
GFR calc Af Amer: >90 mL/min (ref >90)

## 2013-05-01 MED ORDER — OXYCODONE-ACETAMINOPHEN 5-325 MG PO TABS: 1.0000 | ORAL_TABLET | ORAL | Status: DC | PRN

## 2013-05-01 MED ORDER — SODIUM CHLORIDE 0.9 % IJ SOLN: 3.0000 mL | INTRAMUSCULAR | Status: DC | PRN

## 2013-05-01 MED ORDER — POLYETHYLENE GLYCOL 3350 17 G PO PACK: 17.0000 g | PACK | Freq: Two times a day (BID) | ORAL | Status: DC

## 2013-05-01 MED ORDER — SODIUM CHLORIDE 0.9 % IJ SOLN: 3.0000 mL | Freq: Two times a day (BID) | INTRAMUSCULAR | Status: DC

## 2013-05-01 MED ORDER — OXYCODONE-ACETAMINOPHEN 5-325 MG PO TABS
1.0000 | ORAL_TABLET | ORAL | Status: DC | PRN
  Administered 2013-05-01: 2 via ORAL

## 2013-05-01 MED ORDER — SODIUM CHLORIDE 0.9 % IV SOLN: 250.0000 mL | INTRAVENOUS | Status: DC | PRN

## 2013-05-01 NOTE — Discharge Summary (Signed)
Physician Discharge Summary  Patient ID: Zachary Kim MRN: [id number] DOB/AGE: 08/20/1950 63 y.o.  Admit date: 04/24/2013 Discharge date: 05/01/2013  Admission Diagnoses: chlolelithiasis  Discharge Diagnoses: same Colonic ileus complicating a procedure Patient Active Problem List   Diagnosis Date Noted  . S/P cholecystectomy March 20125 04/29/2013   Principal Problem:   S/P cholecystectomy March 20125   Discharged Condition: fair  Hospital Course: pt developed post operative colonic ileus that took 5 days to resolve. CT obtained of abdomen on POD 5 that showed colon distention involving the cecum.   This got better with contrast and neostigmine.  Bowels started moving and had multiple BM. Still with distended abdomen but much less pain and return of bowel function.  Advanced diet.  Wound looked clean.  Pain well controlled.      Consults: None  Significant Diagnostic Studies: see chart  Treatments: surgery: open cholecystectomy IVF Neostigmine NGT  Discharge Exam: Blood pressure 138/70, pulse 75, temperature 98 F (36.7 C), temperature source Oral, resp. rate 17, height 6\' 2"  (1.88 m), weight 230 lb (104.327 kg), SpO2 99.00%. Resp: clear to auscultation bilaterally Incision/Wound:C/D/I DISTENDED WITH BOWEL SOUNDS.  LOOKS THE SAME AS HE DID PREOP  Disposition: Final discharge disposition not confirmed   Future Appointments Provider Department Dept Phone   05/14/2013 9:00 AM Marcello Moores A. Markiyah Gahm, [facility], [place] [phone]       Medication List    ASK your doctor about these medications       CENTRUM SILVER PO  Take 1 tablet by mouth daily.     HYDROcodone-acetaminophen 5-325 MG per tablet  Commonly known as:  NORCO/VICODIN  Take 1 tablet by mouth every 6 (six) hours as needed for moderate pain.     ibuprofen 200 MG tablet  Commonly known as:  ADVIL,MOTRIN  Take 400 mg by mouth every 6 (six) hours as needed for moderate pain.     pantoprazole  20 MG tablet  Commonly known as:  PROTONIX  Take 1 tablet (20 mg total) by mouth daily.     PROVENTIL HFA 108 (90 BASE) MCG/ACT inhaler  Generic drug:  albuterol  Inhale 1-2 puffs into the lungs every 6 (six) hours as needed for wheezing or shortness of breath.         Signed: Raylyn Speckman A. 05/01/2013, 7:34 AM

## 2013-05-01 NOTE — Progress Notes (Signed)
7 Days Post-Op  Subjective: Feels better.  Moving bowels.  Wants to go home.    Objective: Vital signs in last 24 hours: Temp:  [97.7 F (36.5 C)-98 F (36.7 C)] 98 F (36.7 C) (03/17 0620) Pulse Rate:  [71-75] 75 (03/17 0620) Resp:  [16-17] 17 (03/17 0620) BP: (138-143)/(70-73) 138/70 mmHg (03/17 0620) SpO2:  [99 %] 99 % (03/17 0620) Last BM Date: 04/30/13  Intake/Output from previous day: 03/16 0701 - 03/17 0700 In: 3193.3 [P.O.:720; I.V.:2473.3] Out: 700 [Urine:700] Intake/Output this shift:    Incision/Wound:C/D/I.  Some small blistering notes from adhesive. Still distended but non tender.  Lab Results:   Recent Labs  04/29/13 1110 04/30/13 0744  WBC 10.4 8.4  HGB 11.4* 11.1*  HCT 32.6* 31.6*  PLT 162 170   BMET  Recent Labs  04/29/13 1110 04/30/13 0744  NA 135* 133*  K 4.1 4.2  CL 98 97  CO2 23 24  GLUCOSE 126* 119*  BUN 9 8  CREATININE 0.70 0.76  CALCIUM 8.8 8.7   PT/INR No results found for this basename: LABPROT, INR,  in the last 72 hours ABG No results found for this basename: PHART, PCO2, PO2, HCO3,  in the last 72 hours  Studies/Results: Dg Abd 1 View  04/30/2013   CLINICAL DATA:  Status post open cholecystectomy.  EXAM: ABDOMEN - 1 VIEW  COMPARISON:  CT ABD/PELVIS W CM dated 04/29/2013; DG ABD 1 VIEW dated 04/28/2013; DG ABD 1 VIEW dated 04/25/2013  FINDINGS: Postoperative colonic ileus pattern has improved. There is no evidence of small bowel dilatation.  IMPRESSION: Improved colonic ileus with decreased distension of the colon identified.   Electronically Signed   By: Aletta Edouard M.D.   On: 04/30/2013 08:30   Ct Abdomen Pelvis W Contrast  04/29/2013   CLINICAL DATA:  Abdominal pain.  Open cholecystectomy 5 days ago.  EXAM: CT ABDOMEN AND PELVIS WITH CONTRAST  TECHNIQUE: Multidetector CT imaging of the abdomen and pelvis was performed using the standard protocol following bolus administration of intravenous contrast.  CONTRAST:  194mL  OMNIPAQUE IOHEXOL 300 MG/ML SOLN, 1 OMNIPAQUE IOHEXOL 300 MG/ML SOLN  COMPARISON:  DG ABD 1 VIEW dated 04/28/2013  FINDINGS: Curvilinear bilateral basilar atelectasis is noted. Trace right greater than left pleural fluid is present. Heart size is normal.  Anterior abdominal wall incision changes are noted over the right upper quadrant. There is a small amount of a amorphous soft tissue density within the gallbladder fossa with central gas adjacent to cholecystectomy clips, with an appearance typical for hemostatic material placed at surgery per review of the operative note. There is trace fluid and gas immediately subjacent to the right upper quadrant abdominal incisional site as well as a few trace foci of intra-abdominal gas, but no incisional or intra-abdominal fluid collections are identified. The liver, spleen, adrenal glands, pancreas, and kidneys are unremarkable. Minimal stranding/fluid noted tracking to the mesenteric. No focal collection otherwise.  The cecum is distended, and the appendix is visualized as a dilated tubular blind-ending structure arising from the cecum extending to the right lower quadrant, with dilatation measuring 2.4 cm and trace surrounding periappendiceal stranding. No periappendiceal focal fluid collection is identified. No bowel wall thickening is identified. The bladder is unremarkable. No pelvic lymphadenopathy. Trace ascites tracks along the left greater than right paracolic gutters to the pelvis.  No acute osseous abnormality.  IMPRESSION: No evidence for complication after cholecystectomy.  The appendix is abnormally dilated to 2.4 cm with internal hypodensity.  Differential considerations include mucocele, mucinous appendiceal adenocarcinoma, or possibly appendicitis but appendicitis is not the most favored diagnosis because no wall thickening is visualized and the surrounding minimal stranding and fluid are more likely related to recent cholecystectomy.  These results were  called by telephone at the time of interpretation on 04/29/2013 at 2:10 PM to Dr. Donnie Mesa, who verbally acknowledged these results.   Electronically Signed   By: Conchita Paris M.D.   On: 04/29/2013 14:19    Anti-infectives: Anti-infectives   Start     Dose/Rate Route Frequency Ordered Stop   04/24/13 0600  ceFAZolin (ANCEF) IVPB 2 g/50 mL premix     2 g 100 mL/hr over 30 Minutes Intravenous On call to O.R. 04/23/13 1432 04/24/13 0939      Assessment/Plan: s/p Procedure(s) with comments: LAPAROSCOPIC CHOLECYSTECTOMY WITH INTRAOPERATIVE CHOLANGIOGRAM (N/A) - attempted CHOLECYSTECTOMY LYSIS OF ADHESION (N/A) Advance diet Home later today if he tolerates diet Needs colonoscopy at some point since he has not had one.   LOS: 7 days    Zachary Mcmurtry A. 05/01/2013

## 2013-05-01 NOTE — Progress Notes (Signed)
DC home with wife, verbally understood DC instructions, no questions asked. 

## 2013-05-01 NOTE — Discharge Summary (Signed)
Pt tolerating diet. Moving bowels and feels well. Will discharge. Follow up next week for staples.

## 2013-05-01 NOTE — Discharge Instructions (Signed)
[facility]      [facility] [phone]  OPEN ABDOMINAL SURGERY: POST OP INSTRUCTIONS  Always review your discharge instruction sheet given to you by the facility where your surgery was performed.  IF YOU HAVE DISABILITY OR FAMILY LEAVE FORMS, YOU MUST BRING THEM TO THE OFFICE FOR PROCESSING.  PLEASE DO NOT GIVE THEM TO YOUR DOCTOR.  1. A prescription for pain medication may be given to you upon discharge.  Take your pain medication as prescribed, if needed.  If narcotic pain medicine is not needed, then you may take acetaminophen (Tylenol) or ibuprofen (Advil) as needed. 2. Take your usually prescribed medications unless otherwise directed. 3. If you need a refill on your pain medication, please contact your pharmacy. They will contact our office to request authorization.  Prescriptions will not be filled after 5pm or on week-ends. 4. You should follow a light diet the first few days after arrival home, such as soup and crackers, pudding, etc.unless your doctor has advised otherwise. A high-fiber, low fat diet can be resumed as tolerated.   Be sure to include lots of fluids daily. Most patients will experience some swelling and bruising on the chest and neck area.  Ice packs will help.  Swelling and bruising can take several days to resolve 5. Most patients will experience some swelling and bruising in the area of the incision. Ice pack will help. Swelling and bruising can take several days to resolve..  6. It is common to experience some constipation if taking pain medication after surgery.  Increasing fluid intake and taking a stool softener will usually help or prevent this problem from occurring.  A mild laxative (Milk of Magnesia or Miralax) should be taken according to package directions if there are no bowel movements after 48 hours. 7.  You may have steri-strips (small skin tapes) in place directly over the incision.  These strips should be left on the skin for 7-10 days.  If your  surgeon used skin glue on the incision, you may shower in 24 hours.  The glue will flake off over the next 2-3 weeks.  Any sutures or staples will be removed at the office during your follow-up visit. You may find that a light gauze bandage over your incision may keep your staples from being rubbed or pulled. You may shower and replace the bandage daily. 8. ACTIVITIES:  You may resume regular (light) daily activities beginning the next day--such as daily self-care, walking, climbing stairs--gradually increasing activities as tolerated.  You may have sexual intercourse when it is comfortable.  Refrain from any heavy lifting or straining until approved by your doctor. a. You may drive when you no longer are taking prescription pain medication, you can comfortably wear a seatbelt, and you can safely maneuver your car and apply brakes b. Return to Work: ___________________________________ 9. You should see your doctor in the office for a follow-up appointment approximately two weeks after your surgery.  Make sure that you call for this appointment within a day or two after you arrive home to insure a convenient appointment time. OTHER INSTRUCTIONS:  _____________________________________________________________ _____________________________________________________________  WHEN TO CALL YOUR DOCTOR: 1. Fever over 101.0 2. Inability to urinate 3. Nausea and/or vomiting 4. Extreme swelling or bruising 5. Continued bleeding from incision. 6. Increased pain, redness, or drainage from the incision. 7. Difficulty swallowing or breathing 8. Muscle cramping or spasms. 9. Numbness or tingling in hands or feet or around lips.  The clinic staff is available to   answer your questions during regular business hours.  Please don't hesitate to call and ask to speak to one of the nurses if you have concerns.  For further questions, please visit [email or web address]   

## 2013-05-04 ENCOUNTER — Encounter (INDEPENDENT_AMBULATORY_CARE_PROVIDER_SITE_OTHER): Payer: Self-pay | Admitting: General Surgery

## 2013-05-04 ENCOUNTER — Ambulatory Visit (INDEPENDENT_AMBULATORY_CARE_PROVIDER_SITE_OTHER): Payer: BC Managed Care – PPO | Admitting: General Surgery

## 2013-05-04 VITALS — BP 130/84 | HR 83 | Temp 98.6°F | Resp 18 | Ht 74.0 in | Wt 239.0 lb

## 2013-05-04 DIAGNOSIS — Z4802 Encounter for removal of sutures: Secondary | ICD-10-CM

## 2013-05-04 NOTE — Patient Instructions (Signed)
Patient came in today and I removed staple from his gallbladder surgery, and I placed steri streps over the wound and gave the wife the wraps to get the tape removed off his skin and I told him that he can get in the shower if he wanted to and pat dry the streps and I told them both that next Friday they can take off the steri streps and keep his up coming apt with Dr Brantley Stage

## 2013-05-14 ENCOUNTER — Encounter (INDEPENDENT_AMBULATORY_CARE_PROVIDER_SITE_OTHER): Payer: Self-pay | Admitting: Surgery

## 2013-05-14 ENCOUNTER — Ambulatory Visit (INDEPENDENT_AMBULATORY_CARE_PROVIDER_SITE_OTHER): Payer: BC Managed Care – PPO | Admitting: Surgery

## 2013-05-14 VITALS — BP 128/78 | HR 75 | Temp 98.8°F | Resp 16 | Ht 74.0 in | Wt 228.0 lb

## 2013-05-14 DIAGNOSIS — Z9889 Other specified postprocedural states: Secondary | ICD-10-CM

## 2013-05-14 MED ORDER — POLYETHYLENE GLYCOL 3350 17 G PO PACK: 17.0000 g | PACK | Freq: Two times a day (BID) | ORAL | Status: DC

## 2013-05-14 NOTE — Patient Instructions (Signed)
May drive.  No lifting above 15 lbs for 2 weeks then lift as tolerated. No tractor.

## 2013-05-14 NOTE — Progress Notes (Signed)
Patient returns after laparoscopic converted to open cholecystectomy. He had a prolonged ileus which resolved after a week. He is doing okay at this point.  Exam: Right upper quadrant abdominal incision clean dry and intact without signs of infection. Mild distention but not tender.  Impression: Status post open cholecystectomy  Plan: Slowly improving. Resume driving. May lift up to 15 pounds for 2 weeks and then full activity. Return in 6 weeks for final recheck.

## 2013-05-28 ENCOUNTER — Other Ambulatory Visit (INDEPENDENT_AMBULATORY_CARE_PROVIDER_SITE_OTHER): Payer: Self-pay

## 2013-05-28 DIAGNOSIS — K6389 Other specified diseases of intestine: Secondary | ICD-10-CM

## 2013-05-28 NOTE — Progress Notes (Signed)
Received call from Dr Nyoka Cowden radiology about CT scan done while in the hospital during open cholecystectomy. Very dilated cecum at the time and dilated appendix.  Had colonic ileus clinically.  In need of colonoscopy.  Scheduled to come back next month and will refer for colonoscopy prior to that.  May need to repeat his CT scan as well. If finding persists,  Would need laparoscopy laparotomy.

## 2013-05-29 ENCOUNTER — Telehealth (INDEPENDENT_AMBULATORY_CARE_PROVIDER_SITE_OTHER): Payer: Self-pay

## 2013-05-29 ENCOUNTER — Encounter: Payer: Self-pay | Admitting: *Deleted

## 2013-05-29 NOTE — Telephone Encounter (Signed)
Spoke to pt wife and gave her appt info for [facility] for Friday 4/17 at 10:30.

## 2013-05-30 ENCOUNTER — Other Ambulatory Visit (INDEPENDENT_AMBULATORY_CARE_PROVIDER_SITE_OTHER): Payer: Self-pay | Admitting: Surgery

## 2013-06-01 ENCOUNTER — Ambulatory Visit: Payer: BC Managed Care – PPO | Admitting: Gastroenterology

## 2013-06-04 ENCOUNTER — Ambulatory Visit: Payer: BC Managed Care – PPO | Admitting: Nurse Practitioner

## 2013-06-25 ENCOUNTER — Encounter (INDEPENDENT_AMBULATORY_CARE_PROVIDER_SITE_OTHER): Payer: Self-pay | Admitting: Surgery

## 2013-06-25 ENCOUNTER — Ambulatory Visit (INDEPENDENT_AMBULATORY_CARE_PROVIDER_SITE_OTHER): Payer: BC Managed Care – PPO | Admitting: Surgery

## 2013-06-25 VITALS — BP 126/80 | HR 77 | Temp 98.0°F | Resp 14 | Ht 74.0 in | Wt 231.0 lb

## 2013-06-25 DIAGNOSIS — G8929 Other chronic pain: Secondary | ICD-10-CM

## 2013-06-25 DIAGNOSIS — R1031 Right lower quadrant pain: Secondary | ICD-10-CM

## 2013-06-25 MED ORDER — PANTOPRAZOLE SODIUM 20 MG PO TBEC: 40.0000 mg | DELAYED_RELEASE_TABLET | Freq: Every day | ORAL | Status: DC

## 2013-06-25 NOTE — Progress Notes (Signed)
Patient returns for followup 2 months after open cholecystectomy. During his hospital admission, he developed some colonic ileus CT scan was done. It showed a dilated appendix and a questionable mass involving the cecum or appendix. He is recovered from his ileus. I scheduled him for a colonoscopy as an outpatient and he canceled. He also requires a CT scan for followup of the changes around his appendix and cecum. He has not been very compliant. His wife brings him in today. He is moving his bowels fairly well. There is no blood in the stool. He feels tired though. He is supposed to start testosterone injections. He does have some mild right lower quadrant abdominal pain which is chronic. He still has some distention.  Exam: Abdominal incisions well healed. Mild abdominal distention. No rebound or guarding. No mass. Bowel sounds normal.  Impression: 2 months status post laparoscopic converted to open cholecystectomy for gallstones  CT finding of distended cecum, appendix and questionable mass of the appendix  Plan: He is agreed to proceed with colonoscopy. We'll repeat the CT scan as well to further evaluate his colonic distention and appendiceal abnormality. Explaining the importance of followup and the rationale for doing the above studies. I explained potential risk of malignancy in this situation as well. He and his wife both agree with the above mentioned plan.

## 2013-06-25 NOTE — Patient Instructions (Signed)
Please get colonoscopy and CT scan to follow up colon  Issues. Further recs after.  Ok to take Depo shots.

## 2013-06-27 ENCOUNTER — Ambulatory Visit: Payer: BC Managed Care – PPO | Admitting: Gastroenterology

## 2013-06-28 ENCOUNTER — Ambulatory Visit
Admission: RE | Admit: 2013-06-28 | Discharge: 2013-06-28 | Disposition: A | Discharge status: Home / Self Care | Payer: BC Managed Care – PPO | Source: Ambulatory Visit | Attending: Surgery | Admitting: Surgery

## 2013-06-28 DIAGNOSIS — R1031 Right lower quadrant pain: Principal | ICD-10-CM

## 2013-06-28 DIAGNOSIS — G8929 Other chronic pain: Secondary | ICD-10-CM

## 2013-06-28 MED ORDER — IOHEXOL 300 MG/ML  SOLN
125.0000 mL | Freq: Once | INTRAMUSCULAR | Status: AC | PRN
  Administered 2013-06-28: 125 mL via INTRAVENOUS

## 2013-06-29 ENCOUNTER — Ambulatory Visit (INDEPENDENT_AMBULATORY_CARE_PROVIDER_SITE_OTHER): Payer: BC Managed Care – PPO | Admitting: Gastroenterology

## 2013-06-29 ENCOUNTER — Encounter: Payer: Self-pay | Admitting: Gastroenterology

## 2013-06-29 VITALS — BP 150/70 | HR 76 | Ht 74.0 in | Wt 232.0 lb

## 2013-06-29 DIAGNOSIS — K388 Other specified diseases of appendix: Secondary | ICD-10-CM

## 2013-06-29 DIAGNOSIS — Z1211 Encounter for screening for malignant neoplasm of colon: Secondary | ICD-10-CM

## 2013-06-29 DIAGNOSIS — K389 Disease of appendix, unspecified: Secondary | ICD-10-CM

## 2013-06-29 MED ORDER — NA SULFATE-K SULFATE-MG SULF 17.5-3.13-1.6 GM/177ML PO SOLN: ORAL | Status: DC

## 2013-06-29 NOTE — Progress Notes (Signed)
06/29/2013 Zachary Kim [id number] Oct 04, 1950   HISTORY OF PRESENT ILLNESS:  [name] who underwent open cholecystectomy in March 2015. He subsequently developed an ileus and underwent CT scan of the abdomen and pelvis on March 15, which revealed an abnormally dilated appendix to 2.4 cm with internal hypodensity, differential considerations including a mucocele, mucinous appendiceal adenocarcinoma, or possibly appendicitis but appendicitis was not favored because no wall thickening was visualized. He was apparently recommended to undergo a colonoscopy, but never proceeded with that. He was seen again by Dr. Brantley Stage earlier this week, and he recommended a repeat CT scan and referral to our office for colonoscopy. 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 He presents to our office today at Dr. Josetta Huddle referral to schedule colonoscopy. The patient states that his abdomen still feels bloated and distended since his cholecystectomy. Also has some mild RLQ abdominal discomfort.  He denies any nausea, vomiting, fevers, or chills. He is eating quite well and having bowel movements. He denies any rectal bleeding. He has never had a colonoscopy in the past.  Just of note, he was placed on an antibiotic and some prednisone for an upper respiratory infection just yesterday.  Past Medical History  Diagnosis Date  . Hyperlipidemia   . Arthritis   . GERD (gastroesophageal reflux disease)   . HOH (hard of hearing)   . Gallstones    Past Surgical History  Procedure Laterality Date  . Small intestine surgery      intestine blockage  . Back surgery      lower slipped disc  . Cholecystectomy N/A 04/24/2013    Procedure: LAPAROSCOPIC CHOLECYSTECTOMY WITH INTRAOPERATIVE CHOLANGIOGRAM;  Surgeon: [name]. Cornett, MD;  Location: [facility];  Service: General;  Laterality: N/A;  attempted  . Cholecystectomy  04/24/2013    Procedure:  CHOLECYSTECTOMY;  Surgeon: [name]. Cornett, MD;  Location: [facility];  Service: General;;  . Lysis of adhesion N/A 04/24/2013    Procedure: LYSIS OF ADHESION;  Surgeon: [name]. Cornett, MD;  Location: [facility];  Service: General;  Laterality: N/A;    reports that he quit smoking about 30 years ago. His smoking use included Cigarettes. He has a 24 pack-year smoking history. His smokeless tobacco use includes Chew. He reports that he drinks about 1.8 ounces of alcohol per week. He reports that he does not use illicit drugs. family history includes Diabetes in his mother; Heart disease in his father. There is no history of Colon cancer. Allergies  Allergen Reactions  . Other Other (See Comments)    Adhesive tape, blistered skin      Outpatient Encounter Prescriptions as of 06/29/2013  Medication Sig  . clarithromycin (BIAXIN) 500 MG tablet Take 500 mg by mouth 2 (two) times daily.   . Multiple Vitamins-Minerals (CENTRUM SILVER PO) Take 1 tablet by mouth daily.  . pantoprazole (PROTONIX) 20 MG tablet Take 2 tablets (40 mg total) by mouth daily. Take 1 tablet by mouth once daily  . predniSONE (DELTASONE) 10 MG tablet Take as directed, tapering down  . PROVENTIL HFA 108 (90 BASE) MCG/ACT inhaler Inhale 1-2 puffs into the lungs every 6 (six) hours as needed for wheezing or shortness of breath.   . [DISCONTINUED] ibuprofen (ADVIL,MOTRIN) 200 MG tablet Take 400 mg by mouth every 6 (six) hours as needed for moderate pain.  . [DISCONTINUED] polyethylene glycol (MIRALAX / GLYCOLAX) packet Take 17 g by mouth 2 (two) times daily.  REVIEW OF SYSTEMS  : All other systems reviewed and negative except where noted in the History of Present Illness.   PHYSICAL EXAM: BP 150/70  Pulse 76  Ht 6\' 2"  (1.88 m)  Wt 232 lb (105.235 kg)  BMI 29.77 kg/m2 General: Well developed white male in no acute distress Head: Normocephalic and atraumatic Eyes:  Sclerae anicteric, conjunctiva pink. Ears: Normal auditory  acuity  Lungs: Clear throughout to auscultation Heart: Regular rate and rhythm Abdomen: Soft, slightly distended.  Several abdominal incisions noted including previous laparotomy scar and recent open cholecystectomy scar.  Normal bowel sounds.  Mild right sided TTP without R/R/G.  Rectal:  Deferred.  Will be done at the time of colonoscopy. Musculoskeletal: Symmetrical with no gross deformities  Skin: No lesions on visible extremities Extremities: No edema  Neurological: Alert oriented x 4, grossly non-focal Psychological:  Alert and cooperative. 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  Referred by [facility] for colonoscopy. -Screening colonoscopy  Will schedule colonoscopy with Dr. Deatra Ina.  The risks, benefits, and alternatives were discussed with the patient and he consents to proceed.

## 2013-06-29 NOTE — Patient Instructions (Addendum)
You have been given a separate informational sheet regarding your tobacco use, the importance of quitting and local resources to help you quit. You have been scheduled for a colonoscopy with propofol. Please follow written instructions given to you at your visit today.  Please pick up your prep kit at the pharmacy within the next 1-3 days. If you use inhalers (even only as needed), please bring them with you on the day of your procedure. Your physician has requested that you go to www.startemmi.com and enter the access code given to you at your visit today. This web site gives a general overview about your procedure. However, you should still follow specific instructions given to you by our office regarding your preparation for the procedure. 

## 2013-07-10 ENCOUNTER — Ambulatory Visit (AMBULATORY_SURGERY_CENTER): Payer: BC Managed Care – PPO | Admitting: Gastroenterology

## 2013-07-10 ENCOUNTER — Encounter: Payer: Self-pay | Admitting: Gastroenterology

## 2013-07-10 VITALS — BP 162/86 | HR 70 | Temp 98.3°F | Resp 13 | Ht 74.0 in | Wt 232.0 lb

## 2013-07-10 DIAGNOSIS — K389 Disease of appendix, unspecified: Secondary | ICD-10-CM

## 2013-07-10 DIAGNOSIS — D126 Benign neoplasm of colon, unspecified: Secondary | ICD-10-CM

## 2013-07-10 DIAGNOSIS — C180 Malignant neoplasm of cecum: Secondary | ICD-10-CM

## 2013-07-10 MED ORDER — SODIUM CHLORIDE 0.9 % IV SOLN: 500.0000 mL | INTRAVENOUS | Status: DC

## 2013-07-10 NOTE — Progress Notes (Signed)
Report to pacu rn, vss, bbs=clear 

## 2013-07-10 NOTE — Patient Instructions (Signed)
YOU HAD AN ENDOSCOPIC PROCEDURE TODAY AT THE [facility]: Refer to the procedure report that was given to you for any specific questions about what was found during the examination.  If the procedure report does not answer your questions, please call your gastroenterologist to clarify.  If you requested that your care partner not be given the details of your procedure findings, then the procedure report has been included in a sealed envelope for you to review at your convenience later.  YOU SHOULD EXPECT: Some feelings of bloating in the abdomen. Passage of more gas than usual.  Walking can help get rid of the air that was put into your GI tract during the procedure and reduce the bloating. If you had a lower endoscopy (such as a colonoscopy or flexible sigmoidoscopy) you may notice spotting of blood in your stool or on the toilet paper. If you underwent a bowel prep for your procedure, then you may not have a normal bowel movement for a few days.  DIET: Your first meal following the procedure should be a light meal and then it is ok to progress to your normal diet.  A half-sandwich or bowl of soup is an example of a good first meal.  Heavy or fried foods are harder to digest and may make you feel nauseous or bloated.  Likewise meals heavy in dairy and vegetables can cause extra gas to form and this can also increase the bloating.  Drink plenty of fluids but you should avoid alcoholic beverages for 24 hours.  ACTIVITY: Your care partner should take you home directly after the procedure.  You should plan to take it easy, moving slowly for the rest of the day.  You can resume normal activity the day after the procedure however you should NOT DRIVE or use heavy machinery for 24 hours (because of the sedation medicines used during the test).    SYMPTOMS TO REPORT IMMEDIATELY: A gastroenterologist can be reached at any hour.  During normal business hours, 8:30 AM to 5:00 PM Monday through Friday,  call [phone].  After hours and on weekends, please call the GI answering service at [phone] who will take a message and have the physician on call contact you.   Following lower endoscopy (colonoscopy or flexible sigmoidoscopy):  Excessive amounts of blood in the stool  Significant tenderness or worsening of abdominal pains  Swelling of the abdomen that is new, acute  Fever of 100F or higher  FOLLOW UP: If any biopsies were taken you will be contacted by phone or by letter within the next 1-3 weeks.  Call your gastroenterologist if you have not heard about the biopsies in 3 weeks.  Our staff will call the home number listed on your records the next business day following your procedure to check on you and address any questions or concerns that you may have at that time regarding the information given to you following your procedure. This is a courtesy call and so if there is no answer at the home number and we have not heard from you through the emergency physician on call, we will assume that you have returned to your regular daily activities without incident.  SIGNATURES/CONFIDENTIALITY: You and/or your care partner have signed paperwork which will be entered into your electronic medical record.  These signatures attest to the fact that that the information above on your After Visit Summary has been reviewed and is understood.  Full responsibility of the confidentiality of this   discharge information lies with you and/or your care-partner.  Recommendations Right hemicolectomy If malignant neoplasm is identified, next colonoscopy in 1 year

## 2013-07-10 NOTE — Progress Notes (Signed)
Specimens sent "Rush". Called [facility] at [phone].

## 2013-07-10 NOTE — Op Note (Signed)
[place]  [address]. [place], [id number]   COLONOSCOPY PROCEDURE REPORT  PATIENT: Zachary Kim, Zachary Kim  MR#: [id number] BIRTHDATE: 1950-10-20 , 62  yrs. old GENDER: Male ENDOSCOPIST: Inda Castle, MD REFERRED NT:IRWERX Cornett, M.D. PROCEDURE DATE:  07/10/2013 PROCEDURE:   Colonoscopy with snare polypectomy and Colonoscopy with biopsy First Screening Colonoscopy - Avg.  risk and is 50 yrs.  old or older Yes.  Prior Negative Screening - Now for repeat screening. N/A  History of Adenoma - Now for follow-up colonoscopy & has been > or = to 3 yrs.  N/A  Polyps Removed Today? Yes. ASA CLASS:   Class II INDICATIONS:an abnormal CT.suggesting a mucocele of the appendix MEDICATIONS: MAC sedation, administered by CRNA and propofol (Diprivan) 300mg  IV  DESCRIPTION OF PROCEDURE:   After the risks benefits and alternatives of the procedure were thoroughly explained, informed consent was obtained.  A digital rectal exam revealed no abnormalities of the rectum.   The LB [id number]  endoscope was introduced through the anus and advanced to the cecum, which was identified by both the appendix and ileocecal valve. No adverse events experienced.   The quality of the prep was Suprep good  The instrument was then slowly withdrawn as the colon was fully examined.      COLON FINDINGS: The appendiceal orifice was unremarkable.  Just below the appendix are first there were 2 sessile 2 mm polyps. There was a separate 15 mm 20 mm sessile polyp in the cecum. Biopsies were taken. At the ileocecal valve there is a friable, exophytic mass.  Biopsies were taken. In the rectal vault there was a 4 mm sessile polyp.  This was removed with cold polypectomy snare and submitted to pathology. Retroflexed views revealed no abnormalities. The time to cecum=4 minutes 44 seconds.  Withdrawal time=15 minutes 20 seconds.  The scope was withdrawn and the procedure  completed. COMPLICATIONS: There were no complications.  ENDOSCOPIC IMPRESSION: 1.  mass, ileocecal valve 2.  cecal polyps 3.  rectal polyp  RECOMMENDATIONS: right hemicolectomy if malignant neoplasm is identified, colonoscopy one year  eSigned:  Inda Castle, MD 07/10/2013 2:16 PM   cc: Suzan Garibaldi, MD   PATIENT NAME:  Zachary Kim, Zachary Kim MR#: [id number]

## 2013-07-10 NOTE — Progress Notes (Signed)
Called to room to assist during endoscopic procedure.  Patient ID and intended procedure confirmed with present staff. Received instructions for my participation in the procedure from the performing physician.  

## 2013-07-11 ENCOUNTER — Telehealth (INDEPENDENT_AMBULATORY_CARE_PROVIDER_SITE_OTHER): Payer: Self-pay | Admitting: General Surgery

## 2013-07-11 ENCOUNTER — Telehealth: Payer: Self-pay

## 2013-07-11 ENCOUNTER — Encounter: Payer: Self-pay | Admitting: Gastroenterology

## 2013-07-11 NOTE — Telephone Encounter (Signed)
  Follow up Call-  Call back number 07/10/2013  Post procedure Call Back phone  # [phone]  Permission to leave phone message Yes     Patient questions:  Do you have a fever, pain , or abdominal swelling? no Pain Score  0 *  Have you tolerated food without any problems? yes  Have you been able to return to your normal activities? yes  Do you have any questions about your discharge instructions: Diet   no Medications  no Follow up visit  no  Do you have questions or concerns about your Care? no  Actions: * If pain score is 4 or above: No action needed, pain <4.  No problems per the pt. maw

## 2013-07-11 NOTE — Telephone Encounter (Signed)
Called pt wife back and told her to call Dr Kelby Fam office since he is the MD  that did the colonoscopy and biopsy. Dr Brantley Stage out of office until Friday. Will discuss any surgical needs or appt needed then.

## 2013-07-11 NOTE — Telephone Encounter (Signed)
Patient wife called and want to know the colonoscopy report that he husband had done today and they want to know the results. Please call the wife on her cell. [phone]

## 2013-07-12 ENCOUNTER — Telehealth: Payer: Self-pay | Admitting: Gastroenterology

## 2013-07-12 NOTE — Telephone Encounter (Signed)
I shared the pathology report with the patient stating that there is no malignancy.  He still will require resection of the cecum because  the polyp at the ileocecal valve cannot be removed through the colonoscope.  He appears to understand this.

## 2013-07-17 NOTE — Telephone Encounter (Signed)
LMOM for Zachary Kim to call me back. Dr Brantley Stage wants to see her husband in office to discuss surgery for her husband.

## 2013-07-17 NOTE — Telephone Encounter (Signed)
LMOM for pt to give [place] a call back. Pt was made post op appt to see Dr Brantley Stage on 08/13/13 @ 1:30 to discuss surgery

## 2013-07-20 ENCOUNTER — Ambulatory Visit (INDEPENDENT_AMBULATORY_CARE_PROVIDER_SITE_OTHER): Payer: BC Managed Care – PPO | Admitting: Surgery

## 2013-07-20 ENCOUNTER — Encounter (INDEPENDENT_AMBULATORY_CARE_PROVIDER_SITE_OTHER): Payer: Self-pay | Admitting: Surgery

## 2013-07-20 VITALS — BP 146/78 | HR 72 | Resp 16 | Ht 74.0 in | Wt 230.0 lb

## 2013-07-20 DIAGNOSIS — K6389 Other specified diseases of intestine: Secondary | ICD-10-CM | POA: Insufficient documentation

## 2013-07-20 DIAGNOSIS — K639 Disease of intestine, unspecified: Secondary | ICD-10-CM

## 2013-07-20 NOTE — Progress Notes (Signed)
Patient ID: Zachary Kim, male   DOB: 1950-08-11, 63 y.o.   MRN: [id number]  No chief complaint on file.   HPI Zachary Kim is a 63 y.o. male.  Patient returns after colonoscopy. 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 No dysplasia. Another tubular villous adenoma identified and rectum and this was completely removed which showed tubulovillous adenoma without atypia. He continues to have low-grade chronic right lower quadrant pain. He has no energy. The pain comes and goes. No nausea or vomiting. His bowels are moving. HPI  Past Medical History  Diagnosis Date  . Hyperlipidemia   . Arthritis   . GERD (gastroesophageal reflux disease)   . HOH (hard of hearing)   . Gallstones     Past Surgical History  Procedure Laterality Date  . Small intestine surgery      intestine blockage  . Back surgery      lower slipped disc  . Cholecystectomy N/A 04/24/2013    Procedure: LAPAROSCOPIC CHOLECYSTECTOMY WITH INTRAOPERATIVE CHOLANGIOGRAM;  Surgeon: [name]. Jamesyn Moorefield, MD;  Location: [facility];  Service: General;  Laterality: N/A;  attempted  . Cholecystectomy  04/24/2013    Procedure: CHOLECYSTECTOMY;  Surgeon: [name]. Nailah Luepke, MD;  Location: [facility];  Service: General;;  . Lysis of adhesion N/A 04/24/2013    Procedure: LYSIS OF ADHESION;  Surgeon: [name]. Ambrie Carte, MD;  Location: [facility];  Service: General;  Laterality: N/A;    Family History  Problem Relation Age of Onset  . Colon cancer Neg Hx   . Diabetes Mother   . Heart disease Father     had valve replacement    Social History History  Substance Use Topics  . Smoking status: Former Smoker -- 2.00 packs/day for 12 years    Types: Cigarettes    Quit date: 04/10/1983  . Smokeless tobacco: Current User    Types: Chew     Comment: chew tobacco, tobacco info given 06/29/13  . Alcohol Use: 1.8 oz/week    3 Cans of beer per week     Comment: daily    Allergies  Allergen Reactions  .  Other Other (See Comments)    Adhesive tape, blistered skin    Current Outpatient Prescriptions  Medication Sig Dispense Refill  . clarithromycin (BIAXIN) 500 MG tablet Take 500 mg by mouth 2 (two) times daily.       . Multiple Vitamins-Minerals (CENTRUM SILVER PO) Take 1 tablet by mouth daily.      . pantoprazole (PROTONIX) 20 MG tablet Take 2 tablets (40 mg total) by mouth daily. Take 1 tablet by mouth once daily  90 tablet  0  . predniSONE (DELTASONE) 10 MG tablet Take as directed, tapering down      . PROVENTIL HFA 108 (90 BASE) MCG/ACT inhaler Inhale 1-2 puffs into the lungs every 6 (six) hours as needed for wheezing or shortness of breath.        No current facility-administered medications for this visit.    Review of Systems Review of Systems  Respiratory: Negative.   Cardiovascular: Negative.   Gastrointestinal: Positive for abdominal pain and abdominal distention.    Blood pressure 146/78, pulse 72, resp. rate 16, height 6\' 2"  (1.88 m), weight 230 lb (104.327 kg).  Physical Exam Physical Exam  Constitutional: He appears well-developed and well-nourished.  HENT:  Head: Normocephalic.  Mouth/Throat: No oropharyngeal exudate.  Neck: Neck supple.  Cardiovascular: Normal rate.   Pulmonary/Chest: Effort normal and  breath sounds normal.  Abdominal: He exhibits distension. There is no tenderness.      Data Reviewed Colonoscopy shows large mass at ileocecal valve and cecum show new be tubular villous adenoma. Rectal polyp was completely removed showing tubulovillous adenoma. Neither had signs of dysplasia.  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  Laparoscopic partial colectomy discussed with the patient as well as non operative treatments. The risks of  operative management include bleeding,  Infection,  Leak of anastamosis,  Ostomy formation, open procedure,  Sepsis,  Abcess,  Hernia,  DVT,  Pulmonary complications,  Cardiovascular  complications,  Injury to ureter,  Bladder,kidney,and anesthesia risks,  And death. The patient understands.  Questions answered.   The success of the procedure is 50-100 % for treating the patients symptoms. They agree to proceed.       Rochell Mabie A. Nealie Mchatton 07/20/2013, 12:42 PM

## 2013-07-20 NOTE — Patient Instructions (Signed)
Laparoscopic Colectomy Laparoscopic colectomy is surgery to remove part or all of the large intestine (colon). This procedure is used to treat several conditions, including:  Inflammation and infection of the colon (diverticulitis).  Tumors or masses in the colon.  Inflammatory bowel disease, such as Crohn disease or ulcerative colitis. Colectomy is an option when symptoms cannot be controlled with medicines.  Bleeding from the colon that cannot be controlled by another method.  Blockage or obstruction of the colon. LET [facility] CARE PROVIDER KNOW ABOUT:  Any allergies you have.  All medicines you are taking, including vitamins, herbs, eye drops, creams, and over-the-counter medicines.  Previous problems you or members of your family have had with the use of anesthetics.  Any blood disorders you have.  Previous surgeries you have had.  Medical conditions you have. RISKS AND COMPLICATIONS Generally, this is a safe procedure. However, as with any procedure, complications can occur. Possible complications include:  Infection.  Bleeding.  Damage to other organs.  Leaking from where the colon was sewn together.  Future blockage of the small intestines from scar tissue. Another surgery may be needed to repair this. In some cases, complications such as damage to other organs or excessive bleeding may require the surgeon to convert from a laparoscopic procedure to an open procedure. This involves making a larger incision in the abdomen to perform the procedure. BEFORE THE PROCEDURE  Ask your health care provider about changing or stopping any regular medicines.  You may be prescribed an oral bowel prep. This involves drinking a large amount of medicated liquid, starting the day before your surgery. The liquid will cause you to have multiple loose stools until your stool is almost clear or light green. This cleans out your colon in preparation for the surgery.  Do not eat or  drink anything else once you have started the bowel prep, unless your health care provider tells you it is safe to do so.  You may also be given antibiotic pills to clean out your colon of bacteria. Be sure to follow the directions carefully and take the medicine at the correct time. PROCEDURE   Small monitors will be put on your body. They are used to check your heart, blood pressure, and oxygen level.  An IV access tube will be put into one of your veins. Medicine will be able to flow directly into your body through this IV tube.  You might be given a medicine to help you relax (sedative).  You will be given a medicine to make you sleep through the procedure (general anesthetic). A breathing tube may be placed into your lungs during the procedure.  A thin, flexible tube (catheter) will be placed into your bladder to collect urine.  A tube may be put in through your nose. It is called a nasogastric tube. It is used to remove stomach fluids after surgery until the intestines start working again.  Your abdomen will be filled with air so that it expands. This gives the surgeon more room to operate and makes your organs easier to see.  Several small cuts (incisions) are made in your abdomen.  A thin, lighted tube with a tiny camera on the end (laparoscope) is put through one of the small incisions. The camera on the laparoscope sends a picture to a TV screen in the operating room. This gives the surgeon a good view inside your abdomen.  Hollow tubes are put through the other small incisions in your abdomen. The tools  needed for the procedure are put through these tubes.  Clamps or staples are put on both ends of the diseased part of the colon.  The part of the intestine between the clamps or staples is removed.  If possible, the ends of the healthy colon that remain will be stitched or stapled together to allow your body to expel waste (stool).  Sometimes, the remaining colon cannot be  stitched back together. If this is the case, a colostomy is needed. For a colostomy:  An opening (stoma) to the outside of your body is made through the abdomen.  The end of the colon is brought to the opening. It is stitched to the skin.  A bag is attached to the opening. Stool will drain into this bag. The bag is removable.  The colostomy can be temporary or permanent.  The incisions from the colectomy are closed with stitches or staples. AFTER THE PROCEDURE  You will be monitored closely in a recovery area until you are stable and doing well. You will then be moved to a regular hospital room.  You will need to receive fluids through an IV tube until your bowel function has returned. This may take 1 3 days. Once your bowels are working again, you will be started on clear liquids and then advanced to solid food as tolerated.  You will be given pain medicines to control your pain. Document Released: 04/24/2002 Document Revised: 11/22/2012 Document Reviewed: 09/13/2012 [facility] Patient Information 2014 [place], [place].

## 2013-08-07 NOTE — Pre-Procedure Instructions (Addendum)
Zachary Kim  08/07/2013   Your procedure is scheduled on: Thursday, July 2.  Report to [facility] Admitting at 8:10AM.  Call this number if you have problems the morning of surgery: [phone]   Remember:   Do not eat food or drink liquids after midnight Wednesday, July 1.    Take these medicines the morning of surgery with A SIP OF WATER: pantoprazole(PROTONIX).  May use Albuterol inhaler if needed and bring it to the hospital with you.                         Take all meds until day of surgery except as listed below or per dr        Bridgette Habermann all herbel meds, nsaids (aleve,naproxen,advil,ibuprofen) 5 days prior to surgery((08/11/13) including vitamins, aspirin    Do not wear jewelry, make-up or nail polish.  Do not wear lotions, powders, or perfumes.  Men may shave face and neck.  Do not bring valuables to the hospital.                [facility] is not responsible for any belongings or valuables.               Contacts, dentures or bridgework may not be worn into surgery.  Leave suitcase in the car. After surgery it may be brought to your room.  For patients admitted to the hospital, discharge time is determined by your treatment team.                 Special Instructions: - Special Instructions: [place] - Preparing for Surgery  Before surgery, you can play an important role.  Because skin is not sterile, your skin needs to be as free of germs as possible.  You can reduce the number of germs on you skin by washing with CHG (chlorahexidine gluconate) soap before surgery.  CHG is an antiseptic cleaner which kills germs and bonds with the skin to continue killing germs even after washing.  Please DO NOT use if you have an allergy to CHG or antibacterial soaps.  If your skin becomes reddened/irritated stop using the CHG and inform your nurse when you arrive at Short Stay.  Do not shave (including legs and underarms) for at least 48 hours prior to the first CHG  shower.  You may shave your face.  Please follow these instructions carefully:   1.  Shower with CHG Soap the night before surgery and the morning of Surgery.  2.  If you choose to wash your hair, wash your hair first as usual with your normal shampoo.  3.  After you shampoo, rinse your hair and body thoroughly to remove the Shampoo.  4.  Use CHG as you would any other liquid soap.  You can apply chg directly  to the skin and wash gently with scrungie or a clean washcloth.  5.  Apply the CHG Soap to your body ONLY FROM THE NECK DOWN.  Do not use on open wounds or open sores.  Avoid contact with your eyes ears, mouth and genitals (private parts).  Wash genitals (private parts)       with your normal soap.  6.  Wash thoroughly, paying special attention to the area where your surgery will be performed.  7.  Thoroughly rinse your body with warm water from the neck down.  8.  DO NOT shower/wash with your normal soap after using and rinsing  off the CHG Soap.  9.  Pat yourself dry with a clean towel.            10.  Wear clean pajamas.            11.  Place clean sheets on your bed the night of your first shower and do not sleep with pets.  Day of Surgery  Do not apply any lotions/deodorants the morning of surgery.  Please wear clean clothes to the hospital/surgery center.   Please read over the following fact sheets that you were given: Pain Booklet, Coughing and Deep Breathing and Surgical Site Infection Prevention

## 2013-08-08 ENCOUNTER — Encounter (HOSPITAL_COMMUNITY)
Admission: RE | Admit: 2013-08-08 | Discharge: 2013-08-08 | Disposition: A | Discharge status: Home / Self Care | Payer: BC Managed Care – PPO | Source: Ambulatory Visit | Attending: Surgery | Admitting: Surgery

## 2013-08-08 ENCOUNTER — Encounter (HOSPITAL_COMMUNITY): Payer: Self-pay | Admitting: Pharmacy Technician

## 2013-08-08 ENCOUNTER — Ambulatory Visit (HOSPITAL_COMMUNITY)
Admission: RE | Admit: 2013-08-08 | Discharge: 2013-08-08 | Disposition: A | Discharge status: Home / Self Care | Payer: BC Managed Care – PPO | Source: Ambulatory Visit | Attending: Anesthesiology | Admitting: Anesthesiology

## 2013-08-08 ENCOUNTER — Encounter (HOSPITAL_COMMUNITY): Payer: Self-pay

## 2013-08-08 DIAGNOSIS — Z01812 Encounter for preprocedural laboratory examination: Secondary | ICD-10-CM | POA: Insufficient documentation

## 2013-08-08 DIAGNOSIS — Z01818 Encounter for other preprocedural examination: Secondary | ICD-10-CM | POA: Insufficient documentation

## 2013-08-08 HISTORY — DX: Unspecified asthma, uncomplicated: J45.909

## 2013-08-08 HISTORY — DX: Pneumonia, unspecified organism: J18.9

## 2013-08-08 LAB — COMPREHENSIVE METABOLIC PANEL
ALT: 26 U/L (ref 0–53)
AST: 26 U/L (ref 0–37)
Albumin: 3.7 g/dL (ref 3.5–5.2)
Alkaline Phosphatase: 66 U/L (ref 39–117)
BUN: 10 mg/dL (ref 6–23)
CALCIUM: 9.6 mg/dL (ref 8.4–10.5)
CO2: 25 meq/L (ref 19–32)
CREATININE: 0.87 mg/dL (ref 0.50–1.35)
Chloride: 99 mEq/L (ref 96–112)
GFR calc Af Amer: >90 mL/min (ref >90)
Glucose, Bld: 114 mg/dL — ABNORMAL HIGH (ref 70–99)
Potassium: 5.3 mEq/L (ref 3.7–5.3)
Sodium: 136 mEq/L — ABNORMAL LOW (ref 137–147)
Total Bilirubin: 0.5 mg/dL (ref 0.3–1.2)
Total Protein: 6.9 g/dL (ref 6.0–8.3)

## 2013-08-08 LAB — CBC WITH DIFFERENTIAL/PLATELET
BASOS ABS: 0 10*3/uL (ref 0.0–0.1)
Basophils Relative: 1 % (ref 0–1)
EOS PCT: 1 % (ref 0–5)
Eosinophils Absolute: 0.1 10*3/uL (ref 0.0–0.7)
HCT: 41.5 % (ref 39.0–52.0)
Hemoglobin: 14.5 g/dL (ref 13.0–17.0)
Lymphocytes Relative: 14 % (ref 12–46)
Lymphs Abs: 1.1 10*3/uL (ref 0.7–4.0)
MCH: 34.3 pg — ABNORMAL HIGH (ref 26.0–34.0)
MCHC: 34.9 g/dL (ref 30.0–36.0)
MCV: 98.1 fL (ref 78.0–100.0)
Monocytes Absolute: 0.8 10*3/uL (ref 0.1–1.0)
Monocytes Relative: 10 % (ref 3–12)
Neutro Abs: 6.3 10*3/uL (ref 1.7–7.7)
Neutrophils Relative %: 74 % (ref 43–77)
Platelets: 137 10*3/uL — ABNORMAL LOW (ref 150–400)
RBC: 4.23 MIL/uL (ref 4.22–5.81)
RDW: 15.5 % (ref 11.5–15.5)
WBC: 8.3 10*3/uL (ref 4.0–10.5)

## 2013-08-08 LAB — ABO/RH: ABO/RH(D): O POS

## 2013-08-08 LAB — TYPE AND SCREEN
ABO/RH(D): O POS
Antibody Screen: NEGATIVE

## 2013-08-08 NOTE — Progress Notes (Signed)
08/08/13 1017  OBSTRUCTIVE SLEEP APNEA  Have you ever been diagnosed with sleep apnea through a sleep study? No  Do you snore loudly (loud enough to be heard through closed doors)?  1  Do you often feel tired, fatigued, or sleepy during the daytime? 0  Has anyone observed you stop breathing during your sleep? 1  Do you have, or are you being treated for high blood pressure? 0  BMI more than 35 kg/m2? 0  Age over 63 years old? 1  Neck circumference greater than 40 cm/16 inches? 1 (18)  Gender: 1  Obstructive Sleep Apnea Score 5  Score 4 or greater  Results sent to PCP

## 2013-08-08 NOTE — Progress Notes (Signed)
Office called re: bowel prep.  Patient does not remember any rx or instructions for prep. Spoke with kristi at office--- they will get in touch with patient  If prep needed.

## 2013-08-13 ENCOUNTER — Telehealth (INDEPENDENT_AMBULATORY_CARE_PROVIDER_SITE_OTHER): Payer: Self-pay

## 2013-08-13 ENCOUNTER — Other Ambulatory Visit (INDEPENDENT_AMBULATORY_CARE_PROVIDER_SITE_OTHER): Payer: Self-pay

## 2013-08-13 ENCOUNTER — Encounter (INDEPENDENT_AMBULATORY_CARE_PROVIDER_SITE_OTHER): Payer: BC Managed Care – PPO | Admitting: Surgery

## 2013-08-13 NOTE — Telephone Encounter (Addendum)
Pt wife called back. She states she is looking through all the papers she was given when they were here and she does not have them. She will come pick up the instructions later this afternoon. I called the abx prescription in to his pharmacy.

## 2013-08-13 NOTE — Telephone Encounter (Signed)
LMOM> I have one day miralax prep with abx ready for patient. Dr Brantley Stage and I are pretty sure we gave them the prescription and directions for prep when they were in office. If they do not have it, I have one here for him to pick up. Im not sure if I mail it they will get it in time.

## 2013-08-13 NOTE — Telephone Encounter (Signed)
Message copied by Carlene Coria on Mon Aug 13, 2013 10:40 AM ------      Message from: Ivor Costa      Created: Wed Aug 08, 2013 10:46 AM      Regarding: Pre-Op Bowel prep       Patient unsure if he has pre-op bowel prep.  Patient scheduled for surgery on 08/16/13 for Lap partial colectomy.  Patient aware we will call him on Monday 08/13/13 to address bowel prep. ------

## 2013-08-16 ENCOUNTER — Inpatient Hospital Stay (HOSPITAL_COMMUNITY): Payer: BC Managed Care – PPO | Admitting: Certified Registered Nurse Anesthetist

## 2013-08-16 ENCOUNTER — Encounter (HOSPITAL_COMMUNITY)
Admission: RE | Disposition: A | Discharge status: Home / Self Care | Payer: Self-pay | Source: Ambulatory Visit | Attending: Surgery

## 2013-08-16 ENCOUNTER — Encounter (HOSPITAL_COMMUNITY): Payer: BC Managed Care – PPO | Admitting: Vascular Surgery

## 2013-08-16 ENCOUNTER — Inpatient Hospital Stay (HOSPITAL_COMMUNITY)
Admission: RE | Admit: 2013-08-16 | Discharge: 2013-08-22 | DRG: 331 | Disposition: A | Discharge status: Home / Self Care | Payer: BC Managed Care – PPO | Source: Ambulatory Visit | Attending: Surgery | Admitting: Surgery

## 2013-08-16 ENCOUNTER — Encounter (HOSPITAL_COMMUNITY): Payer: Self-pay | Admitting: Certified Registered Nurse Anesthetist

## 2013-08-16 DIAGNOSIS — Z9889 Other specified postprocedural states: Secondary | ICD-10-CM

## 2013-08-16 DIAGNOSIS — K388 Other specified diseases of appendix: Secondary | ICD-10-CM

## 2013-08-16 DIAGNOSIS — K620 Anal polyp: Secondary | ICD-10-CM

## 2013-08-16 DIAGNOSIS — K6389 Other specified diseases of intestine: Secondary | ICD-10-CM | POA: Diagnosis present

## 2013-08-16 DIAGNOSIS — R339 Retention of urine, unspecified: Secondary | ICD-10-CM | POA: Diagnosis not present

## 2013-08-16 DIAGNOSIS — M129 Arthropathy, unspecified: Secondary | ICD-10-CM | POA: Diagnosis present

## 2013-08-16 DIAGNOSIS — Z9049 Acquired absence of other specified parts of digestive tract: Secondary | ICD-10-CM

## 2013-08-16 DIAGNOSIS — Z5331 Laparoscopic surgical procedure converted to open procedure: Secondary | ICD-10-CM

## 2013-08-16 DIAGNOSIS — H9190 Unspecified hearing loss, unspecified ear: Secondary | ICD-10-CM | POA: Diagnosis present

## 2013-08-16 DIAGNOSIS — E785 Hyperlipidemia, unspecified: Secondary | ICD-10-CM | POA: Diagnosis present

## 2013-08-16 DIAGNOSIS — E230 Hypopituitarism: Secondary | ICD-10-CM

## 2013-08-16 DIAGNOSIS — K219 Gastro-esophageal reflux disease without esophagitis: Secondary | ICD-10-CM | POA: Diagnosis present

## 2013-08-16 DIAGNOSIS — K621 Rectal polyp: Secondary | ICD-10-CM

## 2013-08-16 DIAGNOSIS — K660 Peritoneal adhesions (postprocedural) (postinfection): Secondary | ICD-10-CM | POA: Diagnosis present

## 2013-08-16 DIAGNOSIS — D126 Benign neoplasm of colon, unspecified: Principal | ICD-10-CM | POA: Diagnosis present

## 2013-08-16 DIAGNOSIS — Z1211 Encounter for screening for malignant neoplasm of colon: Secondary | ICD-10-CM

## 2013-08-16 DIAGNOSIS — Z23 Encounter for immunization: Secondary | ICD-10-CM

## 2013-08-16 DIAGNOSIS — Z87891 Personal history of nicotine dependence: Secondary | ICD-10-CM

## 2013-08-16 HISTORY — PX: PARTIAL COLECTOMY: SHX5273

## 2013-08-16 HISTORY — PX: LAPAROSCOPIC PARTIAL COLECTOMY: SHX5907

## 2013-08-16 LAB — CBC
HCT: 44.2 % (ref 39.0–52.0)
Hemoglobin: 15.6 g/dL (ref 13.0–17.0)
MCH: 34 pg (ref 26.0–34.0)
MCHC: 35.3 g/dL (ref 30.0–36.0)
MCV: 96.3 fL (ref 78.0–100.0)
PLATELETS: 180 10*3/uL (ref 150–400)
RBC: 4.59 MIL/uL (ref 4.22–5.81)
RDW: 14.7 % (ref 11.5–15.5)
WBC: 18 10*3/uL — ABNORMAL HIGH (ref 4.0–10.5)

## 2013-08-16 LAB — CREATININE, SERUM
Creatinine, Ser: 0.91 mg/dL (ref 0.50–1.35)
GFR calc non Af Amer: 89 mL/min — ABNORMAL LOW (ref >90)

## 2013-08-16 SURGERY — LAPAROSCOPIC PARTIAL COLECTOMY
Anesthesia: General | Site: Abdomen

## 2013-08-16 MED ORDER — ONDANSETRON HCL 4 MG/2ML IJ SOLN
INTRAMUSCULAR | Status: AC
  Filled 2013-08-16: qty 2

## 2013-08-16 MED ORDER — LIDOCAINE HCL (CARDIAC) 20 MG/ML IV SOLN
INTRAVENOUS | Status: AC
  Filled 2013-08-16: qty 5

## 2013-08-16 MED ORDER — LIDOCAINE HCL (CARDIAC) 20 MG/ML IV SOLN
INTRAVENOUS | Status: DC | PRN
  Administered 2013-08-16: 100 mg via INTRAVENOUS

## 2013-08-16 MED ORDER — NALOXONE HCL 0.4 MG/ML IJ SOLN: 0.4000 mg | INTRAMUSCULAR | Status: DC | PRN

## 2013-08-16 MED ORDER — ENOXAPARIN SODIUM 40 MG/0.4ML ~~LOC~~ SOLN
40.0000 mg | SUBCUTANEOUS | Status: DC
  Administered 2013-08-17 – 2013-08-22 (×6): 40 mg via SUBCUTANEOUS
  Filled 2013-08-16 (×10): qty 0.4

## 2013-08-16 MED ORDER — HYDROMORPHONE 0.3 MG/ML IV SOLN
INTRAVENOUS | Status: AC
  Administered 2013-08-16: 13:00:00
  Filled 2013-08-16: qty 25

## 2013-08-16 MED ORDER — 0.9 % SODIUM CHLORIDE (POUR BTL) OPTIME
TOPICAL | Status: DC | PRN
  Administered 2013-08-16 (×4): 1000 mL

## 2013-08-16 MED ORDER — MIDAZOLAM HCL 5 MG/5ML IJ SOLN
INTRAMUSCULAR | Status: DC | PRN
  Administered 2013-08-16: 2 mg via INTRAVENOUS

## 2013-08-16 MED ORDER — PROPOFOL 10 MG/ML IV BOLUS
INTRAVENOUS | Status: DC | PRN
  Administered 2013-08-16: 180 mg via INTRAVENOUS
  Administered 2013-08-16: 20 mg via INTRAVENOUS

## 2013-08-16 MED ORDER — ROCURONIUM BROMIDE 100 MG/10ML IV SOLN
INTRAVENOUS | Status: DC | PRN
  Administered 2013-08-16: 5 mg via INTRAVENOUS
  Administered 2013-08-16: 10 mg via INTRAVENOUS
  Administered 2013-08-16: 50 mg via INTRAVENOUS
  Administered 2013-08-16: 10 mg via INTRAVENOUS
  Administered 2013-08-16: 5 mg via INTRAVENOUS

## 2013-08-16 MED ORDER — NEOSTIGMINE METHYLSULFATE 10 MG/10ML IV SOLN
INTRAVENOUS | Status: AC
  Filled 2013-08-16: qty 1

## 2013-08-16 MED ORDER — OXYCODONE HCL 5 MG/5ML PO SOLN: 5.0000 mg | Freq: Once | ORAL | Status: DC | PRN

## 2013-08-16 MED ORDER — MIDAZOLAM HCL 2 MG/2ML IJ SOLN
INTRAMUSCULAR | Status: AC
  Filled 2013-08-16: qty 2

## 2013-08-16 MED ORDER — FENTANYL CITRATE 0.05 MG/ML IJ SOLN
INTRAMUSCULAR | Status: DC | PRN
  Administered 2013-08-16: 50 ug via INTRAVENOUS
  Administered 2013-08-16: 100 ug via INTRAVENOUS
  Administered 2013-08-16 (×3): 50 ug via INTRAVENOUS
  Administered 2013-08-16: 100 ug via INTRAVENOUS
  Administered 2013-08-16: 50 ug via INTRAVENOUS
  Administered 2013-08-16: 100 ug via INTRAVENOUS
  Administered 2013-08-16: 50 ug via INTRAVENOUS

## 2013-08-16 MED ORDER — ONDANSETRON HCL 4 MG/2ML IJ SOLN: 4.0000 mg | Freq: Four times a day (QID) | INTRAMUSCULAR | Status: DC | PRN

## 2013-08-16 MED ORDER — GLYCOPYRROLATE 0.2 MG/ML IJ SOLN
INTRAMUSCULAR | Status: AC
  Filled 2013-08-16: qty 2

## 2013-08-16 MED ORDER — DEXTROSE 5 % IV SOLN
1.0000 g | Freq: Four times a day (QID) | INTRAVENOUS | Status: AC
  Administered 2013-08-16: 1 g via INTRAVENOUS
  Filled 2013-08-16: qty 1

## 2013-08-16 MED ORDER — PROPOFOL 10 MG/ML IV BOLUS
INTRAVENOUS | Status: AC
  Filled 2013-08-16: qty 20

## 2013-08-16 MED ORDER — BUPIVACAINE-EPINEPHRINE (PF) 0.25% -1:200000 IJ SOLN
INTRAMUSCULAR | Status: DC | PRN
  Administered 2013-08-16: 2 mL

## 2013-08-16 MED ORDER — FENTANYL CITRATE 0.05 MG/ML IJ SOLN
INTRAMUSCULAR | Status: AC
  Filled 2013-08-16: qty 5

## 2013-08-16 MED ORDER — ARTIFICIAL TEARS OP OINT
TOPICAL_OINTMENT | OPHTHALMIC | Status: DC | PRN
  Administered 2013-08-16: 1 via OPHTHALMIC

## 2013-08-16 MED ORDER — ALVIMOPAN 12 MG PO CAPS
12.0000 mg | ORAL_CAPSULE | Freq: Two times a day (BID) | ORAL | Status: DC
  Administered 2013-08-17 – 2013-08-20 (×8): 12 mg via ORAL
  Filled 2013-08-16 (×11): qty 1

## 2013-08-16 MED ORDER — DEXTROSE 5 % IV SOLN
2.0000 g | INTRAVENOUS | Status: AC
  Administered 2013-08-16: 2 g via INTRAVENOUS
  Filled 2013-08-16 (×2): qty 2

## 2013-08-16 MED ORDER — SODIUM CHLORIDE 0.9 % IJ SOLN: 9.0000 mL | INTRAMUSCULAR | Status: DC | PRN

## 2013-08-16 MED ORDER — ONDANSETRON HCL 4 MG PO TABS: 4.0000 mg | ORAL_TABLET | Freq: Four times a day (QID) | ORAL | Status: DC | PRN

## 2013-08-16 MED ORDER — DEXAMETHASONE SODIUM PHOSPHATE 4 MG/ML IJ SOLN
INTRAMUSCULAR | Status: AC
  Filled 2013-08-16: qty 2

## 2013-08-16 MED ORDER — SODIUM CHLORIDE 0.9 % IR SOLN
Status: DC | PRN
  Administered 2013-08-16: 1000 mL

## 2013-08-16 MED ORDER — GLYCOPYRROLATE 0.2 MG/ML IJ SOLN
INTRAMUSCULAR | Status: DC | PRN
  Administered 2013-08-16: 0.4 mg via INTRAVENOUS

## 2013-08-16 MED ORDER — OXYCODONE HCL 5 MG PO TABS: 5.0000 mg | ORAL_TABLET | Freq: Once | ORAL | Status: DC | PRN

## 2013-08-16 MED ORDER — ROCURONIUM BROMIDE 50 MG/5ML IV SOLN
INTRAVENOUS | Status: AC
  Filled 2013-08-16: qty 1

## 2013-08-16 MED ORDER — HYDROMORPHONE 0.3 MG/ML IV SOLN
INTRAVENOUS | Status: DC
  Administered 2013-08-16: 2.8 mg via INTRAVENOUS
  Administered 2013-08-16: 2.1 mg via INTRAVENOUS
  Administered 2013-08-17: 18:00:00 via INTRAVENOUS
  Administered 2013-08-17: 1.91 mg via INTRAVENOUS
  Administered 2013-08-17: 0.9 mg via INTRAVENOUS
  Administered 2013-08-17: 02:00:00 via INTRAVENOUS
  Administered 2013-08-17: 2.1 mg via INTRAVENOUS
  Administered 2013-08-17: 2.87 mg via INTRAVENOUS
  Administered 2013-08-17: 0.6 mg via INTRAVENOUS
  Administered 2013-08-17: 1.2 mg via INTRAVENOUS
  Administered 2013-08-17: 1.8 mg via INTRAVENOUS
  Administered 2013-08-18: 15:00:00 via INTRAVENOUS
  Administered 2013-08-18: 1.8 mg via INTRAVENOUS
  Administered 2013-08-18: 3.3 mg via INTRAVENOUS
  Administered 2013-08-18: 2.7 mg via INTRAVENOUS
  Administered 2013-08-18: 2.55 mg via INTRAVENOUS
  Administered 2013-08-18: 3.6 mg via INTRAVENOUS
  Administered 2013-08-18: 2.7 mg via INTRAVENOUS
  Administered 2013-08-18 – 2013-08-19 (×2): via INTRAVENOUS
  Administered 2013-08-19: 2.1 mg via INTRAVENOUS
  Administered 2013-08-19: 1.8 mg via INTRAVENOUS
  Administered 2013-08-19: 2.1 mg via INTRAVENOUS
  Administered 2013-08-19: 2.99 mg via INTRAVENOUS
  Administered 2013-08-19: 3.6 mg via INTRAVENOUS
  Administered 2013-08-19: 11:00:00 via INTRAVENOUS
  Administered 2013-08-19: 4.92 mg via INTRAVENOUS
  Administered 2013-08-19: via INTRAVENOUS
  Administered 2013-08-19: 1.2 mg via INTRAVENOUS
  Administered 2013-08-20: 2.4 mg via INTRAVENOUS
  Administered 2013-08-20: 5.1 mg via INTRAVENOUS
  Filled 2013-08-16 (×8): qty 25

## 2013-08-16 MED ORDER — DIPHENHYDRAMINE HCL 12.5 MG/5ML PO ELIX: 12.5000 mg | ORAL_SOLUTION | Freq: Four times a day (QID) | ORAL | Status: DC | PRN

## 2013-08-16 MED ORDER — HYDROMORPHONE HCL PF 1 MG/ML IJ SOLN
0.2500 mg | INTRAMUSCULAR | Status: DC | PRN
  Administered 2013-08-16 (×4): 0.5 mg via INTRAVENOUS

## 2013-08-16 MED ORDER — NEOSTIGMINE METHYLSULFATE 10 MG/10ML IV SOLN
INTRAVENOUS | Status: DC | PRN
  Administered 2013-08-16: 3 mg via INTRAVENOUS

## 2013-08-16 MED ORDER — ALVIMOPAN 12 MG PO CAPS
12.0000 mg | ORAL_CAPSULE | ORAL | Status: AC
  Administered 2013-08-16: 12 mg via ORAL
  Filled 2013-08-16: qty 1

## 2013-08-16 MED ORDER — ONDANSETRON HCL 4 MG/2ML IJ SOLN
4.0000 mg | Freq: Four times a day (QID) | INTRAMUSCULAR | Status: DC | PRN
  Administered 2013-08-20: 4 mg via INTRAVENOUS
  Filled 2013-08-16: qty 2

## 2013-08-16 MED ORDER — PNEUMOCOCCAL VAC POLYVALENT 25 MCG/0.5ML IJ INJ
0.5000 mL | INJECTION | INTRAMUSCULAR | Status: AC
  Administered 2013-08-17: 0.5 mL via INTRAMUSCULAR
  Filled 2013-08-16: qty 0.5

## 2013-08-16 MED ORDER — LACTATED RINGERS IV SOLN
INTRAVENOUS | Status: DC | PRN
  Administered 2013-08-16 (×2): via INTRAVENOUS

## 2013-08-16 MED ORDER — ARTIFICIAL TEARS OP OINT
TOPICAL_OINTMENT | OPHTHALMIC | Status: AC
  Filled 2013-08-16: qty 3.5

## 2013-08-16 MED ORDER — HYDROMORPHONE HCL PF 1 MG/ML IJ SOLN
INTRAMUSCULAR | Status: AC
  Filled 2013-08-16: qty 2

## 2013-08-16 MED ORDER — DIPHENHYDRAMINE HCL 50 MG/ML IJ SOLN: 12.5000 mg | Freq: Four times a day (QID) | INTRAMUSCULAR | Status: DC | PRN

## 2013-08-16 MED ORDER — ONDANSETRON HCL 4 MG/2ML IJ SOLN
INTRAMUSCULAR | Status: DC | PRN
  Administered 2013-08-16: 4 mg via INTRAVENOUS

## 2013-08-16 MED ORDER — KETOROLAC TROMETHAMINE 15 MG/ML IJ SOLN
15.0000 mg | Freq: Four times a day (QID) | INTRAMUSCULAR | Status: AC | PRN
  Administered 2013-08-17 (×2): 15 mg via INTRAVENOUS
  Filled 2013-08-16 (×2): qty 1

## 2013-08-16 MED ORDER — DEXTROSE IN LACTATED RINGERS 5 % IV SOLN
INTRAVENOUS | Status: DC
  Administered 2013-08-16 – 2013-08-19 (×7): via INTRAVENOUS

## 2013-08-16 MED ORDER — LACTATED RINGERS IV SOLN
INTRAVENOUS | Status: DC
  Administered 2013-08-16: 50 mL/h via INTRAVENOUS

## 2013-08-16 MED ORDER — DEXAMETHASONE SODIUM PHOSPHATE 4 MG/ML IJ SOLN
INTRAMUSCULAR | Status: DC | PRN
  Administered 2013-08-16: 8 mg via INTRAVENOUS

## 2013-08-16 SURGICAL SUPPLY — 83 items
APPLIER CLIP 5 13 M/L LIGAMAX5 (MISCELLANEOUS)
APPLIER CLIP ROT 10 11.4 M/L (STAPLE)
APR CLP MED LRG 11.4X10 (STAPLE)
APR CLP MED LRG 5 ANG JAW (MISCELLANEOUS)
BLADE SURG ROTATE 9660 (MISCELLANEOUS) ×4 IMPLANT
CANISTER SUCTION 2500CC (MISCELLANEOUS) ×3 IMPLANT
CELLS DAT CNTRL 66122 CELL SVR (MISCELLANEOUS) IMPLANT
CHLORAPREP W/TINT 26ML (MISCELLANEOUS) ×3 IMPLANT
CLIP APPLIE 5 13 M/L LIGAMAX5 (MISCELLANEOUS) IMPLANT
CLIP APPLIE ROT 10 11.4 M/L (STAPLE) IMPLANT
COVER MAYO STAND STRL (DRAPES) ×3 IMPLANT
COVER SURGICAL LIGHT HANDLE (MISCELLANEOUS) ×6 IMPLANT
DRAPE PROXIMA HALF (DRAPES) IMPLANT
DRAPE UTILITY 15X26 W/TAPE STR (DRAPE) ×15 IMPLANT
DRAPE WARM FLUID 44X44 (DRAPE) ×3 IMPLANT
DRSG OPSITE POSTOP 4X10 (GAUZE/BANDAGES/DRESSINGS) ×2 IMPLANT
DRSG OPSITE POSTOP 4X8 (GAUZE/BANDAGES/DRESSINGS) IMPLANT
DRSG TEGADERM 2-3/8X2-3/4 SM (GAUZE/BANDAGES/DRESSINGS) ×2 IMPLANT
ELECT BLADE 6.5 EXT (BLADE) ×3 IMPLANT
ELECT CAUTERY BLADE 6.4 (BLADE) ×6 IMPLANT
ELECT REM PT RETURN 9FT ADLT (ELECTROSURGICAL) ×3
ELECTRODE REM PT RTRN 9FT ADLT (ELECTROSURGICAL) ×1 IMPLANT
GAUZE SPONGE 2X2 8PLY STRL LF (GAUZE/BANDAGES/DRESSINGS) ×1 IMPLANT
GEL ULTRASOUND 20GR AQUASONIC (MISCELLANEOUS) IMPLANT
GLOVE BIO SURGEON STRL SZ7 (GLOVE) ×4 IMPLANT
GLOVE BIO SURGEON STRL SZ8 (GLOVE) ×6 IMPLANT
GLOVE BIOGEL PI IND STRL 6.5 (GLOVE) IMPLANT
GLOVE BIOGEL PI IND STRL 7.0 (GLOVE) IMPLANT
GLOVE BIOGEL PI IND STRL 7.5 (GLOVE) IMPLANT
GLOVE BIOGEL PI IND STRL 8 (GLOVE) ×2 IMPLANT
GLOVE BIOGEL PI INDICATOR 6.5 (GLOVE) ×2
GLOVE BIOGEL PI INDICATOR 7.0 (GLOVE) ×14
GLOVE BIOGEL PI INDICATOR 7.5 (GLOVE) ×4
GLOVE BIOGEL PI INDICATOR 8 (GLOVE) ×4
GLOVE SURG SS PI 7.0 STRL IVOR (GLOVE) ×14 IMPLANT
GOWN STRL REUS W/ TWL LRG LVL3 (GOWN DISPOSABLE) ×6 IMPLANT
GOWN STRL REUS W/ TWL XL LVL3 (GOWN DISPOSABLE) ×2 IMPLANT
GOWN STRL REUS W/TWL LRG LVL3 (GOWN DISPOSABLE) ×24
GOWN STRL REUS W/TWL XL LVL3 (GOWN DISPOSABLE) ×6
KIT BASIN OR (CUSTOM PROCEDURE TRAY) ×3 IMPLANT
KIT ROOM TURNOVER OR (KITS) ×3 IMPLANT
LEGGING LITHOTOMY PAIR STRL (DRAPES) IMPLANT
LIGASURE IMPACT 36 18CM CVD LR (INSTRUMENTS) ×2 IMPLANT
NS IRRIG 1000ML POUR BTL (IV SOLUTION) ×10 IMPLANT
PAD ARMBOARD 7.5X6 YLW CONV (MISCELLANEOUS) ×6 IMPLANT
PENCIL BUTTON HOLSTER BLD 10FT (ELECTRODE) ×6 IMPLANT
RELOAD PROXIMATE 75MM BLUE (ENDOMECHANICALS) ×9 IMPLANT
RELOAD STAPLE 75 3.8 BLU REG (ENDOMECHANICALS) IMPLANT
RETRACTOR WND ALEXIS 18 MED (MISCELLANEOUS) IMPLANT
RTRCTR WOUND ALEXIS 18CM MED (MISCELLANEOUS)
SCALPEL HARMONIC ACE (MISCELLANEOUS) ×3 IMPLANT
SCISSORS LAP 5X35 DISP (ENDOMECHANICALS) ×3 IMPLANT
SET IRRIG TUBING LAPAROSCOPIC (IRRIGATION / IRRIGATOR) ×2 IMPLANT
SLEEVE ENDOPATH XCEL 5M (ENDOMECHANICALS) ×7 IMPLANT
SPECIMEN JAR LARGE (MISCELLANEOUS) ×3 IMPLANT
SPONGE GAUZE 2X2 STER 10/PKG (GAUZE/BANDAGES/DRESSINGS) ×2
SPONGE LAP 18X18 X RAY DECT (DISPOSABLE) ×8 IMPLANT
STAPLER PROXIMATE 75MM BLUE (STAPLE) ×2 IMPLANT
STAPLER VISISTAT 35W (STAPLE) ×3 IMPLANT
SUCTION POOLE TIP (SUCTIONS) ×3 IMPLANT
SURGILUBE 2OZ TUBE FLIPTOP (MISCELLANEOUS) IMPLANT
SUT PROLENE 2 0 CT2 30 (SUTURE) IMPLANT
SUT PROLENE 2 0 KS (SUTURE) IMPLANT
SUT VIC AB 2-0 SH 18 (SUTURE) ×3 IMPLANT
SUT VIC AB 3-0 SH 18 (SUTURE) ×3 IMPLANT
SUT VICRYL AB 2 0 TIES (SUTURE) ×3 IMPLANT
SUT VICRYL AB 3 0 TIES (SUTURE) ×3 IMPLANT
SYR BULB IRRIGATION 50ML (SYRINGE) ×3 IMPLANT
SYS LAPSCP GELPORT 120MM (MISCELLANEOUS)
SYSTEM LAPSCP GELPORT 120MM (MISCELLANEOUS) IMPLANT
TAPE CLOTH SOFT 2X10 (GAUZE/BANDAGES/DRESSINGS) ×2 IMPLANT
TOWEL OR 17X26 10 PK STRL BLUE (TOWEL DISPOSABLE) ×4 IMPLANT
TRAY FOLEY CATH 16FRSI W/METER (SET/KITS/TRAYS/PACK) ×3 IMPLANT
TRAY LAPAROSCOPIC (CUSTOM PROCEDURE TRAY) ×3 IMPLANT
TRAY PROCTOSCOPIC FIBER OPTIC (SET/KITS/TRAYS/PACK) IMPLANT
TROCAR XCEL 12X100 BLDLESS (ENDOMECHANICALS) IMPLANT
TROCAR XCEL BLUNT TIP 100MML (ENDOMECHANICALS) IMPLANT
TROCAR XCEL NON-BLD 11X100MML (ENDOMECHANICALS) IMPLANT
TROCAR XCEL NON-BLD 5MMX100MML (ENDOMECHANICALS) ×3 IMPLANT
TUBE CONNECTING 12'X1/4 (SUCTIONS) ×2
TUBE CONNECTING 12X1/4 (SUCTIONS) ×4 IMPLANT
TUBING FILTER THERMOFLATOR (ELECTROSURGICAL) ×3 IMPLANT
YANKAUER SUCT BULB TIP NO VENT (SUCTIONS) ×6 IMPLANT

## 2013-08-16 NOTE — Anesthesia Preprocedure Evaluation (Signed)
Anesthesia Evaluation  Patient identified by MRN, date of birth, ID band Patient awake    Reviewed: Allergy & Precautions, H&P , NPO status , Patient's Chart, lab work & pertinent test results  Airway Mallampati: II  Neck ROM: full    Dental   Pulmonary asthma , former smoker,          Cardiovascular negative cardio ROS      Neuro/Psych    GI/Hepatic GERD-  ,  Endo/Other  obese  Renal/GU      Musculoskeletal  (+) Arthritis -,   Abdominal   Peds  Hematology   Anesthesia Other Findings   Reproductive/Obstetrics                           Anesthesia Physical Anesthesia Plan  ASA: II  Anesthesia Plan: General   Post-op Pain Management:    Induction: Intravenous  Airway Management Planned: Oral ETT  Additional Equipment:   Intra-op Plan:   Post-operative Plan: Extubation in OR  Informed Consent: I have reviewed the patients History and Physical, chart, labs and discussed the procedure including the risks, benefits and alternatives for the proposed anesthesia with the patient or authorized representative who has indicated his/her understanding and acceptance.     Plan Discussed with: CRNA, Anesthesiologist and Surgeon  Anesthesia Plan Comments:         Anesthesia Quick Evaluation

## 2013-08-16 NOTE — Transfer of Care (Signed)
Immediate Anesthesia Transfer of Care Note  Patient: Zachary Kim  Procedure(s) Performed: Procedure(s): ATTEMPTED LAPAROSCOPIC PARTIAL COLECTOMY CONVERTED TO OPEN (N/A)  Patient Location: PACU  Anesthesia Type:General  Level of Consciousness: awake, alert , oriented and patient cooperative  Airway & Oxygen Therapy: Patient Spontanous Breathing and Patient connected to face mask oxygen  Post-op Assessment: Report given to PACU RN, Post -op Vital signs reviewed and stable and Patient moving all extremities X 4  Post vital signs: Reviewed and stable  Complications: No apparent anesthesia complications

## 2013-08-16 NOTE — Brief Op Note (Signed)
08/16/2013  12:37 PM  PATIENT:  Zachary Kim  63 y.o. male  PRE-OPERATIVE DIAGNOSIS:  cecal mass/polyp  POST-OPERATIVE DIAGNOSIS:  cecal mass/polyp  PROCEDURE:  Procedure(s): ATTEMPTED LAPAROSCOPIC PARTIAL COLECTOMY CONVERTED TO OPEN (N/A)  SURGEON:  Surgeon(s) and Role:    * Rei Medlen A. Lemarcus Baggerly, MD - Primary    * Rolm Bookbinder, MD - Assisting      ANESTHESIA:   general  EBL:  Total I/O In: 1500 [I.V.:1500] Out: 290 [Urine:90; Blood:200]  BLOOD ADMINISTERED:none  DRAINS: none   LOCAL MEDICATIONS USED:  BUPIVICAINE   SPECIMEN:  Source of Specimen:  right colon  DISPOSITION OF SPECIMEN:  PATHOLOGY  COUNTS:  YES  TOURNIQUET:  * No tourniquets in log *  DICTATION: .Other Dictation: Dictation Number [id number]  PLAN OF CARE: Admit to inpatient   PATIENT DISPOSITION:  PACU - hemodynamically stable.   Delay start of Pharmacological VTE agent (>24hrs) due to surgical blood loss or risk of bleeding: no

## 2013-08-16 NOTE — Anesthesia Postprocedure Evaluation (Signed)
  Anesthesia Post-op Note  Patient: Zachary Kim  Procedure(s) Performed: Procedure(s): ATTEMPTED LAPAROSCOPIC PARTIAL COLECTOMY CONVERTED TO OPEN (N/A)  Patient Location: PACU  Anesthesia Type:General  Level of Consciousness: awake and alert   Airway and Oxygen Therapy: Patient Spontanous Breathing  Post-op Pain: moderate  Post-op Assessment: Post-op Vital signs reviewed, Patient's Cardiovascular Status Stable and Respiratory Function Stable  Post-op Vital Signs: Reviewed  Filed Vitals:   08/16/13 1415  BP: 158/89  Pulse: 103  Temp: 36.8 C  Resp: 14    Complications: No apparent anesthesia complications

## 2013-08-16 NOTE — Interval H&P Note (Signed)
History and Physical Interval Note:  08/16/2013 9:40 AM  Zachary Kim  has presented today for surgery, with the diagnosis of cecal mass/polyp  The various methods of treatment have been discussed with the patient and family. After consideration of risks, benefits and other options for treatment, the patient has consented to  Procedure(s): LAPAROSCOPIC PARTIAL COLECTOMY (N/A) as a surgical intervention .  The patient's history has been reviewed, patient examined, no change in status, stable for surgery.  I have reviewed the patient's chart and labs.  Questions were answered to the patient's satisfaction.     Lenis Nettleton A.

## 2013-08-16 NOTE — H&P (View-Only) (Signed)
Patient ID: Zachary Kim, male   DOB: Mar 25, 1950, 63 y.o.   MRN: [id number]  No chief complaint on file.   HPI Zachary Kim is a 63 y.o. male.  Patient returns after colonoscopy. 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 No dysplasia. Another tubular villous adenoma identified and rectum and this was completely removed which showed tubulovillous adenoma without atypia. He continues to have low-grade chronic right lower quadrant pain. He has no energy. The pain comes and goes. No nausea or vomiting. His bowels are moving. HPI  Past Medical History  Diagnosis Date  . Hyperlipidemia   . Arthritis   . GERD (gastroesophageal reflux disease)   . HOH (hard of hearing)   . Gallstones     Past Surgical History  Procedure Laterality Date  . Small intestine surgery      intestine blockage  . Back surgery      lower slipped disc  . Cholecystectomy N/A 04/24/2013    Procedure: LAPAROSCOPIC CHOLECYSTECTOMY WITH INTRAOPERATIVE CHOLANGIOGRAM;  Surgeon: [name]. Zachary Vanderburg, MD;  Location: [place];  Service: General;  Laterality: N/A;  attempted  . Cholecystectomy  04/24/2013    Procedure: CHOLECYSTECTOMY;  Surgeon: [name]. Zachary Laws, MD;  Location: [place];  Service: General;;  . Lysis of adhesion N/A 04/24/2013    Procedure: LYSIS OF ADHESION;  Surgeon: [name]. Zachary Forquer, MD;  Location: [place] [facility];  Service: General;  Laterality: N/A;    Family History  Problem Relation Age of Onset  . Colon cancer Neg Hx   . Diabetes Mother   . Heart disease Father     had valve replacement    Social History History  Substance Use Topics  . Smoking status: Former Smoker -- 2.00 packs/day for 12 years    Types: Cigarettes    Quit date: 04/10/1983  . Smokeless tobacco: Current User    Types: Chew     Comment: chew tobacco, tobacco info given 06/29/13  . Alcohol Use: 1.8 oz/week    3 Cans of beer per week     Comment: daily    Allergies  Allergen Reactions  .  Other Other (See Comments)    Adhesive tape, blistered skin    Current Outpatient Prescriptions  Medication Sig Dispense Refill  . clarithromycin (BIAXIN) 500 MG tablet Take 500 mg by mouth 2 (two) times daily.       . Multiple Vitamins-Minerals (CENTRUM SILVER PO) Take 1 tablet by mouth daily.      . pantoprazole (PROTONIX) 20 MG tablet Take 2 tablets (40 mg total) by mouth daily. Take 1 tablet by mouth once daily  90 tablet  0  . predniSONE (DELTASONE) 10 MG tablet Take as directed, tapering down      . PROVENTIL HFA 108 (90 BASE) MCG/ACT inhaler Inhale 1-2 puffs into the lungs every 6 (six) hours as needed for wheezing or shortness of breath.        No current facility-administered medications for this visit.    Review of Systems Review of Systems  Respiratory: Negative.   Cardiovascular: Negative.   Gastrointestinal: Positive for abdominal pain and abdominal distention.    Blood pressure 146/78, pulse 72, resp. rate 16, height 6\' 2"  (1.88 m), weight 230 lb (104.327 kg).  Physical Exam Physical Exam  Constitutional: He appears well-developed and well-nourished.  HENT:  Head: Normocephalic.  Mouth/Throat: No oropharyngeal exudate.  Neck: Neck supple.  Cardiovascular: Normal rate.   Pulmonary/Chest: Effort normal and  breath sounds normal.  Abdominal: He exhibits distension. There is no tenderness.      Data Reviewed Colonoscopy shows large mass at ileocecal valve and cecum show new be tubular villous adenoma. Rectal polyp was completely removed showing tubulovillous adenoma. Neither had signs of dysplasia.  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  Laparoscopic partial colectomy discussed with the patient as well as non operative treatments. The risks of  operative management include bleeding,  Infection,  Leak of anastamosis,  Ostomy formation, open procedure,  Sepsis,  Abcess,  Hernia,  DVT,  Pulmonary complications,  Cardiovascular  complications,  Injury to ureter,  Bladder,kidney,and anesthesia risks,  And death. The patient understands.  Questions answered.   The success of the procedure is 50-100 % for treating the patients symptoms. They agree to proceed.       Zachary Kim Zachary Kim 07/20/2013, 12:42 PM

## 2013-08-17 LAB — BASIC METABOLIC PANEL
ANION GAP: 17 — AB (ref 5–15)
BUN: 9 mg/dL (ref 6–23)
CO2: 21 mEq/L (ref 19–32)
Calcium: 9 mg/dL (ref 8.4–10.5)
Chloride: 95 mEq/L — ABNORMAL LOW (ref 96–112)
Creatinine, Ser: 0.99 mg/dL (ref 0.50–1.35)
GFR calc non Af Amer: 86 mL/min — ABNORMAL LOW (ref >90)
Glucose, Bld: 177 mg/dL — ABNORMAL HIGH (ref 70–99)
Potassium: 4.5 mEq/L (ref 3.7–5.3)
Sodium: 133 mEq/L — ABNORMAL LOW (ref 137–147)

## 2013-08-17 LAB — CBC
HCT: 39.3 % (ref 39.0–52.0)
Hemoglobin: 13.8 g/dL (ref 13.0–17.0)
MCH: 33.9 pg (ref 26.0–34.0)
MCHC: 35.1 g/dL (ref 30.0–36.0)
MCV: 96.6 fL (ref 78.0–100.0)
Platelets: 185 10*3/uL (ref 150–400)
RBC: 4.07 MIL/uL — ABNORMAL LOW (ref 4.22–5.81)
RDW: 14.7 % (ref 11.5–15.5)
WBC: 15 10*3/uL — ABNORMAL HIGH (ref 4.0–10.5)

## 2013-08-17 NOTE — Progress Notes (Signed)
Patient ID: Zachary Kim, male   DOB: 05-Aug-1950, 63 y.o.   MRN: [id number]  [facility] - Progress Note  POD# 1  Subjective: Patient up in bed.  Mild pain.  NG in place.  Objective: Vital signs in last 24 hours: Temp:  [97.2 F (36.2 C)-98.3 F (36.8 C)] 97.5 F (36.4 C) (07/03 0952) Pulse Rate:  [72-112] 72 (07/03 0952) Resp:  [9-18] 14 (07/03 0952) BP: (114-178)/(62-94) 116/64 mmHg (07/03 0952) SpO2:  [91 %-100 %] 96 % (07/03 0952) Weight:  [233 lb 6.4 oz (105.87 kg)] 233 lb 6.4 oz (105.87 kg) (07/02 1415) Last BM Date: 08/14/13  Intake/Output from previous day: 07/02 0701 - 07/03 0700 In: 2270.8 [I.V.:2270.8] Out: 1590 [Urine:1090; Emesis/NG output:300; Blood:200]  Exam: HEENT - clear, not icteric Neck - soft Chest - clear bilaterally Cor - RRR, no murmur Abd - soft, mild distension; wounds clear and intact; no BS present Ext - no significant edema Neuro - grossly intact, no focal deficits  Lab Results:   Recent Labs  08/16/13 1600 08/17/13 0435  WBC 18.0* 15.0*  HGB 15.6 13.8  HCT 44.2 39.3  PLT 180 185     Recent Labs  08/16/13 1600 08/17/13 0435  NA  --  133*  K  --  4.5  CL  --  95*  CO2  --  21  GLUCOSE  --  177*  BUN  --  9  CREATININE 0.91 0.99  CALCIUM  --  9.0    Studies/Results: No results found.  Assessment / Plan: 1.  Status post right colectomy  NPO, NG, IVF  OOB to chair  PCA for pain control  Entereg po  Earnstine Regal, MD, [facility] Office: [phone]  08/17/2013

## 2013-08-17 NOTE — Op Note (Signed)
NAMEMAMIE, DIIORIO NO.:  [id number]  MEDICAL RECORD NO.:  [id number]  LOCATION:  6N02C                        FACILITY:  [facility]  PHYSICIAN:  Marcello Moores A. Athen Riel, M.D.DATE OF BIRTH:  1950-08-21  DATE OF PROCEDURE:  08/16/2013 DATE OF DISCHARGE:                              OPERATIVE REPORT   PREOPERATIVE DIAGNOSIS:  Large cecal mass.  POSTOPERATIVE DIAGNOSIS:  Large cecal mass.  PROCEDURE:  Attempted laparoscopic converted to open partial colectomy.  SURGEON:  Marcello Moores A. Jakub Debold, M.D.  ASSISTANT:  Mila Homer. Donne Hazel, MD.  ANESTHESIA:  General endotracheal anesthesia with 0.25% Sensorcaine local.  EBL:  500 mL.  SPECIMENS:  Terminal ileum and ascending colon to Pathology.  DRAINS:  None.  IV FLUIDS:  1500 mL of crystalloid.  INDICATIONS FOR PROCEDURE:  The patient is a 63 year old male who is found to have a large cecal mass on colonoscopy.  He underwent an open cholecystectomy in February of this year and had postoperative issues with ileus.  CT scan showed an appendiceal mass.  Once his ileus resolved, we re-imaged him and the appendiceal dilation of mass persisted.  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  I recommended resection, but he had previous multiple other operations.  I discussed the need for open conversion, but an attempt at laparoscopic partial colectomy could be done.  We discussed the risks of procedure to include but not exclusive of bleeding, infection, anastomotic breakdown, bowel obstruction requiring more surgery, hernia, organ injury, injury to the kidneys, injury to the bladder and ureter, death, DVT, cardiovascular complications, pulmonary complications, among others.  He understood the above and wished to proceed.  Alternatives were discussed as  well.  DESCRIPTION OF PROCEDURE:  The patient was met in the holding area and questions were answered.  He was taken back to the operating room and placed supine on the OR table.  After induction of general endotracheal anesthesia, a Foley catheter was placed under sterile conditions.  The abdomen was then prepped and draped in the sterile fashion.  He received 2 g of cefoxitin.  Time-out was done.  A 1 cm left lower quadrant incision was made and with the use of a 5 mm Optiview port, we advanced the port to the abdominal wall with the scope under direct vision.  No evidence of injury to viscera as we entered.  We were then able to establish a pneumoperitoneum to 15 mmHg of CO2.  Upon laparoscopy, there were numerous adhesions of the transverse colon to the anterior abdominal wall.  There was mild dilation of the small bowel.  I placed two other 5 mm ports, one in the left upper quadrant and the second in the lower midline.  I tried to mobilize the cecum and small bowel off the right lateral attachments, but this was densely adherent and adhesions were too dense to do laparoscopic.  I felt at this point it was wise to convert to an open procedure which is what we did.  All the ports were removed.  CO2 was allowed  to escape.  Midline incision was used and dissection was carried down to the fascia.  This was opened in the midline.  The abdominal cavity was entered.  He had dense right upper quadrant adhesions from his previous open cholecystectomy 3 months ago.  I was able to identify the cecum.  The appendix was inflamed appearing and dilated but without perforation.  I mobilized the terminal ileum and cecum from its retroperitoneal attachments using cautery with care taken not to injure the right ureter or the iliac vessels.  We then began to mobilize the right colon off the gutter using cautery until we got to the hepatic flexure.  This was densely adherent to the undersurface of the  liver and gallbladder fossa. We took some time to carefully mobilize this with the cautery without injuring the liver, colon, or duodenum.  Once the hepatic flexure was fully mobilized, we were able to pull this up into the wound to visualize this better.  The mass was palpated in the cecum.  I then used a GIA-75 stapling device to divide the terminal ileum approximately 15 cm proximal to the ileocecal valve.  A second load was used to divide the colon in between the right colonic and middle colonic vessels. Mesentery was taken down with the ligature without difficulty and specimen was removed.  We then created a side-to-side functional end-to- end anastomosis using a GIA-75 stapling device and a TA-60 to close the common enterotomy defect.  Of note, there was no bleeding from the staple lines upon inspection.  We closed the common mesenteric defect with 2-0 Vicryl.  The anastomosis laid without kinking, and an additional 3-0 Vicryl stitch was placed in the crotch of the anastomosis.  It was widely patent without any signs of leakage or tension.  The small bowel was run from the anastomosis proximally, and there was no evidence of any kinking or twisting of the bowel.  Of note, there were some left upper quadrant small bowel adhesions but he was not obstructed from these, therefore it was not taken down, but we could trace the bowel all the way up to the ligament of Treitz.  The remainder of the transverse colon and descending colon as well as sigmoid colon were all grossly normal without mass or lesion.  Irrigation was then used and suctioned out.  We then changed gloves and gowns and changed all instruments and redraped as per the colon protocol.  We then closed the fascia with #1 double stranded PDS in a running fashion.  The skin was then closed with staples after irrigation of the subcutaneous tissues.  Port sites were closed with staples as well.  All final counts of sponge, needle,  and instruments found to be correct at this portion of the case.  The patient was awoken, extubated, and taken to recovery in satisfactory condition.     Skyler Dusing A. Kevis Qu, M.D.     TAC/MEDQ  D:  08/16/2013  T:  08/16/2013  Job:  [id number]

## 2013-08-18 MED ORDER — PHENOL 1.4 % MT LIQD
1.0000 | OROMUCOSAL | Status: DC | PRN
  Administered 2013-08-18: 1 via OROMUCOSAL
  Filled 2013-08-18: qty 177

## 2013-08-18 MED ORDER — SODIUM CHLORIDE 0.9 % IV BOLUS (SEPSIS)
500.0000 mL | Freq: Once | INTRAVENOUS | Status: AC
  Administered 2013-08-18: 500 mL via INTRAVENOUS

## 2013-08-18 NOTE — Progress Notes (Signed)
Patient ID: Zachary Kim, male   DOB: 1950/05/23, 63 y.o.   MRN: [id number]  General Surgery - [facility] - Progress Note  POD# 2  Subjective: Patient unable to void last PM - Foley replaced with 450 cc out.  Objective: Vital signs in last 24 hours: Temp:  [97.5 F (36.4 C)-99.2 F (37.3 C)] 97.9 F (36.6 C) (07/04 0642) Pulse Rate:  [70-80] 80 (07/04 0642) Resp:  [10-20] 14 (07/04 0800) BP: (115-142)/(62-77) 118/62 mmHg (07/04 0642) SpO2:  [93 %-100 %] 94 % (07/04 0800) Last BM Date: 08/14/13  Intake/Output from previous day: 07/03 0701 - 07/04 0700 In: 4264.1 [I.V.:4224.1; NG/GT:40] Out: 750 [Urine:700; Emesis/NG output:50]  Exam: HEENT - clear, not icteric Neck - soft Chest - clear bilaterally Cor - RRR, no murmur Abd - protuberant, BS present; wound with small serosanguinous, dressing intact Ext - no significant edema Neuro - grossly intact, no focal deficits  Lab Results:   Recent Labs  08/16/13 1600 08/17/13 0435  WBC 18.0* 15.0*  HGB 15.6 13.8  HCT 44.2 39.3  PLT 180 185     Recent Labs  08/16/13 1600 08/17/13 0435  NA  --  133*  K  --  4.5  CL  --  95*  CO2  --  21  GLUCOSE  --  177*  BUN  --  9  CREATININE 0.91 0.99  CALCIUM  --  9.0    Studies/Results: No results found.  Assessment / Plan: 1.  Status post right colectomy  Discontinue NG tube this AM  Sips clear liquids, no trays  Encouraged ambulation  Leave foley in place 48 hours for retention  Earnstine Regal, MD, [facility] Office: [phone]  08/18/2013

## 2013-08-18 NOTE — Progress Notes (Signed)
Pt has done well without NG out today, no c/o nausea or vomiting.  Taking very limited sips of clear liquids.

## 2013-08-19 MED ORDER — KCL IN DEXTROSE-NACL 30-5-0.45 MEQ/L-%-% IV SOLN
INTRAVENOUS | Status: DC
  Administered 2013-08-19 – 2013-08-20 (×3): via INTRAVENOUS
  Filled 2013-08-19 (×6): qty 1000

## 2013-08-19 NOTE — Progress Notes (Signed)
Patient ID: Zachary Kim, male   DOB: 04-11-1950, 63 y.o.   MRN: [id number]  General Surgery - [facility] - Progress Note  POD# 3  Subjective: Patient up in chair.  Tolerating sips - wants more liquids.  No nausea or emesis.  Foley in.  Objective: Vital signs in last 24 hours: Temp:  [97.5 F (36.4 C)-99.2 F (37.3 C)] 97.5 F (36.4 C) (07/05 0525) Pulse Rate:  [66-75] 68 (07/05 0525) Resp:  [11-18] 16 (07/05 0800) BP: (112-147)/(61-67) 147/65 mmHg (07/05 0525) SpO2:  [95 %-100 %] 100 % (07/05 0800) Last BM Date: 08/14/13  Intake/Output from previous day: 07/04 0701 - 07/05 0700 In: 3519 [P.O.:200; I.V.:3319] Out: 2780 [Urine:2780]  Exam: HEENT - clear, not icteric Neck - soft Chest - clear bilaterally Cor - RRR, no murmur Abd - protuberant, mild distension; BS present; wound clear and dry and intact Ext - no significant edema Neuro - grossly intact, no focal deficits  Lab Results:   Recent Labs  08/16/13 1600 08/17/13 0435  WBC 18.0* 15.0*  HGB 15.6 13.8  HCT 44.2 39.3  PLT 180 185     Recent Labs  08/16/13 1600 08/17/13 0435  NA  --  133*  K  --  4.5  CL  --  95*  CO2  --  21  GLUCOSE  --  177*  BUN  --  9  CREATININE 0.91 0.99  CALCIUM  --  9.0    Studies/Results: No results found.  Assessment / Plan: 1. Status post right colectomy   Advance to clear liquid diet   Encouraged ambulation   Leave foley in place 48 hours for retention  Earnstine Regal, MD, [facility] Office: [phone]  08/19/2013

## 2013-08-20 ENCOUNTER — Encounter (HOSPITAL_COMMUNITY): Payer: Self-pay | Admitting: Surgery

## 2013-08-20 ENCOUNTER — Other Ambulatory Visit (INDEPENDENT_AMBULATORY_CARE_PROVIDER_SITE_OTHER): Payer: Self-pay | Admitting: Surgery

## 2013-08-20 DIAGNOSIS — K219 Gastro-esophageal reflux disease without esophagitis: Secondary | ICD-10-CM

## 2013-08-20 LAB — CBC
HCT: 35 % — ABNORMAL LOW (ref 39.0–52.0)
Hemoglobin: 11.9 g/dL — ABNORMAL LOW (ref 13.0–17.0)
MCH: 33.6 pg (ref 26.0–34.0)
MCHC: 34 g/dL (ref 30.0–36.0)
MCV: 98.9 fL (ref 78.0–100.0)
PLATELETS: 191 10*3/uL (ref 150–400)
RBC: 3.54 MIL/uL — AB (ref 4.22–5.81)
RDW: 14.1 % (ref 11.5–15.5)
WBC: 9.4 10*3/uL (ref 4.0–10.5)

## 2013-08-20 LAB — BASIC METABOLIC PANEL
ANION GAP: 12 (ref 5–15)
BUN: 4 mg/dL — ABNORMAL LOW (ref 6–23)
CALCIUM: 9.1 mg/dL (ref 8.4–10.5)
CO2: 26 meq/L (ref 19–32)
Chloride: 97 mEq/L (ref 96–112)
Creatinine, Ser: 0.8 mg/dL (ref 0.50–1.35)
GFR calc non Af Amer: >90 mL/min (ref >90)
Glucose, Bld: 124 mg/dL — ABNORMAL HIGH (ref 70–99)
Potassium: 4.1 mEq/L (ref 3.7–5.3)
SODIUM: 135 meq/L — AB (ref 137–147)

## 2013-08-20 MED ORDER — PANTOPRAZOLE SODIUM 40 MG PO TBEC: 40.0000 mg | DELAYED_RELEASE_TABLET | Freq: Every day | ORAL | Status: DC

## 2013-08-20 MED ORDER — PANTOPRAZOLE SODIUM 40 MG PO TBEC
40.0000 mg | DELAYED_RELEASE_TABLET | Freq: Every day | ORAL | Status: DC
Start: 2013-08-20 — End: 2013-08-22
  Administered 2013-08-20 – 2013-08-21 (×2): 40 mg via ORAL
  Filled 2013-08-20 (×2): qty 1

## 2013-08-20 MED ORDER — HYDROMORPHONE HCL PF 1 MG/ML IJ SOLN: 1.0000 mg | INTRAMUSCULAR | Status: DC | PRN

## 2013-08-20 MED ORDER — OXYCODONE-ACETAMINOPHEN 5-325 MG PO TABS
1.0000 | ORAL_TABLET | ORAL | Status: DC | PRN
  Administered 2013-08-20 – 2013-08-21 (×2): 2 via ORAL
  Administered 2013-08-21: 1 via ORAL
  Administered 2013-08-22 (×2): 2 via ORAL
  Filled 2013-08-20: qty 2
  Filled 2013-08-20: qty 1
  Filled 2013-08-20 (×3): qty 2

## 2013-08-20 MED ORDER — FUROSEMIDE 20 MG PO TABS
20.0000 mg | ORAL_TABLET | Freq: Once | ORAL | Status: AC
  Administered 2013-08-20: 20 mg via ORAL
  Filled 2013-08-20: qty 1

## 2013-08-20 MED ORDER — TAMSULOSIN HCL 0.4 MG PO CAPS
0.4000 mg | ORAL_CAPSULE | Freq: Every day | ORAL | Status: DC
  Administered 2013-08-20 – 2013-08-21 (×2): 0.4 mg via ORAL
  Filled 2013-08-20 (×3): qty 1

## 2013-08-20 NOTE — Progress Notes (Signed)
4 Days Post-Op  Subjective: Tolerating clears but no flatus.  Could not void over the weekend.   Objective: Vital signs in last 24 hours: Temp:  [97.6 F (36.4 C)-98.2 F (36.8 C)] 97.8 F (36.6 C) (07/06 0542) Pulse Rate:  [60-83] 65 (07/06 0542) Resp:  [11-16] 12 (07/06 0822) BP: (134-147)/(61-76) 144/71 mmHg (07/06 0542) SpO2:  [96 %-100 %] 98 % (07/06 0822) Last BM Date: 08/14/13  Intake/Output from previous day: 07/05 0701 - 07/06 0700 In: 3236.3 [P.O.:1680; I.V.:1556.3] Out: 5050 [Urine:5050] Intake/Output this shift:    Incision/Wound:dressing in place.  C/D/I  BS present  Lab Results:   Recent Labs  08/20/13 0615  WBC 9.4  HGB 11.9*  HCT 35.0*  PLT 191   BMET  Recent Labs  08/20/13 0615  NA 135*  K 4.1  CL 97  CO2 26  GLUCOSE 124*  BUN 4*  CREATININE 0.80  CALCIUM 9.1   PT/INR No results found for this basename: LABPROT, INR,  in the last 72 hours ABG No results found for this basename: PHART, PCO2, PO2, HCO3,  in the last 72 hours  Studies/Results: No results found.  Anti-infectives: Anti-infectives   Start     Dose/Rate Route Frequency Ordered Stop   08/16/13 1800  cefOXitin (MEFOXIN) 1 g in dextrose 5 % 50 mL IVPB     1 g 100 mL/hr over 30 Minutes Intravenous 4 times per day 08/16/13 1445 08/16/13 1735   08/16/13 0900  cefoTEtan (CEFOTAN) 2 g in dextrose 5 % 50 mL IVPB     2 g 100 mL/hr over 30 Minutes Intravenous On call to O.R. 08/16/13 0820 08/16/13 1018      Assessment/Plan: s/p Procedure(s): ATTEMPTED LAPAROSCOPIC PARTIAL COLECTOMY CONVERTED TO OPEN (N/A) d/c foley Advance diet DC PCA   LOS: 4 days    Zachary Kim A. 08/20/2013

## 2013-08-21 NOTE — Progress Notes (Signed)
5 Days Post-Op  Subjective: Pt with small BM.Voiding.   Objective: Vital signs in last 24 hours: Temp:  [97.6 F (36.4 C)-98.5 F (36.9 C)] 98.5 F (36.9 C) (07/07 0526) Pulse Rate:  [60-66] 63 (07/07 0526) Resp:  [12-20] 20 (07/07 0526) BP: (114-153)/(62-71) 114/62 mmHg (07/07 0526) SpO2:  [98 %-100 %] 98 % (07/07 0526) Last BM Date: 08/20/13  Intake/Output from previous day: 07/06 0701 - 07/07 0700 In: 1213 [P.O.:600; I.V.:613] Out: 5950 [Urine:5950] Intake/Output this shift:    Incision/Wound:dressing in place.  Wound C/D/I.  BS present.   Lab Results:   Recent Labs  08/20/13 0615  WBC 9.4  HGB 11.9*  HCT 35.0*  PLT 191   BMET  Recent Labs  08/20/13 0615  NA 135*  K 4.1  CL 97  CO2 26  GLUCOSE 124*  BUN 4*  CREATININE 0.80  CALCIUM 9.1   PT/INR No results found for this basename: LABPROT, INR,  in the last 72 hours ABG No results found for this basename: PHART, PCO2, PO2, HCO3,  in the last 72 hours  Studies/Results: No results found.  Anti-infectives: Anti-infectives   Start     Dose/Rate Route Frequency Ordered Stop   08/16/13 1800  cefOXitin (MEFOXIN) 1 g in dextrose 5 % 50 mL IVPB     1 g 100 mL/hr over 30 Minutes Intravenous 4 times per day 08/16/13 1445 08/16/13 1735   08/16/13 0900  cefoTEtan (CEFOTAN) 2 g in dextrose 5 % 50 mL IVPB     2 g 100 mL/hr over 30 Minutes Intravenous On call to O.R. 08/16/13 0820 08/16/13 1018      Assessment/Plan: s/p Procedure(s): ATTEMPTED LAPAROSCOPIC PARTIAL COLECTOMY CONVERTED TO OPEN (N/A) Adv diet SL IV Path benign shower  LOS: 5 days    Cassidey Barrales A. 08/21/2013

## 2013-08-22 ENCOUNTER — Telehealth (INDEPENDENT_AMBULATORY_CARE_PROVIDER_SITE_OTHER): Payer: Self-pay

## 2013-08-22 MED ORDER — OXYCODONE-ACETAMINOPHEN 5-325 MG PO TABS: 1.0000 | ORAL_TABLET | ORAL | Status: DC | PRN

## 2013-08-22 MED ORDER — POLYETHYLENE GLYCOL 3350 17 GM/SCOOP PO POWD: 17.0000 g | Freq: Once | ORAL | Status: DC

## 2013-08-22 NOTE — Progress Notes (Signed)
6 Days Post-Op  Subjective: Doing well.  Bowels moving voiding  eating  Objective: Vital signs in last 24 hours: Temp:  [98.3 F (36.8 C)-98.4 F (36.9 C)] 98.4 F (36.9 C) (07/08 0539) Pulse Rate:  [65-79] 74 (07/08 0539) Resp:  [18] 18 (07/08 0539) BP: (125-145)/(71-80) 130/80 mmHg (07/08 0539) SpO2:  [97 %-100 %] 98 % (07/08 0539) Last BM Date: 08/21/13  Intake/Output from previous day: 07/07 0701 - 07/08 0700 In: 1188.5 [P.O.:956; I.V.:232.5] Out: 1775 [Urine:1775] Intake/Output this shift:    Incision/Wound:C/D/I  Soft protuberant  Lab Results:   Recent Labs  08/20/13 0615  WBC 9.4  HGB 11.9*  HCT 35.0*  PLT 191   BMET  Recent Labs  08/20/13 0615  NA 135*  K 4.1  CL 97  CO2 26  GLUCOSE 124*  BUN 4*  CREATININE 0.80  CALCIUM 9.1   PT/INR No results found for this basename: LABPROT, INR,  in the last 72 hours ABG No results found for this basename: PHART, PCO2, PO2, HCO3,  in the last 72 hours  Studies/Results: No results found.  Anti-infectives: Anti-infectives   Start     Dose/Rate Route Frequency Ordered Stop   08/16/13 1800  cefOXitin (MEFOXIN) 1 g in dextrose 5 % 50 mL IVPB     1 g 100 mL/hr over 30 Minutes Intravenous 4 times per day 08/16/13 1445 08/16/13 1735   08/16/13 0900  cefoTEtan (CEFOTAN) 2 g in dextrose 5 % 50 mL IVPB     2 g 100 mL/hr over 30 Minutes Intravenous On call to O.R. 08/16/13 0820 08/16/13 1018      Assessment/Plan: s/p Procedure(s): ATTEMPTED LAPAROSCOPIC PARTIAL COLECTOMY CONVERTED TO OPEN (N/A) Discharge  LOS: 6 days    Ugochukwu Chichester A. 08/22/2013

## 2013-08-22 NOTE — Discharge Summary (Signed)
Physician Discharge Summary  Patient ID: Zachary Kim MRN: [id number] DOB/AGE: 09-26-1950 63 y.o.  Admit date: 08/16/2013 Discharge date: 08/22/2013  Admission Diagnoses: colon mass  Discharge Diagnoses: same Active Problems:   Colonic mass   Discharged Condition: good  Hospital Course: Pt did well after  Right hemicolectomy.  Bowel function returned day 4 and voiding improved after some mild urinary retention. Soft diet tolerated and wounds clean.  Ambulating without difficulty. Discharged home.   Consults: None  Significant Diagnostic Studies: adenoma without atypia on final path  Treatments: surgery: right hemicolectomy  Discharge Exam: Blood pressure 130/80, pulse 74, temperature 98.4 F (36.9 C), temperature source Oral, resp. rate 18, height 6\' 2"  (1.88 m), weight 233 lb 6.4 oz (105.87 kg), SpO2 98.00%. Resp: clear to auscultation bilaterally Cardio: regular rate and rhythm, S1, S2 normal, no murmur, click, rub or gallop Incision/Wound:C/D/I BS present  Disposition: 01-Home or Self Care  Discharge Instructions   Diet - low sodium heart healthy    Complete by:  As directed      Increase activity slowly    Complete by:  As directed             Medication List         ascorbic acid 500 MG tablet  Commonly known as:  VITAMIN C  Take 500 mg by mouth daily.     CENTRUM SILVER PO  Take 1 tablet by mouth daily.     cholecalciferol 1000 UNITS tablet  Commonly known as:  VITAMIN D  Take 1,000 Units by mouth daily.     ibuprofen 200 MG tablet  Commonly known as:  ADVIL,MOTRIN  Take 200 mg by mouth daily as needed for mild pain.     oxyCODONE-acetaminophen 5-325 MG per tablet  Commonly known as:  PERCOCET/ROXICET  Take 1-2 tablets by mouth every 4 (four) hours as needed for severe pain.     pantoprazole 40 MG tablet  Commonly known as:  PROTONIX  Take 1 tablet (40 mg total) by mouth daily.     polyethylene glycol powder powder  Commonly known as:   GLYCOLAX  Take 17 g by mouth once.     PROVENTIL HFA 108 (90 BASE) MCG/ACT inhaler  Generic drug:  albuterol  Inhale 1 puff into the lungs every 6 (six) hours as needed for wheezing or shortness of breath.         Signed: Ridhaan Dreibelbis A. 08/22/2013, 7:23 AM

## 2013-08-22 NOTE — Discharge Instructions (Signed)
[facility]      [facility] [phone]  OPEN ABDOMINAL SURGERY: POST OP INSTRUCTIONS  Always review your discharge instruction sheet given to you by the facility where your surgery was performed.  IF YOU HAVE DISABILITY OR FAMILY LEAVE FORMS, YOU MUST BRING THEM TO THE OFFICE FOR PROCESSING.  PLEASE DO NOT GIVE THEM TO YOUR DOCTOR.  1. A prescription for pain medication may be given to you upon discharge.  Take your pain medication as prescribed, if needed.  If narcotic pain medicine is not needed, then you may take acetaminophen (Tylenol) or ibuprofen (Advil) as needed. 2. Take your usually prescribed medications unless otherwise directed. 3. If you need a refill on your pain medication, please contact your pharmacy. They will contact our office to request authorization.  Prescriptions will not be filled after 5pm or on week-ends. 4. You should follow a light diet the first few days after arrival home, such as soup and crackers, pudding, etc.unless your doctor has advised otherwise. A high-fiber, low fat diet can be resumed as tolerated.   Be sure to include lots of fluids daily. Most patients will experience some swelling and bruising on the chest and neck area.  Ice packs will help.  Swelling and bruising can take several days to resolve 5. Most patients will experience some swelling and bruising in the area of the incision. Ice pack will help. Swelling and bruising can take several days to resolve..  6. It is common to experience some constipation if taking pain medication after surgery.  Increasing fluid intake and taking a stool softener will usually help or prevent this problem from occurring.  A mild laxative (Milk of Magnesia or Miralax) should be taken according to package directions if there are no bowel movements after 48 hours. 7.  You may have steri-strips (small skin tapes) in place directly over the incision.  These strips should be left on the skin for 7-10 days.  If your  surgeon used skin glue on the incision, you may shower in 24 hours.  The glue will flake off over the next 2-3 weeks.  Any sutures or staples will be removed at the office during your follow-up visit. You may find that a light gauze bandage over your incision may keep your staples from being rubbed or pulled. You may shower and replace the bandage daily. 8. ACTIVITIES:  You may resume regular (light) daily activities beginning the next day--such as daily self-care, walking, climbing stairs--gradually increasing activities as tolerated.  You may have sexual intercourse when it is comfortable.  Refrain from any heavy lifting or straining until approved by your doctor. a. You may drive when you no longer are taking prescription pain medication, you can comfortably wear a seatbelt, and you can safely maneuver your car and apply brakes b. Return to Work: ___________________________________ 9. You should see your doctor in the office for a follow-up appointment approximately two weeks after your surgery.  Make sure that you call for this appointment within a day or two after you arrive home to insure a convenient appointment time. OTHER INSTRUCTIONS:  _____________________________________________________________ _____________________________________________________________  WHEN TO CALL YOUR DOCTOR: 1. Fever over 101.0 2. Inability to urinate 3. Nausea and/or vomiting 4. Extreme swelling or bruising 5. Continued bleeding from incision. 6. Increased pain, redness, or drainage from the incision. 7. Difficulty swallowing or breathing 8. Muscle cramping or spasms. 9. Numbness or tingling in hands or feet or around lips.  The clinic staff is available to   answer your questions during regular business hours.  Please don't hesitate to call and ask to speak to one of the nurses if you have concerns.  For further questions, please visit [email or web address]   

## 2013-08-22 NOTE — Telephone Encounter (Signed)
Message copied by Carlene Coria on Wed Aug 22, 2013  4:14 PM ------      Message from: Erroll Luna A      Created: Wed Aug 22, 2013  7:22 AM       Needs to return Monday for staples  Please call.  ------

## 2013-08-22 NOTE — Progress Notes (Signed)
Discharge instructions gone over with patient. Home medications gone over. Prescriptions given. Follow up appointment to be made. Diet, activity, bowel regimen, and incisional care gone over. Signs and symptoms of infections gone over and what to do. My chart discussed. Patient verbalized understanding of instructions.

## 2013-08-22 NOTE — Telephone Encounter (Signed)
Called pt wife with appt.

## 2013-08-23 ENCOUNTER — Observation Stay (HOSPITAL_COMMUNITY): Payer: BC Managed Care – PPO

## 2013-08-23 ENCOUNTER — Emergency Department (HOSPITAL_COMMUNITY): Payer: BC Managed Care – PPO

## 2013-08-23 ENCOUNTER — Telehealth (INDEPENDENT_AMBULATORY_CARE_PROVIDER_SITE_OTHER): Payer: Self-pay | Admitting: Surgery

## 2013-08-23 ENCOUNTER — Inpatient Hospital Stay (HOSPITAL_COMMUNITY)
Admission: EM | Admit: 2013-08-23 | Discharge: 2013-09-03 | DRG: 393 | Disposition: A | Discharge status: Home / Self Care | Payer: BC Managed Care – PPO | Attending: Surgery | Admitting: Surgery

## 2013-08-23 ENCOUNTER — Encounter (HOSPITAL_COMMUNITY): Payer: Self-pay | Admitting: Emergency Medicine

## 2013-08-23 DIAGNOSIS — E871 Hypo-osmolality and hyponatremia: Secondary | ICD-10-CM

## 2013-08-23 DIAGNOSIS — K6389 Other specified diseases of intestine: Secondary | ICD-10-CM

## 2013-08-23 DIAGNOSIS — B309 Viral conjunctivitis, unspecified: Secondary | ICD-10-CM

## 2013-08-23 DIAGNOSIS — R509 Fever, unspecified: Secondary | ICD-10-CM | POA: Diagnosis not present

## 2013-08-23 DIAGNOSIS — D649 Anemia, unspecified: Secondary | ICD-10-CM

## 2013-08-23 DIAGNOSIS — X58XXXA Exposure to other specified factors, initial encounter: Secondary | ICD-10-CM | POA: Diagnosis present

## 2013-08-23 DIAGNOSIS — K9189 Other postprocedural complications and disorders of digestive system: Secondary | ICD-10-CM

## 2013-08-23 DIAGNOSIS — Z9889 Other specified postprocedural states: Secondary | ICD-10-CM

## 2013-08-23 DIAGNOSIS — K929 Disease of digestive system, unspecified: Principal | ICD-10-CM | POA: Diagnosis present

## 2013-08-23 DIAGNOSIS — K560 Paralytic ileus: Secondary | ICD-10-CM | POA: Diagnosis present

## 2013-08-23 DIAGNOSIS — Z87891 Personal history of nicotine dependence: Secondary | ICD-10-CM

## 2013-08-23 DIAGNOSIS — K219 Gastro-esophageal reflux disease without esophagitis: Secondary | ICD-10-CM | POA: Diagnosis present

## 2013-08-23 DIAGNOSIS — E876 Hypokalemia: Secondary | ICD-10-CM | POA: Diagnosis present

## 2013-08-23 DIAGNOSIS — H9190 Unspecified hearing loss, unspecified ear: Secondary | ICD-10-CM | POA: Diagnosis present

## 2013-08-23 DIAGNOSIS — J189 Pneumonia, unspecified organism: Secondary | ICD-10-CM

## 2013-08-23 DIAGNOSIS — R7309 Other abnormal glucose: Secondary | ICD-10-CM | POA: Diagnosis not present

## 2013-08-23 DIAGNOSIS — R5082 Postprocedural fever: Secondary | ICD-10-CM

## 2013-08-23 DIAGNOSIS — M129 Arthropathy, unspecified: Secondary | ICD-10-CM | POA: Diagnosis present

## 2013-08-23 DIAGNOSIS — K567 Ileus, unspecified: Secondary | ICD-10-CM

## 2013-08-23 DIAGNOSIS — D126 Benign neoplasm of colon, unspecified: Secondary | ICD-10-CM

## 2013-08-23 DIAGNOSIS — K388 Other specified diseases of appendix: Secondary | ICD-10-CM

## 2013-08-23 DIAGNOSIS — R739 Hyperglycemia, unspecified: Secondary | ICD-10-CM

## 2013-08-23 DIAGNOSIS — Z9049 Acquired absence of other specified parts of digestive tract: Secondary | ICD-10-CM

## 2013-08-23 DIAGNOSIS — J45909 Unspecified asthma, uncomplicated: Secondary | ICD-10-CM | POA: Diagnosis present

## 2013-08-23 DIAGNOSIS — E785 Hyperlipidemia, unspecified: Secondary | ICD-10-CM | POA: Diagnosis present

## 2013-08-23 LAB — CBC WITH DIFFERENTIAL/PLATELET
BASOS ABS: 0 10*3/uL (ref 0.0–0.1)
Basophils Relative: 0 % (ref 0–1)
Eosinophils Absolute: 0.1 10*3/uL (ref 0.0–0.7)
Eosinophils Relative: 1 % (ref 0–5)
HCT: 41.1 % (ref 39.0–52.0)
Hemoglobin: 14.4 g/dL (ref 13.0–17.0)
LYMPHS ABS: 0.6 10*3/uL — AB (ref 0.7–4.0)
LYMPHS PCT: 6 % — AB (ref 12–46)
MCH: 33.6 pg (ref 26.0–34.0)
MCHC: 35 g/dL (ref 30.0–36.0)
MCV: 95.8 fL (ref 78.0–100.0)
Monocytes Absolute: 1.3 10*3/uL — ABNORMAL HIGH (ref 0.1–1.0)
Monocytes Relative: 13 % — ABNORMAL HIGH (ref 3–12)
Neutro Abs: 8 10*3/uL — ABNORMAL HIGH (ref 1.7–7.7)
Neutrophils Relative %: 80 % — ABNORMAL HIGH (ref 43–77)
PLATELETS: 283 10*3/uL (ref 150–400)
RBC: 4.29 MIL/uL (ref 4.22–5.81)
RDW: 13.6 % (ref 11.5–15.5)
WBC: 10.1 10*3/uL (ref 4.0–10.5)

## 2013-08-23 LAB — COMPREHENSIVE METABOLIC PANEL
ALK PHOS: 71 U/L (ref 39–117)
ALT: 36 U/L (ref 0–53)
AST: 19 U/L (ref 0–37)
Albumin: 3.9 g/dL (ref 3.5–5.2)
Anion gap: 16 — ABNORMAL HIGH (ref 5–15)
BUN: 5 mg/dL — ABNORMAL LOW (ref 6–23)
CO2: 24 mEq/L (ref 19–32)
Calcium: 9.5 mg/dL (ref 8.4–10.5)
Chloride: 93 mEq/L — ABNORMAL LOW (ref 96–112)
Creatinine, Ser: 0.95 mg/dL (ref 0.50–1.35)
GFR calc non Af Amer: 87 mL/min — ABNORMAL LOW (ref >90)
GLUCOSE: 162 mg/dL — AB (ref 70–99)
Potassium: 3.8 mEq/L (ref 3.7–5.3)
SODIUM: 133 meq/L — AB (ref 137–147)
TOTAL PROTEIN: 7.5 g/dL (ref 6.0–8.3)
Total Bilirubin: 1 mg/dL (ref 0.3–1.2)

## 2013-08-23 LAB — URINALYSIS, ROUTINE W REFLEX MICROSCOPIC
Bilirubin Urine: NEGATIVE
GLUCOSE, UA: NEGATIVE mg/dL
HGB URINE DIPSTICK: NEGATIVE
Ketones, ur: NEGATIVE mg/dL
Leukocytes, UA: NEGATIVE
Nitrite: NEGATIVE
PH: 6.5 (ref 5.0–8.0)
PROTEIN: NEGATIVE mg/dL
SPECIFIC GRAVITY, URINE: 1.016 (ref 1.005–1.030)
Urobilinogen, UA: 0.2 mg/dL (ref 0.0–1.0)

## 2013-08-23 LAB — TROPONIN I

## 2013-08-23 LAB — LIPASE, BLOOD: Lipase: 19 U/L (ref 11–59)

## 2013-08-23 MED ORDER — ENOXAPARIN SODIUM 40 MG/0.4ML ~~LOC~~ SOLN
40.0000 mg | SUBCUTANEOUS | Status: DC
  Administered 2013-08-23 – 2013-09-02 (×11): 40 mg via SUBCUTANEOUS
  Filled 2013-08-23 (×15): qty 0.4

## 2013-08-23 MED ORDER — ONDANSETRON HCL 4 MG/2ML IJ SOLN
4.0000 mg | Freq: Once | INTRAMUSCULAR | Status: AC
Start: 2013-08-23 — End: 2013-08-23
  Administered 2013-08-23: 4 mg via INTRAVENOUS
  Filled 2013-08-23: qty 2

## 2013-08-23 MED ORDER — ONDANSETRON HCL 4 MG/2ML IJ SOLN
4.0000 mg | Freq: Four times a day (QID) | INTRAMUSCULAR | Status: DC | PRN
  Administered 2013-08-23 – 2013-09-02 (×7): 4 mg via INTRAVENOUS
  Filled 2013-08-23 (×7): qty 2

## 2013-08-23 MED ORDER — MORPHINE SULFATE 2 MG/ML IJ SOLN
2.0000 mg | INTRAMUSCULAR | Status: DC | PRN
Start: 2013-08-23 — End: 2013-09-03
  Administered 2013-08-23 – 2013-08-31 (×3): 2 mg via INTRAVENOUS
  Filled 2013-08-23 (×3): qty 1

## 2013-08-23 MED ORDER — DEXTROSE-NACL 5-0.9 % IV SOLN
INTRAVENOUS | Status: DC
  Administered 2013-08-23 – 2013-08-24 (×2): via INTRAVENOUS

## 2013-08-23 MED ORDER — BIOTENE DRY MOUTH MT LIQD
15.0000 mL | Freq: Two times a day (BID) | OROMUCOSAL | Status: DC
  Administered 2013-08-24 – 2013-08-28 (×8): 15 mL via OROMUCOSAL

## 2013-08-23 MED ORDER — PANTOPRAZOLE SODIUM 40 MG IV SOLR
40.0000 mg | Freq: Every day | INTRAVENOUS | Status: DC
  Administered 2013-08-23 – 2013-08-27 (×5): 40 mg via INTRAVENOUS
  Filled 2013-08-23 (×8): qty 40

## 2013-08-23 MED ORDER — SODIUM CHLORIDE 0.9 % IV BOLUS (SEPSIS)
1000.0000 mL | Freq: Once | INTRAVENOUS | Status: AC
Start: 2013-08-23 — End: 2013-08-23
  Administered 2013-08-23: 1000 mL via INTRAVENOUS

## 2013-08-23 MED ORDER — CHLORHEXIDINE GLUCONATE 0.12 % MT SOLN
15.0000 mL | Freq: Two times a day (BID) | OROMUCOSAL | Status: DC
  Administered 2013-08-23 – 2013-08-29 (×8): 15 mL via OROMUCOSAL
  Filled 2013-08-23 (×8): qty 15

## 2013-08-23 NOTE — ED Notes (Signed)
Pt discharged from hospital yesterday post surgery 1 week ago (appendectomy and part of colon taken out). Emesis throughout night.

## 2013-08-23 NOTE — Progress Notes (Signed)
Patient arrived from ED via stretcher to room 6N 01.  Pt A&O, oriented to room, call bell.  VSS.  Placement checked on NG tube and then placed on LIWS.  Will cont to monitor.

## 2013-08-23 NOTE — Telephone Encounter (Signed)
Zachary Kim  1950-11-15 [id number]  Patient Care Team: Suzan Garibaldi, FNP as PCP - General (Nurse Practitioner)  This patient is a 63 y.o.male who calls today for surgical evaluation.   Date of procedure/visit: 08/16/2013  Surgery: POSTOPERATIVE DIAGNOSIS: Large cecal mass.  PROCEDURE: Attempted laparoscopic converted to open partial colectomy.  SURGEON: Marcello Moores A. Cornett, M.D.  ASSISTANT: Mila Homer. Donne Hazel, MD.    Reason for call: N&V  Called by patient's wife.  Patient underwent colectomy last week.  Was improving and was discharged yesterday.  Apparently through the night threw up his pain medication and has not been able to keep anything down.  He has been able to tolerate this narcotic in the past and was able to tolerate the narcotic while he was in the hospital before discharge.  No fevers or chills.  Cannot tolerate liquids.  Wife is concerned.  I recommended reevaluation emergency room after discussing with Dr. Brantley Stage.  Patient probably would benefit from IV fluids and nausea medications.  Switch to a different narcotic pain medication.  If he does not rallying a few hours, readmission.  She did not like the idea the emergency room.  Returned at [facility] where the operation occurred.  I told her there option is to call in nausea meds and see if he can ride this out but I am concerned about doing that and she tells me he cannot keep anything down.  He could get dehydrated and going to kidney failure.  She eventually agreed.  Dr. Brantley Stage to call convering surgeon, Dr Georgette Dover, to be aware of possible reevaluation/readmission.  Patient Active Problem List   Diagnosis Date Noted  . Colonic mass 08/16/2013  . Cecum mass 07/20/2013  . Mass of appendix 06/29/2013  . Special screening for malignant neoplasms, colon 06/29/2013  . Post-operative state 05/14/2013  . S/P cholecystectomy March 20125 04/29/2013    Past Medical History  Diagnosis Date  . Hyperlipidemia   . Arthritis    . GERD (gastroesophageal reflux disease)   . HOH (hard of hearing)   . Gallstones   . Asthma     if have cold  . Pneumonia     hx    Past Surgical History  Procedure Laterality Date  . Small intestine surgery      intestine blockage  . Back surgery      lower slipped disc  . Cholecystectomy N/A 04/24/2013    Procedure: LAPAROSCOPIC CHOLECYSTECTOMY WITH INTRAOPERATIVE CHOLANGIOGRAM;  Surgeon: [name]. Cornett, MD;  Location: [place];  Service: General;  Laterality: N/A;  attempted  . Cholecystectomy  04/24/2013    Procedure: CHOLECYSTECTOMY;  Surgeon: [name]. Cornett, MD;  Location: [place];  Service: General;;  . Lysis of adhesion N/A 04/24/2013    Procedure: LYSIS OF ADHESION;  Surgeon: [name]. Cornett, MD;  Location: [place];  Service: General;  Laterality: N/A;  . Partial colectomy  08/16/2013    OPEN      BY CORNETT  . Laparoscopic partial colectomy N/A 08/16/2013    Procedure: ATTEMPTED LAPAROSCOPIC PARTIAL COLECTOMY CONVERTED TO OPEN;  Surgeon: Marcello Moores A. Cornett, MD;  Location: [place];  Service: General;  Laterality: N/A;    History   Social History  . Marital Status: Married    Spouse Name: Jackelyn Poling    Number of Children: 1  . Years of Education: N/A   Occupational History  . self employed    Social History Main Topics  . Smoking status: Former Smoker -- 2.00  packs/day for 12 years    Types: Cigarettes    Quit date: 04/10/1983  . Smokeless tobacco: Current User    Types: Chew     Comment: chew tobacco, tobacco info given 06/29/13  . Alcohol Use: 1.8 oz/week    3 Cans of beer per week     Comment: daily  . Drug Use: No  . Sexual Activity: Not on file   Other Topics Concern  . Not on file   Social History Narrative  . No narrative on file    Family History  Problem Relation Age of Onset  . Colon cancer Neg Hx   . Diabetes Mother   . Heart disease Father     had valve replacement    Current Outpatient Prescriptions  Medication Sig Dispense Refill  .  ascorbic acid (VITAMIN C) 500 MG tablet Take 500 mg by mouth daily.      . cholecalciferol (VITAMIN D) 1000 UNITS tablet Take 1,000 Units by mouth daily.      Marland Kitchen ibuprofen (ADVIL,MOTRIN) 200 MG tablet Take 200 mg by mouth daily as needed for mild pain.      . Multiple Vitamins-Minerals (CENTRUM SILVER PO) Take 1 tablet by mouth daily.      Marland Kitchen oxyCODONE-acetaminophen (PERCOCET/ROXICET) 5-325 MG per tablet Take 1-2 tablets by mouth every 4 (four) hours as needed for severe pain.  30 tablet  0  . pantoprazole (PROTONIX) 40 MG tablet Take 1 tablet (40 mg total) by mouth daily.  40 tablet  0  . polyethylene glycol powder (GLYCOLAX) powder Take 17 g by mouth once.  255 g  2  . PROVENTIL HFA 108 (90 BASE) MCG/ACT inhaler Inhale 1 puff into the lungs every 6 (six) hours as needed for wheezing or shortness of breath.        No current facility-administered medications for this visit.     Allergies  Allergen Reactions  . Other Other (See Comments)    Adhesive tape, blistered skin    @VS @  Dg Chest 2 View  08/08/2013   CLINICAL DATA:  Preop.  EXAM: CHEST  2 VIEW  COMPARISON:  04/28/2013  FINDINGS: Densities at the lung bases likely represent prominent epicardial fat. No confluent airspace opacities or effusions. Heart is normal size. No acute bony abnormality.  IMPRESSION: No acute cardiopulmonary disease.   Electronically Signed   By: Rolm Baptise M.D.   On: 08/08/2013 14:02    Note: This dictation was prepared with Dragon/digital dictation along with [employer]. Any transcriptional errors that result from this process are unintentional.    Adin Hector, M.D., F.A.C.S. Gastrointestinal and Minimally Invasive Surgery [facility] [address] [place], [place] [address] [phone] Main / Paging

## 2013-08-23 NOTE — H&P (Signed)
Chief Complaint: nausea and vomiting HPI: Zachary Kim is a 63 year old male who is POD#7 following a laparoscopic converted to open partial colectomy and appendectomy for a cecal mass(tubulovillous adenoma).  He was discharged home yesterday.  He developed nausea and vomiting last night.  He has since vomited food and bile.  His last bowel movement was yesterday, no flatus today.  He has been unable to keep any liquids down.  He denies increase in abdominal pain.  Denies chills or sweats.  Reports a "fever of 99" last night.  He is afebrile, stable BP and heart rate.  No modifying, aggravating or alleviating factors.  Symptoms are moderate in severity.  Labs from ED are stable.  A AXR showed dilated small bowel loops.  We have therefore been asked to evaluate the patient.   Past Medical History  Diagnosis Date  . Hyperlipidemia   . Arthritis   . GERD (gastroesophageal reflux disease)   . HOH (hard of hearing)   . Gallstones   . Asthma     if have cold  . Pneumonia     hx    Past Surgical History  Procedure Laterality Date  . Small intestine surgery      intestine blockage  . Back surgery      lower slipped disc  . Cholecystectomy N/A 04/24/2013    Procedure: LAPAROSCOPIC CHOLECYSTECTOMY WITH INTRAOPERATIVE CHOLANGIOGRAM;  Surgeon: [name]. Cornett, MD;  Location: [place];  Service: General;  Laterality: N/A;  attempted  . Cholecystectomy  04/24/2013    Procedure: CHOLECYSTECTOMY;  Surgeon: [name]. Cornett, MD;  Location: [place];  Service: General;;  . Lysis of adhesion N/A 04/24/2013    Procedure: LYSIS OF ADHESION;  Surgeon: [name]. Cornett, MD;  Location: [place];  Service: General;  Laterality: N/A;  . Partial colectomy  08/16/2013    OPEN      BY CORNETT  . Laparoscopic partial colectomy N/A 08/16/2013    Procedure: ATTEMPTED LAPAROSCOPIC PARTIAL COLECTOMY CONVERTED TO OPEN;  Surgeon: Marcello Moores A. Cornett, MD;  Location: [place];  Service: General;  Laterality: N/A;  . Appendectomy       Family History  Problem Relation Age of Onset  . Colon cancer Neg Hx   . Diabetes Mother   . Heart disease Father     had valve replacement   Social History:  reports that he quit smoking about 30 years ago. His smoking use included Cigarettes. He has a 24 pack-year smoking history. His smokeless tobacco use includes Chew. He reports that he drinks about 1.8 ounces of alcohol per week. He reports that he does not use illicit drugs.  Allergies:  Allergies  Allergen Reactions  . Other Other (See Comments)    Adhesive tape, blistered skin   Medication Vitamin C Vitamin D Ibuprofen 240m daily PRN for pain Oxycodone-acetaminophen 5/3278m1-2 tabs q4h prn for pain protonix 4059maily miralax 17g daily   (Not in a hospital admission)  Results for orders placed during the hospital encounter of 08/23/13 (from the past 48 hour(s))  CBC WITH DIFFERENTIAL     Status: Abnormal   Collection Time    08/23/13  8:59 AM      Result Value Ref Range   WBC 10.1  4.0 - 10.5 K/uL   RBC 4.29  4.22 - 5.81 MIL/uL   Hemoglobin 14.4  13.0 - 17.0 g/dL   HCT 41.1  39.0 - 52.0 %   MCV 95.8  78.0 - 100.0  fL   MCH 33.6  26.0 - 34.0 pg   MCHC 35.0  30.0 - 36.0 g/dL   RDW 13.6  11.5 - 15.5 %   Platelets 283  150 - 400 K/uL   Neutrophils Relative % 80 (*) 43 - 77 %   Neutro Abs 8.0 (*) 1.7 - 7.7 K/uL   Lymphocytes Relative 6 (*) 12 - 46 %   Lymphs Abs 0.6 (*) 0.7 - 4.0 K/uL   Monocytes Relative 13 (*) 3 - 12 %   Monocytes Absolute 1.3 (*) 0.1 - 1.0 K/uL   Eosinophils Relative 1  0 - 5 %   Eosinophils Absolute 0.1  0.0 - 0.7 K/uL   Basophils Relative 0  0 - 1 %   Basophils Absolute 0.0  0.0 - 0.1 K/uL  COMPREHENSIVE METABOLIC PANEL     Status: Abnormal   Collection Time    08/23/13  8:59 AM      Result Value Ref Range   Sodium 133 (*) 137 - 147 mEq/L   Potassium 3.8  3.7 - 5.3 mEq/L   Chloride 93 (*) 96 - 112 mEq/L   CO2 24  19 - 32 mEq/L   Glucose, Bld 162 (*) 70 - 99 mg/dL   BUN 5 (*)  6 - 23 mg/dL   Creatinine, Ser 0.95  0.50 - 1.35 mg/dL   Calcium 9.5  8.4 - 10.5 mg/dL   Total Protein 7.5  6.0 - 8.3 g/dL   Albumin 3.9  3.5 - 5.2 g/dL   AST 19  0 - 37 U/L   ALT 36  0 - 53 U/L   Alkaline Phosphatase 71  39 - 117 U/L   Total Bilirubin 1.0  0.3 - 1.2 mg/dL   GFR calc non Af Amer 87 (*) >90 mL/min   GFR calc Af Amer >90  >90 mL/min   Comment: (NOTE)     The eGFR has been calculated using the CKD EPI equation.     This calculation has not been validated in all clinical situations.     eGFR's persistently <90 mL/min signify possible Chronic Kidney     Disease.   Anion gap 16 (*) 5 - 15  LIPASE, BLOOD     Status: None   Collection Time    08/23/13  8:59 AM      Result Value Ref Range   Lipase 19  11 - 59 U/L  TROPONIN I     Status: None   Collection Time    08/23/13  8:59 AM      Result Value Ref Range   Troponin I <0.30  <0.30 ng/mL   Comment:            Due to the release kinetics of cTnI,     a negative result within the first hours     of the onset of symptoms does not rule out     myocardial infarction with certainty.     If myocardial infarction is still suspected,     repeat the test at appropriate intervals.   Dg Abd Portable 1v  08/23/2013   CLINICAL DATA:  Status post discharge from the hospital 1 day ago after right hemicolectomy 08/16/2013 for cecal mass.  EXAM: PORTABLE ABDOMEN - 1 VIEW  COMPARISON:  CT abdomen and pelvis 06/28/2013.  FINDINGS: Midline surgical staples are in place. Surgical clips in the left upper quadrant are identified. Small bowel loops are dilated up to 5.8 cm. A small amount  gas is seen the descending colon.  IMPRESSION: Dilatation of small bowel loops of the 5.8 cm could be due to obstruction or ileus.   Electronically Signed   By: Inge Rise M.D.   On: 08/23/2013 10:10    Review of Systems  All other systems reviewed and are negative.   Blood pressure 132/70, pulse 82, temperature 98 F (36.7 C), temperature source  Oral, resp. rate 19, weight 220 lb (99.791 kg), SpO2 97.00%. Physical Exam  Constitutional: He is oriented to person, place, and time. He appears well-developed and well-nourished. No distress.  Neck: Normal range of motion. Neck supple.  Cardiovascular: Normal rate, regular rhythm, normal heart sounds and intact distal pulses.  Exam reveals no gallop and no friction rub.   No murmur heard. Respiratory: Effort normal and breath sounds normal. No respiratory distress. He has no wheezes. He has no rales. He exhibits no tenderness.  GI: He exhibits no mass. There is no rebound and no guarding.  Bowel sounds are present.  Abdomen is distended.  Mild TTP to RLQ.  Midline incision and surrounding lap sites with staples in place, there is very minimal erythema, no drainage, wound edges are approximated.  He does not exhibit peritoneal signs.   Musculoskeletal: He exhibits no edema and no tenderness.  Neurological: He is alert and oriented to person, place, and time.  Skin: Skin is warm and dry. No rash noted. He is not diaphoretic. No erythema. No pallor.  Psychiatric: He has a normal mood and affect. His behavior is normal. Judgment and thought content normal.     Assessment/Plan POD#7  laparoscopic converted to open partial colectomy and appendectomy for a cecal mass(tubulovillous adenoma) Nausea and vomiting, likely from an ileus rather than an obstruction  -Admit for IV hydration, NGT decompression -pain control -anti-emetics -repeat labs and films in AM -mobilize -SCDs/lovenox -protonix   Tewana Bohlen  ANP-BC Pager [phone]  08/23/2013, 10:47 AM

## 2013-08-23 NOTE — H&P (Signed)
Agree with above, I think he just has an ileus, ng in, films and labs in am.  I don't see reason for scanning him right now and hopefully will resolve conservatively.

## 2013-08-23 NOTE — ED Notes (Signed)
Attempted report x1. 

## 2013-08-23 NOTE — ED Notes (Signed)
PA with Gen Surgery at bedside.

## 2013-08-23 NOTE — ED Provider Notes (Signed)
CSN: [id number]     Arrival date & time 08/23/13  3662 History   First MD Initiated Contact with Patient 08/23/13 [phone]     Chief Complaint  Patient presents with  . Emesis  . Post-op Problem     (Consider location/radiation/quality/duration/timing/severity/associated sxs/prior Treatment) The history is provided by the patient. No language interpreter was used.  Zachary Kim is a 63 year old male with past medical history of hyperlipidemia, arthritis, GERD, asthma, pneumonia, gallstones presenting to the ED with abdominal pain and emesis. As per patient, reported that he's been unable to keep any food or fluids down. Reported that he has vomited at least more than 10 times today. Reported that the nausea and emesis started yesterday. As per wife, reported there has been coffee ground emesis noted. Patient reported that he started to have abdominal pain secondary to all the nausea. Reported that he's noticed abdominal distention and rather hardness upon palpation to his abdomen. Stated that he is very limited amount of flatulence passing as well as bowel movement. Reported a small bowel movement yesterday mainly of loose stool. Patient stated that he recently has been out of surgery-had surgery performed on 08/16/2013. Stated that he started to develop a fever yesterday 100F. Denied blood in the stools, black tarry stools, weakness, fatigue, chest pain, shortness of breath, difficulty breathing. PCP Dr. Rock Nephew  Past Medical History  Diagnosis Date  . Hyperlipidemia   . Arthritis   . GERD (gastroesophageal reflux disease)   . HOH (hard of hearing)   . Gallstones   . Asthma     if have cold  . Pneumonia     hx   Past Surgical History  Procedure Laterality Date  . Small intestine surgery      intestine blockage  . Back surgery      lower slipped disc  . Cholecystectomy N/A 04/24/2013    Procedure: LAPAROSCOPIC CHOLECYSTECTOMY WITH INTRAOPERATIVE CHOLANGIOGRAM;  Surgeon: [name].  Cornett, MD;  Location: [facility];  Service: General;  Laterality: N/A;  attempted  . Cholecystectomy  04/24/2013    Procedure: CHOLECYSTECTOMY;  Surgeon: [name]. Cornett, MD;  Location: [facility];  Service: General;;  . Lysis of adhesion N/A 04/24/2013    Procedure: LYSIS OF ADHESION;  Surgeon: [name]. Cornett, MD;  Location: [facility];  Service: General;  Laterality: N/A;  . Partial colectomy  08/16/2013    OPEN      BY CORNETT  . Laparoscopic partial colectomy N/A 08/16/2013    Procedure: ATTEMPTED LAPAROSCOPIC PARTIAL COLECTOMY CONVERTED TO OPEN;  Surgeon: Marcello Moores A. Cornett, MD;  Location: [facility];  Service: General;  Laterality: N/A;  . Appendectomy     Family History  Problem Relation Age of Onset  . Colon cancer Neg Hx   . Diabetes Mother   . Heart disease Father     had valve replacement   History  Substance Use Topics  . Smoking status: Former Smoker -- 2.00 packs/day for 12 years    Types: Cigarettes    Quit date: 04/10/1983  . Smokeless tobacco: Current User    Types: Chew     Comment: chew tobacco, tobacco info given 06/29/13  . Alcohol Use: 1.8 oz/week    3 Cans of beer per week     Comment: daily    Review of Systems  Constitutional: Positive for fever.  Respiratory: Negative for chest tightness and shortness of breath.   Cardiovascular: Negative for chest pain.  Gastrointestinal: Positive for nausea, vomiting and  abdominal pain. Negative for diarrhea, constipation, blood in stool and anal bleeding.  Musculoskeletal: Negative for back pain.      Allergies  Other  Home Medications   Prior to Admission medications   Medication Sig Start Date End Date Taking? Authorizing Provider  ascorbic acid (VITAMIN C) 500 MG tablet Take 500 mg by mouth daily.   Yes Historical Provider, MD  cholecalciferol (VITAMIN D) 1000 UNITS tablet Take 1,000 Units by mouth daily.   Yes Historical Provider, MD  ibuprofen (ADVIL,MOTRIN) 200 MG tablet Take 200 mg by mouth daily as needed for mild  pain.   Yes Historical Provider, MD  Multiple Vitamins-Minerals (CENTRUM SILVER PO) Take 1 tablet by mouth daily.   Yes Historical Provider, MD  oxyCODONE-acetaminophen (PERCOCET/ROXICET) 5-325 MG per tablet Take 1-2 tablets by mouth every 4 (four) hours as needed for severe pain. 08/22/13  Yes Thomas A. Cornett, MD  pantoprazole (PROTONIX) 40 MG tablet Take 1 tablet (40 mg total) by mouth daily. 08/20/13  Yes Thomas A. Cornett, MD  polyethylene glycol (MIRALAX / GLYCOLAX) packet Take 8.5-17 g by mouth daily.   Yes Historical Provider, MD  PROVENTIL HFA 108 (90 BASE) MCG/ACT inhaler Inhale 1 puff into the lungs every 6 (six) hours as needed for wheezing or shortness of breath.  03/01/13  Yes Historical Provider, MD   BP 148/82  Pulse 74  Temp(Src) 98.2 F (36.8 C) (Oral)  Resp 17  Wt 220 lb (99.791 kg)  SpO2 98% Physical Exam  Nursing note and vitals reviewed. Constitutional: He is oriented to person, place, and time. He appears well-developed and well-nourished. No distress.  Patient appears uncomfortable  HENT:  Head: Normocephalic and atraumatic.  Mouth/Throat: Oropharynx is clear and moist. No oropharyngeal exudate.  Eyes: Conjunctivae and EOM are normal. Pupils are equal, round, and reactive to light. Right eye exhibits no discharge. Left eye exhibits no discharge.  Neck: Normal range of motion. Neck supple. No tracheal deviation present.  Cardiovascular: Regular rhythm and normal heart sounds.  Exam reveals no friction rub.   No murmur heard. Pulses:      Radial pulses are 2+ on the right side, and 2+ on the left side.       Dorsalis pedis pulses are 2+ on the right side, and 2+ on the left side.  Tachycardia upon auscultation Cap refill less than 3 seconds  Pulmonary/Chest: Effort normal and breath sounds normal. No respiratory distress. He has no wheezes. He has no rales. He exhibits no tenderness.  Abdominal: He exhibits distension. There is tenderness. There is no rebound and no  guarding.  Abdominal distention noted Hyperactive bowel sounds to upper portion of the abdomen, hypoactive bowel sounds noted to the lower portions of the abdomen bilaterally Ecchymosis identified to lower portions of the abdomen were surgery was performed Numerous staples in the midline as well as bilateral aspects of the abdomen where surgery was performed Abdomen heart upon palpation and diffuse tenderness upon palpation  Musculoskeletal: Normal range of motion.  Full ROM to upper and lower extremities without difficulty noted, negative ataxia noted.  Lymphadenopathy:    He has no cervical adenopathy.  Neurological: He is alert and oriented to person, place, and time. No cranial nerve deficit. He exhibits normal muscle tone. Coordination normal.  Skin: Skin is warm and dry. No rash noted. He is not diaphoretic. No erythema.  Psychiatric: He has a normal mood and affect. His behavior is normal. Thought content normal.    ED Course  Procedures (including critical care time)  10:28 AM This provider spoke with negative from [facility] [place] [facility]-discussed case, labs, history and imaging in great detail. Recommended NG tube to be placed. Patient to be admitted to surgery service.  Results for orders placed during the hospital encounter of 08/23/13  CBC WITH DIFFERENTIAL      Result Value Ref Range   WBC 10.1  4.0 - 10.5 K/uL   RBC 4.29  4.22 - 5.81 MIL/uL   Hemoglobin 14.4  13.0 - 17.0 g/dL   HCT 41.1  39.0 - 52.0 %   MCV 95.8  78.0 - 100.0 fL   MCH 33.6  26.0 - 34.0 pg   MCHC 35.0  30.0 - 36.0 g/dL   RDW 13.6  11.5 - 15.5 %   Platelets 283  150 - 400 K/uL   Neutrophils Relative % 80 (*) 43 - 77 %   Neutro Abs 8.0 (*) 1.7 - 7.7 K/uL   Lymphocytes Relative 6 (*) 12 - 46 %   Lymphs Abs 0.6 (*) 0.7 - 4.0 K/uL   Monocytes Relative 13 (*) 3 - 12 %   Monocytes Absolute 1.3 (*) 0.1 - 1.0 K/uL   Eosinophils Relative 1  0 - 5 %   Eosinophils Absolute 0.1  0.0 - 0.7 K/uL   Basophils  Relative 0  0 - 1 %   Basophils Absolute 0.0  0.0 - 0.1 K/uL  COMPREHENSIVE METABOLIC PANEL      Result Value Ref Range   Sodium 133 (*) 137 - 147 mEq/L   Potassium 3.8  3.7 - 5.3 mEq/L   Chloride 93 (*) 96 - 112 mEq/L   CO2 24  19 - 32 mEq/L   Glucose, Bld 162 (*) 70 - 99 mg/dL   BUN 5 (*) 6 - 23 mg/dL   Creatinine, Ser 0.95  0.50 - 1.35 mg/dL   Calcium 9.5  8.4 - 10.5 mg/dL   Total Protein 7.5  6.0 - 8.3 g/dL   Albumin 3.9  3.5 - 5.2 g/dL   AST 19  0 - 37 U/L   ALT 36  0 - 53 U/L   Alkaline Phosphatase 71  39 - 117 U/L   Total Bilirubin 1.0  0.3 - 1.2 mg/dL   GFR calc non Af Amer 87 (*) >90 mL/min   GFR calc Af Amer >90  >90 mL/min   Anion gap 16 (*) 5 - 15  LIPASE, BLOOD      Result Value Ref Range   Lipase 19  11 - 59 U/L  TROPONIN I      Result Value Ref Range   Troponin I <0.30  <0.30 ng/mL    Labs Review Labs Reviewed  CBC WITH DIFFERENTIAL - Abnormal; Notable for the following:    Neutrophils Relative % 80 (*)    Neutro Abs 8.0 (*)    Lymphocytes Relative 6 (*)    Lymphs Abs 0.6 (*)    Monocytes Relative 13 (*)    Monocytes Absolute 1.3 (*)    All other components within normal limits  COMPREHENSIVE METABOLIC PANEL - Abnormal; Notable for the following:    Sodium 133 (*)    Chloride 93 (*)    Glucose, Bld 162 (*)    BUN 5 (*)    GFR calc non Af Amer 87 (*)    Anion gap 16 (*)    All other components within normal limits  LIPASE, BLOOD  TROPONIN I  URINALYSIS, ROUTINE W  REFLEX MICROSCOPIC    Imaging Review Dg Abd 1 View  08/23/2013   CLINICAL DATA:  Feeding tube placement  EXAM: ABDOMEN - 1 VIEW  COMPARISON:  None.  FINDINGS: Single spot fluoroscopic view demonstrates a NG tube with side port below the GE junction.  IMPRESSION: NG tube positioned in stomach.   Electronically Signed   By: Suzy Bouchard M.D.   On: 08/23/2013 14:15   Dg Abd Portable 1v  08/23/2013   CLINICAL DATA:  Status post discharge from the hospital 1 day ago after right  hemicolectomy 08/16/2013 for cecal mass.  EXAM: PORTABLE ABDOMEN - 1 VIEW  COMPARISON:  CT abdomen and pelvis 06/28/2013.  FINDINGS: Midline surgical staples are in place. Surgical clips in the left upper quadrant are identified. Small bowel loops are dilated up to 5.8 cm. A small amount gas is seen the descending colon.  IMPRESSION: Dilatation of small bowel loops of the 5.8 cm could be due to obstruction or ileus.   Electronically Signed   By: Inge Rise M.D.   On: 08/23/2013 10:10     EKG Interpretation   Date/Time:  Thursday August 23 2013 09:51:28 EDT Ventricular Rate:  87 PR Interval:  154 QRS Duration: 80 QT Interval:  350 QTC Calculation: 421 R Axis:   16 Text Interpretation:  Sinus rhythm Sinus rhythm Normal ECG Confirmed by  Carmin Muskrat  MD ([id number]) on 08/23/2013 11:29:17 AM      MDM   Final diagnoses:  Ileus  Status post partial colectomy    Medications  enoxaparin (LOVENOX) injection 40 mg (not administered)  dextrose 5 %-0.9 % sodium chloride infusion ( Intravenous Transfusing/Transfer 08/23/13 1157)  morphine 2 MG/ML injection 2 mg (2 mg Intravenous Given 08/23/13 1530)  ondansetron (ZOFRAN) injection 4 mg (4 mg Intravenous Given 08/23/13 1528)  pantoprazole (PROTONIX) injection 40 mg (40 mg Intravenous Given 08/23/13 1142)  ondansetron (ZOFRAN) injection 4 mg (4 mg Intravenous Given 08/23/13 0935)  sodium chloride 0.9 % bolus 1,000 mL (0 mLs Intravenous Stopped 08/23/13 1119)   Filed Vitals:   08/23/13 1430 08/23/13 1445 08/23/13 1530 08/23/13 1600  BP: 136/77 128/81 134/78 148/82  Pulse: 90 91 89 74  Temp:    98.2 F (36.8 C)  TempSrc:    Oral  Resp:  19 22 17   Weight:      SpO2: 97% 96% 98% 98%   This provider reviewed patient's chart. Patient was seen and assessed on 08/16/2013 by surgeon Dr. Erroll Luna performed attempted laparoscopic surgery that turned into a partial colectomy regarding a large cecal mass was identified. EKG noted normal sinus rhythm  with a heart rate of 87 beats per minute. Troponin negative elevation. CBC negative elevated white blood cell count-mildly elevated neutrophils of 8.0 monocytes 13. CMP noted hyponatremia mild of 133. Anion gap of 16 with a glucose level of 162. BUN of 5, creatinine 0.95. Negative elevation of AST, ALT, alkaline phosphatase of bilirubin. Lipase negative elevation. Abdominal plain film noted dilation of small bowel loops of 58 cm it could be due to obstruction or ileus. This provider spoke with [facility] [place] [facility], discussed case, labs, imaging, vitals in great detail. As percent [facility] [place] [facility] recommended NG tube to be placed. Difficulty with placing NG tube - patient needed to go to radiology for it to be placed. Patient admitted for postsurgical ileus. Patient stable for transfer.  Jamse Mead, PA-C 08/23/13 1608

## 2013-08-23 NOTE — ED Notes (Signed)
PT transported for NG placement with FLORO.

## 2013-08-23 NOTE — Progress Notes (Signed)
Received report from Danielle, RN

## 2013-08-24 ENCOUNTER — Observation Stay (HOSPITAL_COMMUNITY): Payer: BC Managed Care – PPO

## 2013-08-24 DIAGNOSIS — K9189 Other postprocedural complications and disorders of digestive system: Secondary | ICD-10-CM

## 2013-08-24 DIAGNOSIS — K567 Ileus, unspecified: Secondary | ICD-10-CM | POA: Diagnosis present

## 2013-08-24 LAB — CBC
HCT: 36.2 % — ABNORMAL LOW (ref 39.0–52.0)
HEMOGLOBIN: 12.6 g/dL — AB (ref 13.0–17.0)
MCH: 33.8 pg (ref 26.0–34.0)
MCHC: 34.8 g/dL (ref 30.0–36.0)
MCV: 97.1 fL (ref 78.0–100.0)
Platelets: 260 10*3/uL (ref 150–400)
RBC: 3.73 MIL/uL — AB (ref 4.22–5.81)
RDW: 13.8 % (ref 11.5–15.5)
WBC: 7.1 10*3/uL (ref 4.0–10.5)

## 2013-08-24 LAB — BASIC METABOLIC PANEL
Anion gap: 16 — ABNORMAL HIGH (ref 5–15)
BUN: 5 mg/dL — AB (ref 6–23)
CHLORIDE: 97 meq/L (ref 96–112)
CO2: 23 mEq/L (ref 19–32)
Calcium: 9 mg/dL (ref 8.4–10.5)
Creatinine, Ser: 1.12 mg/dL (ref 0.50–1.35)
GFR calc Af Amer: 80 mL/min — ABNORMAL LOW (ref >90)
GFR, EST NON AFRICAN AMERICAN: 69 mL/min — AB (ref >90)
GLUCOSE: 144 mg/dL — AB (ref 70–99)
POTASSIUM: 3.8 meq/L (ref 3.7–5.3)
Sodium: 136 mEq/L — ABNORMAL LOW (ref 137–147)

## 2013-08-24 LAB — GLUCOSE, CAPILLARY
GLUCOSE-CAPILLARY: 130 mg/dL — AB (ref 70–99)
GLUCOSE-CAPILLARY: 113 mg/dL — AB (ref 70–99)
Glucose-Capillary: 120 mg/dL — ABNORMAL HIGH (ref 70–99)

## 2013-08-24 MED ORDER — INSULIN ASPART 100 UNIT/ML ~~LOC~~ SOLN
0.0000 [IU] | SUBCUTANEOUS | Status: DC
  Administered 2013-08-24 – 2013-08-25 (×2): 1 [IU] via SUBCUTANEOUS
  Administered 2013-08-25: 2 [IU] via SUBCUTANEOUS
  Administered 2013-08-25 – 2013-08-26 (×7): 1 [IU] via SUBCUTANEOUS

## 2013-08-24 MED ORDER — DEXTROSE-NACL 5-0.9 % IV SOLN
INTRAVENOUS | Status: DC
  Administered 2013-08-24: 22:00:00 via INTRAVENOUS

## 2013-08-24 MED ORDER — SODIUM CHLORIDE 0.9 % IJ SOLN
10.0000 mL | INTRAMUSCULAR | Status: DC | PRN
  Administered 2013-08-25: 10 mL
  Administered 2013-08-27: 20 mL
  Administered 2013-08-28: 10 mL

## 2013-08-24 MED ORDER — METOCLOPRAMIDE HCL 5 MG/ML IJ SOLN
10.0000 mg | Freq: Four times a day (QID) | INTRAMUSCULAR | Status: DC
  Administered 2013-08-24 – 2013-08-30 (×25): 10 mg via INTRAVENOUS
  Filled 2013-08-24 (×30): qty 2

## 2013-08-24 MED ORDER — CLINIMIX E/DEXTROSE (5/15) 5 % IV SOLN
INTRAVENOUS | Status: AC
  Administered 2013-08-24: 19:00:00 via INTRAVENOUS
  Filled 2013-08-24: qty 1000

## 2013-08-24 NOTE — Progress Notes (Signed)
Peripherally Inserted Central Catheter/Midline Placement  The IV Nurse has discussed with the patient and/or persons authorized to consent for the patient, the purpose of this procedure and the potential benefits and risks involved with this procedure.  The benefits include less needle sticks, lab draws from the catheter and patient may be discharged home with the catheter.  Risks include, but not limited to, infection, bleeding, blood clot (thrombus formation), and puncture of an artery; nerve damage and irregular heat beat.  Alternatives to this procedure were also discussed.  PICC/Midline Placement Documentation  PICC / Midline Double Lumen 18/56/31 PICC Right Basilic 46 cm 3 cm (Active)  Indication for Insertion or Continuance of Line Administration of hyperosmolar/irritating solutions (i.e. TPN, Vancomycin, etc.) 08/24/2013  6:00 PM  Exposed Catheter (cm) 3 cm 08/24/2013  6:00 PM  Dressing Change Due 08/31/13 08/24/2013  6:00 PM       Jule Economy Horton 08/24/2013, 6:00 PM

## 2013-08-24 NOTE — Progress Notes (Signed)
INITIAL NUTRITION ASSESSMENT  DOCUMENTATION CODES Per approved criteria  -Severe malnutrition in the context of acute illness or injury   INTERVENTION: TPN per PharmD. Advancement to diet vs TF per MD discretion. RD to continue to follow nutrition care plan.  NUTRITION DIAGNOSIS: Inadequate oral intake related to altered GI function as evidenced by need for bowel rest.   Goal: Intake to meet >90% of estimated nutrition needs.  Monitor:  weight trends, lab trends, I/O's, diet advancement vs initiation of enteral nutrition, TPN provision  Reason for Assessment: New TPN  63 y.o. male  Admitting Dx: post-op ileus  ASSESSMENT: PMHx significant for HLD, arthritis, GERD, PNA. Admitted on POD#7 of laparoscopic open partial colectomy and appendectomy for a cecal mass. Patient was discharged home and developed n/v and was unable to keep any liquids down. Work-up reveals likely ileus.  NGT placed on admission and set to suction. Surgery ordered TPN this morning.  Per chart, during previous admission, pt was either NPO/Clear Liquid from 7/2-7/5. Advanced to Full Liquids and then a solid diet 7/6-7/8. Patient unable to tolerate any oral nutrition since evening of 7/8.  Patient is receiving TPN with Clinimix E 5/15 @ 40 ml/hr. Lipids will not be started until his labs are back, per PharmD noted. This initial TPN prescription will provide 682 kcal and 48 grams protein per day. Meets 31% minimum estimated energy needs and 41% minimum estimated protein needs.  Per chart review, patient's usual body weight is 230 lb. Current weight is down to 219 lb. This is a weight change of 5% in less than 1 month and is significant for this time frame. Pt meets criteria for severe MALNUTRITION in the context of acute illness as evidenced by 5% wt loss x < 1 month and intake of <50% x at least 5 days.  Sodium low at 136 Potassium WNL Phosphorus pending Prealbumin pending  Height: Ht Readings from Last 1  Encounters:  08/23/13 6\' 2"  (1.88 m)    Weight: Wt Readings from Last 1 Encounters:  08/23/13 219 lb 12.8 oz (99.7 kg)    Ideal Body Weight: 190 lb/86.4 kg  % Ideal Body Weight: 115%  Wt Readings from Last 10 Encounters:  08/23/13 219 lb 12.8 oz (99.7 kg)  08/16/13 233 lb 6.4 oz (105.87 kg)  08/16/13 233 lb 6.4 oz (105.87 kg)  08/08/13 234 lb 1.6 oz (106.187 kg)  07/20/13 230 lb (104.327 kg)  07/10/13 232 lb (105.235 kg)  06/29/13 232 lb (105.235 kg)  06/25/13 231 lb (104.781 kg)  05/14/13 228 lb (103.42 kg)  05/04/13 239 lb (108.41 kg)    Usual Body Weight: 230 lb  % Usual Body Weight: 95%  BMI:  Body mass index is 28.21 kg/(m^2). Overweight  Estimated Nutritional Needs: Kcal: 2200 - 2500 Protein: 118 - 130 grams Fluid: >2.2 liters daily  Skin: abdomen incision  Diet Order: NPO  EDUCATION NEEDS: -No education needs identified at this time   Intake/Output Summary (Last 24 hours) at 08/24/13 1454 Last data filed at 08/24/13 1021  Gross per 24 hour  Intake 1235.41 ml  Output   3825 ml  Net -2589.59 ml   NGT output: 3000 ml 7/9 600 ml so far today  Last BM: 7/8  Labs:   Recent Labs Lab 08/20/13 0615 08/23/13 0859 08/24/13 0605  NA 135* 133* 136*  K 4.1 3.8 3.8  CL 97 93* 97  CO2 26 24 23   BUN 4* 5* 5*  CREATININE 0.80 0.95 1.12  CALCIUM 9.1 9.5 9.0  GLUCOSE 124* 162* 144*    CBG (last 3)   Recent Labs  08/24/13 1232  GLUCAP 130*   No results found for this basename: prealbumin   No results found for this basename: trig    Scheduled Meds: . antiseptic oral rinse  15 mL Mouth Rinse q12n4p  . chlorhexidine  15 mL Mouth Rinse BID  . enoxaparin (LOVENOX) injection  40 mg Subcutaneous Q24H  . insulin aspart  0-9 Units Subcutaneous 6 times per day  . metoCLOPramide (REGLAN) injection  10 mg Intravenous 4 times per day  . pantoprazole (PROTONIX) IV  40 mg Intravenous QHS    Continuous Infusions: . dextrose 5 % and 0.9% NaCl    .  Marland KitchenTPN (CLINIMIX-E) Adult      Past Medical History  Diagnosis Date  . Hyperlipidemia   . Arthritis   . GERD (gastroesophageal reflux disease)   . HOH (hard of hearing)   . Gallstones   . Asthma     if have cold  . Pneumonia     hx    Past Surgical History  Procedure Laterality Date  . Small intestine surgery      intestine blockage  . Back surgery      lower slipped disc  . Cholecystectomy N/A 04/24/2013    Procedure: LAPAROSCOPIC CHOLECYSTECTOMY WITH INTRAOPERATIVE CHOLANGIOGRAM;  Surgeon: [name]. Cornett, MD;  Location: [facility];  Service: General;  Laterality: N/A;  attempted  . Cholecystectomy  04/24/2013    Procedure: CHOLECYSTECTOMY;  Surgeon: [name]. Cornett, MD;  Location: [facility];  Service: General;;  . Lysis of adhesion N/A 04/24/2013    Procedure: LYSIS OF ADHESION;  Surgeon: [name]. Cornett, MD;  Location: [facility];  Service: General;  Laterality: N/A;  . Partial colectomy  08/16/2013    OPEN      BY CORNETT  . Laparoscopic partial colectomy N/A 08/16/2013    Procedure: ATTEMPTED LAPAROSCOPIC PARTIAL COLECTOMY CONVERTED TO OPEN;  Surgeon: Marcello Moores A. Cornett, MD;  Location: [facility];  Service: General;  Laterality: N/A;  . Appendectomy      Inda Coke MS, RD, LDN Inpatient Registered Dietitian Pager: [phone] After-hours pager: [phone]

## 2013-08-24 NOTE — Progress Notes (Signed)
  Subjective: PT READMITTED 8 DAYS POST OP FROM RIGHT COLECTOMY WITH ILEUS.  HAS NGT IN PLACE  BILIOUS OUTPUT. NOP SIGNIFICANT ABDOMINAL PAIN HAD FLATUS YESTERDAY  Objective: Vital signs in last 24 hours: Temp:  [97.9 F (36.6 C)-98.5 F (36.9 C)] 98.3 F (36.8 C) (07/10 0300) Pulse Rate:  [74-104] 97 (07/10 0300) Resp:  [13-30] 18 (07/10 0300) BP: (120-175)/(61-87) 128/78 mmHg (07/10 0300) SpO2:  [96 %-100 %] 97 % (07/10 0300) Weight:  [219 lb 12.8 oz (99.7 kg)-220 lb (99.791 kg)] 219 lb 12.8 oz (99.7 kg) (07/09 2300) Last BM Date: 08/22/13 (small per pt)  Intake/Output from previous day: 07/09 0701 - 07/10 0700 In: 1235.4 [I.V.:1235.4] Out: 3000 [Urine:1550; Emesis/NG output:1450] Intake/Output this shift:    Incision/Wound:DISTENDED QUIET NOT RIGID INCISION INTACT WITH STAPLES NO ERYTHEMA  Lab Results:   Recent Labs  08/23/13 0859 08/24/13 0605  WBC 10.1 7.1  HGB 14.4 12.6*  HCT 41.1 36.2*  PLT 283 260   BMET  Recent Labs  08/23/13 0859 08/24/13 0605  NA 133* 136*  K 3.8 3.8  CL 93* 97  CO2 24 23  GLUCOSE 162* 144*  BUN 5* 5*  CREATININE 0.95 1.12  CALCIUM 9.5 9.0   PT/INR No results found for this basename: LABPROT, INR,  in the last 72 hours ABG No results found for this basename: PHART, PCO2, PO2, HCO3,  in the last 72 hours  Studies/Results: Dg Abd 1 View  08/23/2013   CLINICAL DATA:  Feeding tube placement  EXAM: ABDOMEN - 1 VIEW  COMPARISON:  None.  FINDINGS: Single spot fluoroscopic view demonstrates a NG tube with side port below the GE junction.  IMPRESSION: NG tube positioned in stomach.   Electronically Signed   By: Suzy Bouchard M.D.   On: 08/23/2013 14:15   Dg Abd Portable 1v  08/23/2013   CLINICAL DATA:  Status post discharge from the hospital 1 day ago after right hemicolectomy 08/16/2013 for cecal mass.  EXAM: PORTABLE ABDOMEN - 1 VIEW  COMPARISON:  CT abdomen and pelvis 06/28/2013.  FINDINGS: Midline surgical staples are in place.  Surgical clips in the left upper quadrant are identified. Small bowel loops are dilated up to 5.8 cm. A small amount gas is seen the descending colon.  IMPRESSION: Dilatation of small bowel loops of the 5.8 cm could be due to obstruction or ileus.   Electronically Signed   By: Inge Rise M.D.   On: 08/23/2013 10:10   Dg Loyce Dys Tube Plc W/fl-no Rad  08/23/2013   CLINICAL DATA: unable to obtain on the floor, please place NG tube for  gastric decompression for post op ileus (N/V)   NASO G TUBE PLACEMENT WITH FLUORO  Fluoroscopy was utilized by the requesting physician.  No radiographic  interpretation.     Anti-infectives: Anti-infectives   None      Assessment/Plan: POST OP DAY 8 READMITTED WITH ILEUS PICC TNA NGT AMBULATE FOLLOW FILMS OVERALL IN GOOD SPIRITS  LOS: 1 day    Jerral Mccauley A. 08/24/2013

## 2013-08-24 NOTE — Progress Notes (Signed)
PARENTERAL NUTRITION CONSULT NOTE - INITIAL  Pharmacy Consult for TPN Indication: Ileus  Allergies  Allergen Reactions  . Other Other (See Comments)    Adhesive tape, blistered skin   Patient Measurements: Height: 6\' 2"  (188 cm) Weight: 219 lb 12.8 oz (99.7 kg) IBW/kg (Calculated) : 82.2  Vital Signs: Temp: 98.4 F (36.9 C) (07/10 0910) Temp src: Oral (07/10 0910) BP: 121/76 mmHg (07/10 0910) Pulse Rate: 80 (07/10 0910) Intake/Output from previous day: 07/09 0701 - 07/10 0700 In: 1235.4 [I.V.:1235.4] Out: 3000 [Urine:1550; Emesis/NG output:1450] Intake/Output from this shift: Total I/O In: -  Out: 825 [Urine:225; Emesis/NG output:600]  Labs:  Recent Labs  08/23/13 0859 08/24/13 0605  WBC 10.1 7.1  HGB 14.4 12.6*  HCT 41.1 36.2*  PLT 283 260    Recent Labs  08/23/13 0859 08/24/13 0605  NA 133* 136*  K 3.8 3.8  CL 93* 97  CO2 24 23  GLUCOSE 162* 144*  BUN 5* 5*  CREATININE 0.95 1.12  CALCIUM 9.5 9.0  PROT 7.5  --   ALBUMIN 3.9  --   AST 19  --   ALT 36  --   ALKPHOS 71  --   BILITOT 1.0  --    Estimated Creatinine Clearance: 86.3 ml/min (by C-G formula based on Cr of 1.12).   No results found for this basename: GLUCAP,  in the last 72 hours  Medical History: Past Medical History  Diagnosis Date  . Hyperlipidemia   . Arthritis   . GERD (gastroesophageal reflux disease)   . HOH (hard of hearing)   . Gallstones   . Asthma     if have cold  . Pneumonia     hx   Insulin Requirements in the past 24 hours:  Re-admit due to ileus, currently no history of diabetes but slightly elevated serum glucose (144-162)  Current Nutrition:  NPO  Assessment: 63 yo male re-admitted after surgery on 7/2 for open partial colectomy due to findings of a cecal mass.  He was able to tolerate diet and was having stools at the time of discharge.  He now presents with c/o N&V with fever and abdominal distention.  Abd. films reveal some persistent small bowel  dilation with multiple air-fluid levels and we have been asked to start TPN for him.  Nutritional Goals:  2300-2600 kCal, 110-125 grams of protein per day  IV fluid of D5%0.9NS at 181ml/hr currently infusing He has a Peripheral IV in the left arm with plans to place PICC for TPN today.  He has an NG/OG tube in place.  Renal function is stable with creat. of 1.1 and an est. crcl of 58ml/min.  Electrolytes are WNL  GI:  He has a hx. of GERD - IV PPI on board  Plan:  1.  Begin Clinimix 5/15 at 44ml/hr tonight and titrate up based on his tolerance 2.  Will start lipids tomorrow once labs are back 3.  Add SSI coverage when TPN starts 4.  Reduce IV fluids to 31ml/hr when TPN starts 5.  F/U TPN labs and tolerance  Rober Minion, PharmD., MS Clinical Pharmacist Pager:  [phone] Thank you for allowing pharmacy to be part of this patients care team. 08/24/2013,10:55 AM

## 2013-08-25 ENCOUNTER — Inpatient Hospital Stay (HOSPITAL_COMMUNITY): Payer: BC Managed Care – PPO

## 2013-08-25 DIAGNOSIS — E876 Hypokalemia: Secondary | ICD-10-CM

## 2013-08-25 DIAGNOSIS — E46 Unspecified protein-calorie malnutrition: Secondary | ICD-10-CM

## 2013-08-25 LAB — COMPREHENSIVE METABOLIC PANEL WITH GFR
ALT: 51 U/L (ref 0–53)
AST: 39 U/L — ABNORMAL HIGH (ref 0–37)
Albumin: 3.4 g/dL — ABNORMAL LOW (ref 3.5–5.2)
Alkaline Phosphatase: 59 U/L (ref 39–117)
Anion gap: 14 (ref 5–15)
BUN: 10 mg/dL (ref 6–23)
CO2: 26 meq/L (ref 19–32)
Calcium: 9.2 mg/dL (ref 8.4–10.5)
Chloride: 101 meq/L (ref 96–112)
Creatinine, Ser: 1.06 mg/dL (ref 0.50–1.35)
GFR calc Af Amer: 85 mL/min — ABNORMAL LOW
GFR calc non Af Amer: 73 mL/min — ABNORMAL LOW
Glucose, Bld: 124 mg/dL — ABNORMAL HIGH (ref 70–99)
Potassium: 3.4 meq/L — ABNORMAL LOW (ref 3.7–5.3)
Sodium: 141 meq/L (ref 137–147)
Total Bilirubin: 0.6 mg/dL (ref 0.3–1.2)
Total Protein: 6.7 g/dL (ref 6.0–8.3)

## 2013-08-25 LAB — DIFFERENTIAL
Basophils Absolute: 0 10*3/uL (ref 0.0–0.1)
Basophils Relative: 0 % (ref 0–1)
Eosinophils Absolute: 0.1 10*3/uL (ref 0.0–0.7)
Eosinophils Relative: 2 % (ref 0–5)
LYMPHS ABS: 1 10*3/uL (ref 0.7–4.0)
Lymphocytes Relative: 14 % (ref 12–46)
MONOS PCT: 17 % — AB (ref 3–12)
Monocytes Absolute: 1.2 10*3/uL — ABNORMAL HIGH (ref 0.1–1.0)
NEUTROS ABS: 4.7 10*3/uL (ref 1.7–7.7)
NEUTROS PCT: 67 % (ref 43–77)

## 2013-08-25 LAB — GLUCOSE, CAPILLARY
GLUCOSE-CAPILLARY: 140 mg/dL — AB (ref 70–99)
GLUCOSE-CAPILLARY: 142 mg/dL — AB (ref 70–99)
GLUCOSE-CAPILLARY: 184 mg/dL — AB (ref 70–99)
Glucose-Capillary: 130 mg/dL — ABNORMAL HIGH (ref 70–99)
Glucose-Capillary: 131 mg/dL — ABNORMAL HIGH (ref 70–99)
Glucose-Capillary: 140 mg/dL — ABNORMAL HIGH (ref 70–99)
Glucose-Capillary: 147 mg/dL — ABNORMAL HIGH (ref 70–99)

## 2013-08-25 LAB — CBC
HCT: 35.6 % — ABNORMAL LOW (ref 39.0–52.0)
Hemoglobin: 12 g/dL — ABNORMAL LOW (ref 13.0–17.0)
MCH: 32.8 pg (ref 26.0–34.0)
MCHC: 33.7 g/dL (ref 30.0–36.0)
MCV: 97.3 fL (ref 78.0–100.0)
Platelets: 261 10*3/uL (ref 150–400)
RBC: 3.66 MIL/uL — AB (ref 4.22–5.81)
RDW: 13.7 % (ref 11.5–15.5)
WBC: 7.1 10*3/uL (ref 4.0–10.5)

## 2013-08-25 LAB — MAGNESIUM: Magnesium: 2.3 mg/dL (ref 1.5–2.5)

## 2013-08-25 LAB — PREALBUMIN: Prealbumin: 19.3 mg/dL (ref 17.0–34.0)

## 2013-08-25 LAB — TRIGLYCERIDES: Triglycerides: 141 mg/dL (ref <150)

## 2013-08-25 LAB — PHOSPHORUS: Phosphorus: 3.9 mg/dL (ref 2.3–4.6)

## 2013-08-25 MED ORDER — FAT EMULSION 20 % IV EMUL
250.0000 mL | INTRAVENOUS | Status: AC
  Administered 2013-08-25: 250 mL via INTRAVENOUS
  Filled 2013-08-25: qty 250

## 2013-08-25 MED ORDER — DEXTROSE-NACL 5-0.9 % IV SOLN
INTRAVENOUS | Status: DC
  Administered 2013-08-25 – 2013-09-02 (×7): via INTRAVENOUS

## 2013-08-25 MED ORDER — TRACE MINERALS CR-CU-F-FE-I-MN-MO-SE-ZN IV SOLN
INTRAVENOUS | Status: AC
  Administered 2013-08-25: 17:00:00 via INTRAVENOUS
  Filled 2013-08-25: qty 2000

## 2013-08-25 NOTE — Progress Notes (Signed)
PARENTERAL NUTRITION CONSULT NOTE - FOLLOW UP  Pharmacy Consult for TPN Indication: Prolonged ileus  Allergies  Allergen Reactions  . Other Other (See Comments)    Adhesive tape, blistered skin   Patient Measurements: Height: 6\' 2"  (188 cm) Weight: 219 lb 12.8 oz (99.7 kg) IBW/kg (Calculated) : 82.2 Adjusted Body Weight: 87.5kg Usual Weight: 104 kg  Vital Signs: Temp: 97.5 F (36.4 C) (07/11 0625) Temp src: Oral (07/11 0625) BP: 130/61 mmHg (07/11 0625) Pulse Rate: 77 (07/11 0625) Intake/Output from previous day: 07/10 0701 - 07/11 0700 In: 1223.3 [I.V.:746; TPN:477.3] Out: 4175 [Urine:625; Emesis/NG output:3550] Labs:  Recent Labs  08/23/13 0859 08/24/13 0605 08/25/13 0452  WBC 10.1 7.1 7.1  HGB 14.4 12.6* 12.0*  HCT 41.1 36.2* 35.6*  PLT 283 260 261    Recent Labs  08/23/13 0859 08/24/13 0605 08/25/13 0452  NA 133* 136* 141  K 3.8 3.8 3.4*  CL 93* 97 101  CO2 24 23 26   GLUCOSE 162* 144* 124*  BUN 5* 5* 10  CREATININE 0.95 1.12 1.06  CALCIUM 9.5 9.0 9.2  MG  --   --  2.3  PHOS  --   --  3.9  PROT 7.5  --  6.7  ALBUMIN 3.9  --  3.4*  AST 19  --  39*  ALT 36  --  51  ALKPHOS 71  --  59  BILITOT 1.0  --  0.6  TRIG  --   --  141   Estimated Creatinine Clearance: 91.2 ml/min (by C-G formula based on Cr of 1.06).   Recent Labs  08/25/13 0018 08/25/13 0419 08/25/13 0732  GLUCAP 142* 130* 184*   Insulin Requirements in the past 24 hours:  CBG of 124 after TPN start with 2 units of SSI given, last CBG = 184  Current Nutrition:  TPN at 48ml/hr.  Baseline Data:  Albumin 3.9, Total Protein 7.5 Triglycerides 141, Prealbumin is pending (7/9)  Assessment: 63 yo male re-admitted after surgery on 7/2 for open partial colectomy due to findings of a cecal mass. He was able to tolerate diet and was having stools at the time of discharge. He now presents with c/o N&V with fever and abdominal distention. Abd. films reveal some persistent small bowel dilation  with multiple air-fluid levels and we have been asked to start TPN for him.  Nutritional Goals:  2300-2600 kCal, 110-125 grams of protein per day Estimated Nutritional Needs: (RD) Kcal: 2200 - 2500 Protein: 118 - 130 grams Fluid: >2.2 liters daily  IVF:  D5%0.9NS at 29ml/hr  ID:  No s/s infection - currently without need for ABX therapy  GI:  Still with significant emesis/NG output (1.45L yesterday and 3.55L today thus far).  He did have one stool documented as well.  Renal:  UOP of 1.5L yesterday, currently at 0.72ml/kg/hr.  Creatinine is stable and he has an estimated crcl of 90 ml/min  Lytes:  Tolerated initial TPN start - mild hypokalemia this morning.  Was somewhat concerned for re-feeding syndrome since he has not eaten well in the past couple of weeks.  Pulm:  Remains on room air  Heme:  Some anemia present but H/H and platelets appear stable  Plan:  1.  Titrate Clinimix E 5/15 to 109ml/hr today with plans to go to 143ml/hr tomorrow 2.  Add Lipids 20% at 81ml/hr 3.  Decrease IVF to 40 ml/hr when new bag is hung tonight. 4.  F/U CBG's and adjust SSI if required. 5.  Supplement potassium as needed  Rober Minion, PharmD., MS Clinical Pharmacist Pager:  [phone] Thank you for allowing pharmacy to be part of this patients care team. 08/25/2013,8:02 AM

## 2013-08-25 NOTE — Progress Notes (Signed)
I have seen and examined the patient and agree with the assessment and plans.  Mckinzee Spirito A. Rondrick Barreira  MD, FACS  

## 2013-08-25 NOTE — Progress Notes (Signed)
Patient ID: Zachary Kim, male   DOB: Jul 11, 1950, 63 y.o.   MRN: [id number]   Subjective: Had a small solid BM, high NGT output still, walking in hallways, hungry wants to eat.    Objective:  Vital signs:  Filed Vitals:   08/24/13 0910 08/24/13 1315 08/24/13 2112 08/25/13 0625  BP: 121/76 126/65 139/72 130/61  Pulse: 80 67 75 77  Temp: 98.4 F (36.9 C) 98.3 F (36.8 C) 99.7 F (37.6 C) 97.5 F (36.4 C)  TempSrc: Oral Axillary Oral Oral  Resp: _0 Height:      Weight:      SpO2: 98% 99% 100% 100%    Last BM Date: 08/22/13 (small per pt)  Intake/Output   Yesterday:  07/10 0701 - 07/11 0700 In: 1223.3 [I.V.:746; TPN:477.3] Out: 4175 [Urine:625; Emesis/NG output:3550] This shift:    I/O last 3 completed shifts: In: 2458.8 [I.V.:1981.4] Out: 0160 [Urine:1775; Emesis/NG output:5000]     Physical Exam: General: Pt awake/alert/oriented x4 in no  acute distress Chest: cta.  No chest wall pain w good excursion CV:  Pulses intact.  Regular rhythm MS: Normal AROM mjr joints.  No obvious deformity Abdomen: Soft.  distended. Non tender.  Midline incision and lap sites with approximated wound edges, staples in place.  No evidence of peritonitis.  No incarcerated hernias. Ext:  SCDs BLE.  No mjr edema.  No cyanosis Skin: No petechiae / purpura   Problem List:   Active Problems:   Postoperative ileus   Ileus, postoperative    Results:   Labs: Results for orders placed during the hospital encounter of 08/23/13 (from the past 48 hour(s))  URINALYSIS, ROUTINE W REFLEX MICROSCOPIC     Status: None   Collection Time    08/23/13  5:20 PM      Result Value Ref Range   Color, Urine YELLOW  YELLOW   APPearance CLEAR  CLEAR   Specific Gravity, Urine 1.016  1.005 - 1.030   pH 6.5  5.0 - 8.0   Glucose, UA NEGATIVE  NEGATIVE mg/dL   Hgb urine dipstick NEGATIVE  NEGATIVE   Bilirubin Urine NEGATIVE  NEGATIVE   Ketones, ur NEGATIVE  NEGATIVE mg/dL   Protein, ur  NEGATIVE  NEGATIVE mg/dL   Urobilinogen, UA 0.2  0.0 - 1.0 mg/dL   Nitrite NEGATIVE  NEGATIVE   Leukocytes, UA NEGATIVE  NEGATIVE   Comment: MICROSCOPIC NOT DONE ON URINES WITH NEGATIVE PROTEIN, BLOOD, LEUKOCYTES, NITRITE, OR GLUCOSE <1000 mg/dL.  BASIC METABOLIC PANEL     Status: Abnormal   Collection Time    08/24/13  6:05 AM      Result Value Ref Range   Sodium 136 (*) 137 - 147 mEq/L   Potassium 3.8  3.7 - 5.3 mEq/L   Chloride 97  96 - 112 mEq/L   CO2 23  19 - 32 mEq/L   Glucose, Bld 144 (*) 70 - 99 mg/dL   BUN 5 (*) 6 - 23 mg/dL   Creatinine, Ser 1.12  0.50 - 1.35 mg/dL   Calcium 9.0  8.4 - 10.5 mg/dL   GFR calc non Af Amer 69 (*) >90 mL/min   GFR calc Af Amer 80 (*) >90 mL/min   Comment: (NOTE)     The eGFR has been calculated using the CKD EPI equation.     This calculation has not been validated in all clinical situations.     eGFR's persistently <90 mL/min signify possible Chronic Kidney  Disease.   Anion gap 16 (*) 5 - 15  CBC     Status: Abnormal   Collection Time    08/24/13  6:05 AM      Result Value Ref Range   WBC 7.1  4.0 - 10.5 K/uL   RBC 3.73 (*) 4.22 - 5.81 MIL/uL   Hemoglobin 12.6 (*) 13.0 - 17.0 g/dL   HCT 36.2 (*) 39.0 - 52.0 %   MCV 97.1  78.0 - 100.0 fL   MCH 33.8  26.0 - 34.0 pg   MCHC 34.8  30.0 - 36.0 g/dL   RDW 13.8  11.5 - 15.5 %   Platelets 260  150 - 400 K/uL  GLUCOSE, CAPILLARY     Status: Abnormal   Collection Time    08/24/13 12:32 PM      Result Value Ref Range   Glucose-Capillary 130 (*) 70 - 99 mg/dL  GLUCOSE, CAPILLARY     Status: Abnormal   Collection Time    08/24/13  4:48 PM      Result Value Ref Range   Glucose-Capillary 113 (*) 70 - 99 mg/dL  GLUCOSE, CAPILLARY     Status: Abnormal   Collection Time    08/24/13  8:12 PM      Result Value Ref Range   Glucose-Capillary 120 (*) 70 - 99 mg/dL   Comment 1 Notify RN    GLUCOSE, CAPILLARY     Status: Abnormal   Collection Time    08/25/13 12:18 AM      Result Value Ref  Range   Glucose-Capillary 142 (*) 70 - 99 mg/dL  GLUCOSE, CAPILLARY     Status: Abnormal   Collection Time    08/25/13  4:19 AM      Result Value Ref Range   Glucose-Capillary 130 (*) 70 - 99 mg/dL   Comment 1 Notify RN    COMPREHENSIVE METABOLIC PANEL     Status: Abnormal   Collection Time    08/25/13  4:52 AM      Result Value Ref Range   Sodium 141  137 - 147 mEq/L   Potassium 3.4 (*) 3.7 - 5.3 mEq/L   Chloride 101  96 - 112 mEq/L   CO2 26  19 - 32 mEq/L   Glucose, Bld 124 (*) 70 - 99 mg/dL   BUN 10  6 - 23 mg/dL   Creatinine, Ser 1.06  0.50 - 1.35 mg/dL   Calcium 9.2  8.4 - 10.5 mg/dL   Total Protein 6.7  6.0 - 8.3 g/dL   Albumin 3.4 (*) 3.5 - 5.2 g/dL   AST 39 (*) 0 - 37 U/L   ALT 51  0 - 53 U/L   Alkaline Phosphatase 59  39 - 117 U/L   Total Bilirubin 0.6  0.3 - 1.2 mg/dL   GFR calc non Af Amer 73 (*) >90 mL/min   GFR calc Af Amer 85 (*) >90 mL/min   Comment: (NOTE)     The eGFR has been calculated using the CKD EPI equation.     This calculation has not been validated in all clinical situations.     eGFR's persistently <90 mL/min signify possible Chronic Kidney     Disease.   Anion gap 14  5 - 15  MAGNESIUM     Status: None   Collection Time    08/25/13  4:52 AM      Result Value Ref Range   Magnesium 2.3  1.5 - 2.5  mg/dL  PHOSPHORUS     Status: None   Collection Time    08/25/13  4:52 AM      Result Value Ref Range   Phosphorus 3.9  2.3 - 4.6 mg/dL  TRIGLYCERIDES     Status: None   Collection Time    08/25/13  4:52 AM      Result Value Ref Range   Triglycerides 141  <150 mg/dL  CBC     Status: Abnormal   Collection Time    08/25/13  4:52 AM      Result Value Ref Range   WBC 7.1  4.0 - 10.5 K/uL   RBC 3.66 (*) 4.22 - 5.81 MIL/uL   Hemoglobin 12.0 (*) 13.0 - 17.0 g/dL   HCT 35.6 (*) 39.0 - 52.0 %   MCV 97.3  78.0 - 100.0 fL   MCH 32.8  26.0 - 34.0 pg   MCHC 33.7  30.0 - 36.0 g/dL   RDW 13.7  11.5 - 15.5 %   Platelets 261  150 - 400 K/uL   DIFFERENTIAL     Status: Abnormal   Collection Time    08/25/13  4:52 AM      Result Value Ref Range   Neutrophils Relative % 67  43 - 77 %   Neutro Abs 4.7  1.7 - 7.7 K/uL   Lymphocytes Relative 14  12 - 46 %   Lymphs Abs 1.0  0.7 - 4.0 K/uL   Monocytes Relative 17 (*) 3 - 12 %   Monocytes Absolute 1.2 (*) 0.1 - 1.0 K/uL   Eosinophils Relative 2  0 - 5 %   Eosinophils Absolute 0.1  0.0 - 0.7 K/uL   Basophils Relative 0  0 - 1 %   Basophils Absolute 0.0  0.0 - 0.1 K/uL  GLUCOSE, CAPILLARY     Status: Abnormal   Collection Time    08/25/13  7:32 AM      Result Value Ref Range   Glucose-Capillary 184 (*) 70 - 99 mg/dL    Imaging / Studies: Dg Abd 1 View  08/25/2013   CLINICAL DATA:  Abdominal pain  EXAM: ABDOMEN - 1 VIEW  COMPARISON:  08/24/2013  FINDINGS: Postsurgical changes are again seen. A nasogastric catheter is noted within the stomach. Diffuse small bowel dilatation is noted. No free air is seen. No acute bony abnormality is noted.  IMPRESSION: Stable small bowel dilatation.  Continued followup is recommended.   Electronically Signed   By: Inez Catalina M.D.   On: 08/25/2013 08:24   Dg Abd 1 View  08/23/2013   CLINICAL DATA:  Feeding tube placement  EXAM: ABDOMEN - 1 VIEW  COMPARISON:  None.  FINDINGS: Single spot fluoroscopic view demonstrates a NG tube with side port below the GE junction.  IMPRESSION: NG tube positioned in stomach.   Electronically Signed   By: Suzy Bouchard M.D.   On: 08/23/2013 14:15   Dg Abd 2 Views  08/24/2013   CLINICAL DATA:  Abdominal pain. Abdominal distention. Small bowel dilation.  EXAM: ABDOMEN - 2 VIEW  COMPARISON:  08/23/2013.  FINDINGS: Unchanged bowel gas pattern. Dilated loops of small bowel are present, measuring up to 67 mm. A multiple air-fluid levels. Postsurgical changes are present in the abdomen with surgical clips. Midline laparotomy staples.  Enteric tube is present with the tip in the distal gastric fundus.  IMPRESSION: Persistent  small bowel dilation with multiple air-fluid levels. In the postoperative setting, this likely represents  ileus rather than and residual/recurrent obstruction.   Electronically Signed   By: Dereck Ligas M.D.   On: 08/24/2013 10:01   Dg Addison Bailey G Tube Plc W/fl-no Rad  08/23/2013   CLINICAL DATA: unable to obtain on the floor, please place NG tube for  gastric decompression for post op ileus (N/V)   NASO G TUBE PLACEMENT WITH FLUORO  Fluoroscopy was utilized by the requesting physician.  No radiographic  interpretation.     Scheduled Meds: . antiseptic oral rinse  15 mL Mouth Rinse q12n4p  . chlorhexidine  15 mL Mouth Rinse BID  . enoxaparin (LOVENOX) injection  40 mg Subcutaneous Q24H  . insulin aspart  0-9 Units Subcutaneous 6 times per day  . metoCLOPramide (REGLAN) injection  10 mg Intravenous 4 times per day  . pantoprazole (PROTONIX) IV  40 mg Intravenous QHS   Continuous Infusions: . dextrose 5 % and 0.9% NaCl    . Marland KitchenTPN (CLINIMIX-E) Adult     And  . fat emulsion    . Marland KitchenTPN (CLINIMIX-E) Adult 40 mL/hr at 08/24/13 1830   PRN Meds:.morphine injection, ondansetron, sodium chloride   Antibiotics: Anti-infectives   None      Assessment/Plan POD#9 laparoscopic converted to open partial colectomy and appendectomy for a cecal mass(tubulovillous adenoma)  Post operative ileus PCM hypokalemia -continue with bowel rest, NGT decompression(3596m NGT output 24h) -TPN per pharmacy -mobilize -pain control -SCD/lovenox -supplement K  EErby Pian AGrandview Medical CenterSurgery Pager [phone] 08/25/2013 10:28 AM

## 2013-08-25 NOTE — ED Provider Notes (Addendum)
Medical screening examination/treatment/procedure(s) were conducted as a shared visit with non-physician practitioner(s) and myself.  I personally evaluated the patient during the encounter.  The patient was uncomfortable appearing. Given his recent surgery, his description of fever, increasing abdominal pain, ileus was a consideration, as was bowel obstruction. Patient required admission for further evaluation and management.   EKG Interpretation   Date/Time:  Thursday August 23 2013 09:51:28 EDT Ventricular Rate:  87 PR Interval:  154 QRS Duration: 80 QT Interval:  350 QTC Calculation: 421 R Axis:   16 Text Interpretation:  Sinus rhythm Sinus rhythm Normal ECG Confirmed by  Carmin Muskrat  MD ([id number]) on 08/23/2013 11:29:17 AM        Carmin Muskrat, MD 08/25/13 [place], MD 09/06/13 [phone]

## 2013-08-26 LAB — GLUCOSE, CAPILLARY
Glucose-Capillary: 157 mg/dL — ABNORMAL HIGH (ref 70–99)
Glucose-Capillary: 138 mg/dL — ABNORMAL HIGH (ref 70–99)
Glucose-Capillary: 125 mg/dL — ABNORMAL HIGH (ref 70–99)
Glucose-Capillary: 119 mg/dL — ABNORMAL HIGH (ref 70–99)

## 2013-08-26 MED ORDER — TRACE MINERALS CR-CU-F-FE-I-MN-MO-SE-ZN IV SOLN
INTRAVENOUS | Status: AC
  Administered 2013-08-26: 17:00:00 via INTRAVENOUS
  Filled 2013-08-26: qty 3000

## 2013-08-26 MED ORDER — FAT EMULSION 20 % IV EMUL
250.0000 mL | INTRAVENOUS | Status: AC
  Administered 2013-08-26: 250 mL via INTRAVENOUS
  Filled 2013-08-26: qty 250

## 2013-08-26 MED ORDER — POTASSIUM CHLORIDE 10 MEQ/50ML IV SOLN
10.0000 meq | INTRAVENOUS | Status: AC
  Administered 2013-08-26 (×3): 10 meq via INTRAVENOUS
  Filled 2013-08-26 (×3): qty 50

## 2013-08-26 MED ORDER — ACETAMINOPHEN 325 MG PO TABS
650.0000 mg | ORAL_TABLET | Freq: Four times a day (QID) | ORAL | Status: DC | PRN
  Administered 2013-08-26 – 2013-09-01 (×13): 650 mg via ORAL
  Filled 2013-08-26 (×13): qty 2

## 2013-08-26 MED ORDER — INSULIN ASPART 100 UNIT/ML ~~LOC~~ SOLN
0.0000 [IU] | Freq: Three times a day (TID) | SUBCUTANEOUS | Status: DC
  Administered 2013-08-26 – 2013-08-28 (×5): 1 [IU] via SUBCUTANEOUS

## 2013-08-26 NOTE — Progress Notes (Addendum)
PARENTERAL NUTRITION CONSULT NOTE - FOLLOW UP  Pharmacy Consult for TPN Indication: Prolonged ileus  Allergies  Allergen Reactions  . Other Other (See Comments)    Adhesive tape, blistered skin   Patient Measurements: Height: 6' 2" (188 cm) Weight: 219 lb 12.8 oz (99.7 kg) IBW/kg (Calculated) : 82.2 Adjusted Body Weight: 87.5kg Usual Weight: 104 kg  Vital Signs: Temp: 98.8 F (37.1 C) (07/12 0455) Temp src: Oral (07/12 0455) BP: 141/79 mmHg (07/12 0455) Pulse Rate: 85 (07/12 0455) Intake/Output from previous day: 07/11 0701 - 07/12 0700 In: 2083 [P.O.:1320; I.V.:400; NG/GT:50; TPN:313] Out: 2450 [Urine:350; Emesis/NG output:2100] Labs:  Recent Labs  08/24/13 0605 08/25/13 0452  WBC 7.1 7.1  HGB 12.6* 12.0*  HCT 36.2* 35.6*  PLT 260 261    Recent Labs  08/24/13 0605 08/25/13 0452  NA 136* 141  K 3.8 3.4*  CL 97 101  CO2 23 26  GLUCOSE 144* 124*  BUN 5* 10  CREATININE 1.12 1.06  CALCIUM 9.0 9.2  MG  --  2.3  PHOS  --  3.9  PROT  --  6.7  ALBUMIN  --  3.4*  AST  --  39*  ALT  --  51  ALKPHOS  --  59  BILITOT  --  0.6  PREALBUMIN  --  19.3  TRIG  --  141   Estimated Creatinine Clearance: 91.2 ml/min (by C-G formula based on Cr of 1.06).   Recent Labs  08/25/13 2355 08/26/13 0347 08/26/13 0758  GLUCAP 147* 157* 138*   Insulin Requirements in the past 24 hours:  CBG < 150 after TPN start with 5 units of SSI given.  Pt. complaining of frequency of sticks - will decrease to every 8 hours since he is fairly controlled.  Current Nutrition:  TPN at 20m/hr.  Baseline Data:  Albumin 3.9, Total Protein 7.5 Triglycerides 141, Prealbumin is 19.3 which is the lower end of normal  Assessment: 63yo male re-admitted after surgery on 7/2 for open partial colectomy due to findings of a cecal mass. He was able to tolerate diet and was having stools at the time of discharge. He now presents with c/o N&V with fever and abdominal distention. Abd. films reveal  some persistent small bowel dilation with multiple air-fluid levels and we have been asked to start TPN for him.  Nutritional Goals:  2300-2600 kCal, 110-125 grams of protein per day Estimated Nutritional Needs: (RD) Kcal: 2200 - 2500 Protein: 118 - 130 grams Fluid: >2.2 liters daily  Endo:  No history of DM - CBG's mostly < 150  IVF:  D5%0.9NS at 439mhr  ID:  No s/s infection - currently without need for ABX therapy  GI:  Still with significant emesis/NG output (210078m  He is having stools  Renal:  Creatinine is stable and he has an estimated crcl of 90 ml/min  Lytes:  Tolerated initial TPN start - mild hypokalemia this morning.  Was somewhat concerned for re-feeding syndrome since he has not eaten well in the past couple of weeks.  Hepatic:  AST/ALT, Lipase and alk phos is WNL  Pulm:  Remains on room air  Heme:  Some anemia present but H/H and platelets appear stable  Plan:  1.  Titrate Clinimix E 5/15 to 110m63m  2.  Add Lipids 20% at 10ml14m3.  Decrease IVF to KVO  [facility] F/U CBG's and adjust SSI if required, add 10 units to TPN 5.  Supplement potassium with 10 meq every  1 hr. X 3  Rober Minion, PharmD., MS Clinical Pharmacist Pager:  [phone] Thank you for allowing pharmacy to be part of this patients care team. 08/26/2013,9:01 AM

## 2013-08-26 NOTE — Progress Notes (Signed)
  Subjective: Passed liquid BM and gas, feels a bit less distended, occasional cramping  Objective: Vital signs in last 24 hours: Temp:  [98.2 F (36.8 C)-98.8 F (37.1 C)] 98.8 F (37.1 C) (07/12 0455) Pulse Rate:  [73-85] 85 (07/12 0455) Resp:  [16-20] 16 (07/12 0455) BP: (137-153)/(68-79) 141/79 mmHg (07/12 0455) SpO2:  [95 %-99 %] 95 % (07/12 0455) Last BM Date: 08/26/13  Intake/Output from previous day: 07/11 0701 - 07/12 0700 In: 2083 [P.O.:1320; I.V.:400; NG/GT:50; TPN:313] Out: 2450 [Urine:350; Emesis/NG output:2100] Intake/Output this shift: Total I/O In: 0  Out: 400 [Urine:400]  General appearance: alert and cooperative Resp: clear to auscultation bilaterally Cardio: regular rate and rhythm GI: distended but soft, expected incisional tenderness, some BS, incision OK  Lab Results:   Recent Labs  08/24/13 0605 08/25/13 0452  WBC 7.1 7.1  HGB 12.6* 12.0*  HCT 36.2* 35.6*  PLT 260 261   BMET  Recent Labs  08/24/13 0605 08/25/13 0452  NA 136* 141  K 3.8 3.4*  CL 97 101  CO2 23 26  GLUCOSE 144* 124*  BUN 5* 10  CREATININE 1.12 1.06  CALCIUM 9.0 9.2   PT/INR No results found for this basename: LABPROT, INR,  in the last 72 hours ABG No results found for this basename: PHART, PCO2, PO2, HCO3,  in the last 72 hours  Studies/Results: Dg Abd 1 View  08/25/2013   CLINICAL DATA:  Abdominal pain  EXAM: ABDOMEN - 1 VIEW  COMPARISON:  08/24/2013  FINDINGS: Postsurgical changes are again seen. A nasogastric catheter is noted within the stomach. Diffuse small bowel dilatation is noted. No free air is seen. No acute bony abnormality is noted.  IMPRESSION: Stable small bowel dilatation.  Continued followup is recommended.   Electronically Signed   By: Inez Catalina M.D.   On: 08/25/2013 08:24    Anti-infectives: Anti-infectives   None      Assessment/Plan: POD#10 laparoscopic converted to open partial colectomy and appendectomy for a cecal  mass(tubulovillous adenoma)  Post operative ileus - improving, try clears PCM - TNA FEN - check labs in AM VTE - Lovenox  LOS: 3 days    Chuckie Mccathern E 08/26/2013

## 2013-08-26 NOTE — Progress Notes (Signed)
Pt has been up walking multiple times today and has had 5 loose BM's.  No c/o nausea or vomiting.  Abd is tight and distended still but has active bowel sounds and is softer than on Sat am.  Pt enjoying his clear liquids.

## 2013-08-27 ENCOUNTER — Inpatient Hospital Stay (HOSPITAL_COMMUNITY): Payer: BC Managed Care – PPO

## 2013-08-27 ENCOUNTER — Encounter (INDEPENDENT_AMBULATORY_CARE_PROVIDER_SITE_OTHER): Payer: BC Managed Care – PPO | Admitting: Surgery

## 2013-08-27 LAB — COMPREHENSIVE METABOLIC PANEL
ALT: 46 U/L (ref 0–53)
AST: 27 U/L (ref 0–37)
Albumin: 3.2 g/dL — ABNORMAL LOW (ref 3.5–5.2)
Alkaline Phosphatase: 74 U/L (ref 39–117)
Anion gap: 15 (ref 5–15)
BILIRUBIN TOTAL: 0.6 mg/dL (ref 0.3–1.2)
BUN: 12 mg/dL (ref 6–23)
CHLORIDE: 97 meq/L (ref 96–112)
CO2: 21 meq/L (ref 19–32)
Calcium: 8.5 mg/dL (ref 8.4–10.5)
Creatinine, Ser: 0.8 mg/dL (ref 0.50–1.35)
Glucose, Bld: 133 mg/dL — ABNORMAL HIGH (ref 70–99)
Potassium: 3.8 mEq/L (ref 3.7–5.3)
SODIUM: 133 meq/L — AB (ref 137–147)
Total Protein: 6.4 g/dL (ref 6.0–8.3)

## 2013-08-27 LAB — CBC
HEMATOCRIT: 34.1 % — AB (ref 39.0–52.0)
HEMOGLOBIN: 11.8 g/dL — AB (ref 13.0–17.0)
MCH: 33 pg (ref 26.0–34.0)
MCHC: 34.6 g/dL (ref 30.0–36.0)
MCV: 95.3 fL (ref 78.0–100.0)
Platelets: 251 10*3/uL (ref 150–400)
RBC: 3.58 MIL/uL — ABNORMAL LOW (ref 4.22–5.81)
RDW: 13.3 % (ref 11.5–15.5)
WBC: 11.1 10*3/uL — ABNORMAL HIGH (ref 4.0–10.5)

## 2013-08-27 LAB — GLUCOSE, CAPILLARY
GLUCOSE-CAPILLARY: 135 mg/dL — AB (ref 70–99)
Glucose-Capillary: 134 mg/dL — ABNORMAL HIGH (ref 70–99)
Glucose-Capillary: 138 mg/dL — ABNORMAL HIGH (ref 70–99)

## 2013-08-27 LAB — DIFFERENTIAL
BASOS ABS: 0.1 10*3/uL (ref 0.0–0.1)
BASOS PCT: 1 % (ref 0–1)
EOS ABS: 0.2 10*3/uL (ref 0.0–0.7)
EOS PCT: 1 % (ref 0–5)
Lymphocytes Relative: 10 % — ABNORMAL LOW (ref 12–46)
Lymphs Abs: 1.1 10*3/uL (ref 0.7–4.0)
MONO ABS: 1.1 10*3/uL — AB (ref 0.1–1.0)
MONOS PCT: 10 % (ref 3–12)
Neutro Abs: 8.6 10*3/uL — ABNORMAL HIGH (ref 1.7–7.7)
Neutrophils Relative %: 78 % — ABNORMAL HIGH (ref 43–77)

## 2013-08-27 LAB — PREALBUMIN: Prealbumin: 19.6 mg/dL (ref 17.0–34.0)

## 2013-08-27 LAB — MAGNESIUM: Magnesium: 2.1 mg/dL (ref 1.5–2.5)

## 2013-08-27 LAB — PHOSPHORUS: Phosphorus: 3.5 mg/dL (ref 2.3–4.6)

## 2013-08-27 LAB — TRIGLYCERIDES: Triglycerides: 165 mg/dL — ABNORMAL HIGH (ref <150)

## 2013-08-27 MED ORDER — IOHEXOL 300 MG/ML  SOLN
100.0000 mL | Freq: Once | INTRAMUSCULAR | Status: AC | PRN
  Administered 2013-08-27: 100 mL via INTRAVENOUS

## 2013-08-27 MED ORDER — FAT EMULSION 20 % IV EMUL
250.0000 mL | INTRAVENOUS | Status: AC
  Administered 2013-08-27: 250 mL via INTRAVENOUS
  Filled 2013-08-27: qty 250

## 2013-08-27 MED ORDER — CALCIUM CARBONATE ANTACID 500 MG PO CHEW
1.0000 | CHEWABLE_TABLET | Freq: Two times a day (BID) | ORAL | Status: DC
Start: 2013-08-27 — End: 2013-09-03
  Administered 2013-08-27 – 2013-09-02 (×13): 200 mg via ORAL
  Filled 2013-08-27 (×17): qty 1

## 2013-08-27 MED ORDER — TRACE MINERALS CR-CU-F-FE-I-MN-MO-SE-ZN IV SOLN
INTRAVENOUS | Status: AC
  Administered 2013-08-27: 18:00:00 via INTRAVENOUS
  Filled 2013-08-27: qty 3000

## 2013-08-27 NOTE — Progress Notes (Signed)
PARENTERAL NUTRITION CONSULT NOTE - FOLLOW UP  Pharmacy Consult for TPN Indication: Prolonged ileus  Allergies  Allergen Reactions  . Other Other (See Comments)    Adhesive tape, blistered skin   Patient Measurements: Height: 6\' 2"  (188 cm) Weight: 219 lb 12.8 oz (99.7 kg) IBW/kg (Calculated) : 82.2 Adjusted Body Weight: 87.5kg Usual Weight: 104 kg  Vital Signs: Temp: 97.9 F (36.6 C) (07/13 0600) Temp src: Oral (07/13 0600) BP: 134/66 mmHg (07/13 0600) Pulse Rate: 75 (07/13 0600) Intake/Output from previous day: 07/12 0701 - 07/13 0700 In: 4029.2 [P.O.:618; I.V.:943.2; TFT:7322] Out: 402 [Urine:400; Stool:2] Labs:  Recent Labs  08/25/13 0452 08/27/13 0500  WBC 7.1 11.1*  HGB 12.0* 11.8*  HCT 35.6* 34.1*  PLT 261 251    Recent Labs  08/25/13 0452 08/27/13 0500  NA 141 133*  K 3.4* 3.8  CL 101 97  CO2 26 21  GLUCOSE 124* 133*  BUN 10 12  CREATININE 1.06 0.80  CALCIUM 9.2 8.5  MG 2.3 2.1  PHOS 3.9 3.5  PROT 6.7 6.4  ALBUMIN 3.4* 3.2*  AST 39* 27  ALT 51 46  ALKPHOS 59 74  BILITOT 0.6 0.6  PREALBUMIN 19.3  --   TRIG 141 165*   Estimated Creatinine Clearance: 120.8 ml/min (by C-G formula based on Cr of 0.8).   Recent Labs  08/26/13 1214 08/26/13 2118 08/27/13 0559  GLUCAP 125* 119* 135*   Insulin Requirements in the past 24 hours:  CBGs remain < 150 - only required 2 units SSI; 10 units regular insulin in TPN.  Current Nutrition:  Clinimix E 5/15 at 110 ml/hr and 20% lipids at 44ml/hr provides 132 gm protein, 2374 kcal.  Nutritional Goals:  2200-2500 kCal, 118-130 grams of protein per day (per RD note 7/10)  Assessment: 63 yo male re-admitted after surgery on 7/2 for open partial colectomy due to findings of a cecal mass. He was able to tolerate diet and was having stools at the time of discharge. He now presents with c/o N&V with fever and abdominal distention. Abd films reveal some persistent small bowel dilation with multiple air-fluid  levels so TPN started.  GI: NGT fell out. Pt now with nausea again. Multiple BMs. On clear liquid diet. IV reglan.  Endo:  No history of DM - CBG's remain well controlled on goal TPN rate. Will d/c SSI and CBGs tomorrow if still tolerating.  ID:  No s/s infection - currently without need for ABX therapy  GI:  Still with significant emesis/NG output (2121ml).  He is having stools  Renal: SCr stable. UOP not being documented accurately. MIVF at [facility].  Lytes: Na down to 133. Other lytes ok.   Hepatic:  LFTs wnl. TG up to 165.  Pulm: RA  Heme:  H/H, plt stable  Plan:  1. Continue Clinimix E 5/15 at 121ml/hr and 20% lipids at 10 ml/hr 2. Will continue regular insulin 10 units in TPN.   3. F/u prealbumin 4. Will f/u po intake and ability to advance diet and being weaning TPN.  Sherlon Handing, PharmD, BCPS Clinical pharmacist, pager [phone] 08/27/2013,10:00 AM

## 2013-08-27 NOTE — Clinical Documentation Improvement (Signed)
To MD's, NP's, and PA's  "Severe malnutrition in the context of acute illness or injury " per nutrition consult note, please document if this is or either diagnosis below is an appropriate secondary condition.  Thank you   Possible Clinical Conditions?  Severe Malnutrition    Protein Calorie Malnutrition  Severe Protein Calorie Malnutrition  Other Condition  Cannot clinically determine   Risk Factgors: s/p right Colectomy, Post op ileus  Signs & Symptoms: "acute illness as evidenced by 5% wt loss x < 1 month and intake of <50% x at least 5 days"  Treatment: Patient is receiving TPN with Clinimix E 5/15 @ 40 ml/hr.    Thank You, Ree Kida ,RN Clinical Documentation Specialist:  [facility] [employer]

## 2013-08-27 NOTE — Progress Notes (Signed)
  Subjective: NGT fell out. Felt good at first but rough night.  Nausea back.  No vomiting.  Felt feverish. Multiple BM  Objective: Vital signs in last 24 hours: Temp:  [97.9 F (36.6 C)-101.1 F (38.4 C)] 97.9 F (36.6 C) (07/13 0600) Pulse Rate:  [70-82] 75 (07/13 0600) Resp:  [16-20] 20 (07/13 0600) BP: (134-156)/(62-78) 134/66 mmHg (07/13 0600) SpO2:  [100 %] 100 % (07/13 0600) Last BM Date: 08/26/13  Intake/Output from previous day: 07/12 0701 - 07/13 0700 In: 4029.2 [P.O.:618; I.V.:943.2; FEO:7121] Out: 402 [Urine:400; Stool:2] Intake/Output this shift:    Incision/Wound:INCISION INTACT.  STILL DISTENDED BUT SOFTER.  NO PERITONITIS.  SORE  Lab Results:   Recent Labs  08/25/13 0452 08/27/13 0500  WBC 7.1 11.1*  HGB 12.0* 11.8*  HCT 35.6* 34.1*  PLT 261 251   BMET  Recent Labs  08/25/13 0452 08/27/13 0500  NA 141 133*  K 3.4* 3.8  CL 101 97  CO2 26 21  GLUCOSE 124* 133*  BUN 10 12  CREATININE 1.06 0.80  CALCIUM 9.2 8.5   PT/INR No results found for this basename: LABPROT, INR,  in the last 72 hours ABG No results found for this basename: PHART, PCO2, PO2, HCO3,  in the last 72 hours  Studies/Results: No results found.  Anti-infectives: Anti-infectives   None      Assessment/Plan: Post op ileus 11 days post o right colectomy Fever and slight elevation of WBC  Check CT to exclude leak/abscess Pain minimal and having multiple BM still with some nausea.  Keep on clears.   LOS: 4 days    Jemal Miskell A. 08/27/2013

## 2013-08-28 ENCOUNTER — Inpatient Hospital Stay (HOSPITAL_COMMUNITY): Payer: BC Managed Care – PPO

## 2013-08-28 LAB — COMPREHENSIVE METABOLIC PANEL
ALT: 57 U/L — AB (ref 0–53)
AST: 29 U/L (ref 0–37)
Albumin: 3.3 g/dL — ABNORMAL LOW (ref 3.5–5.2)
Alkaline Phosphatase: 82 U/L (ref 39–117)
Anion gap: 15 (ref 5–15)
BILIRUBIN TOTAL: 0.4 mg/dL (ref 0.3–1.2)
BUN: 12 mg/dL (ref 6–23)
CHLORIDE: 96 meq/L (ref 96–112)
CO2: 21 meq/L (ref 19–32)
Calcium: 8.6 mg/dL (ref 8.4–10.5)
Creatinine, Ser: 0.81 mg/dL (ref 0.50–1.35)
GFR calc Af Amer: >90 mL/min (ref >90)
GLUCOSE: 121 mg/dL — AB (ref 70–99)
Potassium: 4.3 mEq/L (ref 3.7–5.3)
SODIUM: 132 meq/L — AB (ref 137–147)
Total Protein: 6.7 g/dL (ref 6.0–8.3)

## 2013-08-28 LAB — CBC
HCT: 34.7 % — ABNORMAL LOW (ref 39.0–52.0)
HEMOGLOBIN: 12.1 g/dL — AB (ref 13.0–17.0)
MCH: 32.9 pg (ref 26.0–34.0)
MCHC: 34.9 g/dL (ref 30.0–36.0)
MCV: 94.3 fL (ref 78.0–100.0)
PLATELETS: 247 10*3/uL (ref 150–400)
RBC: 3.68 MIL/uL — AB (ref 4.22–5.81)
RDW: 13.5 % (ref 11.5–15.5)
WBC: 10 10*3/uL (ref 4.0–10.5)

## 2013-08-28 LAB — GLUCOSE, CAPILLARY: Glucose-Capillary: 146 mg/dL — ABNORMAL HIGH (ref 70–99)

## 2013-08-28 MED ORDER — PANTOPRAZOLE SODIUM 40 MG PO TBEC
40.0000 mg | DELAYED_RELEASE_TABLET | Freq: Every day | ORAL | Status: DC
  Administered 2013-08-28 – 2013-09-02 (×6): 40 mg via ORAL
  Filled 2013-08-28 (×6): qty 1

## 2013-08-28 MED ORDER — AMOXICILLIN-POT CLAVULANATE 875-125 MG PO TABS
1.0000 | ORAL_TABLET | Freq: Two times a day (BID) | ORAL | Status: DC
  Administered 2013-08-28 – 2013-08-29 (×4): 1 via ORAL
  Filled 2013-08-28 (×6): qty 1

## 2013-08-28 MED ORDER — DEXTROSE IN LACTATED RINGERS 5 % IV SOLN
INTRAVENOUS | Status: DC
  Administered 2013-08-28 – 2013-08-29 (×2): via INTRAVENOUS

## 2013-08-28 NOTE — Progress Notes (Signed)
Subjective: In goos spirits.  hAd fever last night but minimal abdominal pain.  No vomiting and having BM.    Objective: Vital signs in last 24 hours: Temp:  [98.2 F (36.8 C)-99.9 F (37.7 C)] 98.2 F (36.8 C) (07/13 2114) Pulse Rate:  [70-76] 76 (07/14 0616) Resp:  [17-20] 20 (07/14 0616) BP: (126-140)/(58-69) 140/66 mmHg (07/14 0616) SpO2:  [97 %-100 %] 98 % (07/14 0616) Last BM Date: 08/27/13  Intake/Output from previous day: 07/13 0701 - 07/14 0700 In: 1686 [P.O.:1456; I.V.:230] Out: 1000 [Urine:1000] Intake/Output this shift:    General appearance: alert and cooperative Resp: no wheezing Incision/Wound:C/D/I staple out still distended but no peritonitis  Lab Results:   Recent Labs  08/27/13 0500 08/28/13 0415  WBC 11.1* 10.0  HGB 11.8* 12.1*  HCT 34.1* 34.7*  PLT 251 247   BMET  Recent Labs  08/27/13 0500 08/28/13 0415  NA 133* 132*  K 3.8 4.3  CL 97 96  CO2 21 21  GLUCOSE 133* 121*  BUN 12 12  CREATININE 0.80 0.81  CALCIUM 8.5 8.6   PT/INR No results found for this basename: LABPROT, INR,  in the last 72 hours ABG No results found for this basename: PHART, PCO2, PO2, HCO3,  in the last 72 hours  Studies/Results: Ct Abdomen Pelvis W Contrast  08/27/2013   CLINICAL DATA:  Abdominal pain, status post partial colectomy 08/16/2013. Prior cholecystectomy and appendectomy.  EXAM: CT ABDOMEN AND PELVIS WITH CONTRAST  TECHNIQUE: Multidetector CT imaging of the abdomen and pelvis was performed using the standard protocol following bolus administration of intravenous contrast.  CONTRAST:  172mL OMNIPAQUE IOHEXOL 300 MG/ML  SOLN  COMPARISON:  06/28/2013  FINDINGS: Minimal dependent subpleural atelectasis and curvilinear left lower lobe nondependent atelectasis is noted. Lung bases are otherwise clear.  Postsurgical change status post right hemicolectomy noted. There is trace fluid and stranding within the mesenteries and overlying anterior abdominal wall but  no focal drainable fluid collection is identified to suggest abscess. No free air. Within the left and mid abdomen, there are multiple loops of mildly dilated small bowel, largest 4.5 cm in the left lower quadrant image 71, with gradual tapering in the right mid abdomen, image 62. No mass lesion is identified at this area. Right colonic anastomotic sutures are identified without adjacent mass lesion or other complicating feature. No contrast extravasation is identified. The remaining colon is decompressed.  Cholecystectomy clips are noted. Liver, adrenal glands, kidneys, spleen, and pancreas are unremarkable. No lymphadenopathy. The bladder is normal. Trace free pelvic fluid is identified. Mild disc degenerative change noted in the lumbar spine most prominent at L4-L5.  IMPRESSION: Numerous dilated small bowel loops within predominantly the left mid abdomen, most compatible with focal ileus given history of recent surgery. Early/partial small bowel obstruction could appear similar.  Trace abdominal fluid and expected incisional and postsurgical/right hemicolectomy changed identified without complicating feature such as abscess formation or free air.   Electronically Signed   By: Conchita Paris M.D.   On: 08/27/2013 12:34    Anti-infectives: Anti-infectives   Start     Dose/Rate Route Frequency Ordered Stop   08/28/13 1000  amoxicillin-clavulanate (AUGMENTIN) 875-125 MG per tablet 1 tablet     1 tablet Oral Every 12 hours 08/28/13 0704        Assessment/Plan:  LOS: 5 days  POD 12 ileus Having BM CT shows ileus no leak or free air Fever  Will remove PICC and D/C TNA Lungs and urine do  not appear to be source wound looks ok Advance to full liquids  Continue to ambulate Check KUB Follow labs  Lamonda Noxon A. 08/28/2013

## 2013-08-29 DIAGNOSIS — E871 Hypo-osmolality and hyponatremia: Secondary | ICD-10-CM

## 2013-08-29 LAB — COMPREHENSIVE METABOLIC PANEL
ALBUMIN: 3.3 g/dL — AB (ref 3.5–5.2)
ALT: 44 U/L (ref 0–53)
AST: 22 U/L (ref 0–37)
Alkaline Phosphatase: 82 U/L (ref 39–117)
Anion gap: 17 — ABNORMAL HIGH (ref 5–15)
BILIRUBIN TOTAL: 0.6 mg/dL (ref 0.3–1.2)
BUN: 7 mg/dL (ref 6–23)
CHLORIDE: 87 meq/L — AB (ref 96–112)
CO2: 19 mEq/L (ref 19–32)
CREATININE: 0.89 mg/dL (ref 0.50–1.35)
Calcium: 8.6 mg/dL (ref 8.4–10.5)
GFR calc Af Amer: >90 mL/min (ref >90)
GFR calc non Af Amer: 90 mL/min — ABNORMAL LOW (ref >90)
Glucose, Bld: 109 mg/dL — ABNORMAL HIGH (ref 70–99)
POTASSIUM: 3.5 meq/L — AB (ref 3.7–5.3)
SODIUM: 123 meq/L — AB (ref 137–147)
Total Protein: 6.4 g/dL (ref 6.0–8.3)

## 2013-08-29 LAB — CBC
HEMATOCRIT: 34.8 % — AB (ref 39.0–52.0)
HEMOGLOBIN: 12.1 g/dL — AB (ref 13.0–17.0)
MCH: 32.6 pg (ref 26.0–34.0)
MCHC: 34.8 g/dL (ref 30.0–36.0)
MCV: 93.8 fL (ref 78.0–100.0)
Platelets: 215 10*3/uL (ref 150–400)
RBC: 3.71 MIL/uL — AB (ref 4.22–5.81)
RDW: 13.6 % (ref 11.5–15.5)
WBC: 11 10*3/uL — AB (ref 4.0–10.5)

## 2013-08-29 NOTE — Progress Notes (Signed)
Subjective: Ambulating.  Having BM.  No vomiting on full liquids.  Fever now low grade.    Objective: Vital signs in last 24 hours: Temp:  [99 F (37.2 C)-99.6 F (37.6 C)] 99 F (37.2 C) (07/15 0507) Pulse Rate:  [81-86] 86 (07/15 0507) Resp:  [20] 20 (07/15 0507) BP: (123-131)/(59-64) 123/64 mmHg (07/15 0507) SpO2:  [97 %-99 %] 99 % (07/15 0507) Last BM Date: 08/28/13  Intake/Output from previous day: 07/14 0701 - 07/15 0700 In: 2391.7 [P.O.:1560; I.V.:831.7] Out: 1025 [Urine:1025] Intake/Output this shift:    Incision/Wound:mild erythema at inferior portion of incision without drainage.  No fluctuance or drainage   Lab Results:   Recent Labs  08/28/13 0415 08/29/13 0037  WBC 10.0 11.0*  HGB 12.1* 12.1*  HCT 34.7* 34.8*  PLT 247 215   BMET  Recent Labs  08/28/13 0415 08/29/13 0037  NA 132* 123*  K 4.3 3.5*  CL 96 87*  CO2 21 19  GLUCOSE 121* 109*  BUN 12 7  CREATININE 0.81 0.89  CALCIUM 8.6 8.6   PT/INR No results found for this basename: LABPROT, INR,  in the last 72 hours ABG No results found for this basename: PHART, PCO2, PO2, HCO3,  in the last 72 hours  Studies/Results: Dg Abd 1 View  08/28/2013   CLINICAL DATA:  Abdominal pain with distention. History of partial colon resection 2 weeks ago. Evaluate for ileus.  EXAM: ABDOMEN - 1 VIEW  COMPARISON:  CT 08/27/2013.  Radiographs 08/25/2013.  FINDINGS: There is persistent moderate diffuse small bowel distension status post right colectomy and reanastomosis. The skin staples have been removed. Multiple surgical clips are present within the right abdomen. There is no supine evidence of free intraperitoneal air. There are no suspicious calcifications or worrisome osseous findings.  IMPRESSION: Persistent moderate diffuse small bowel distension status post right colectomy. Given the recent surgery, findings are likely due to ileus or edema near the anastomosis.   Electronically Signed   By: Camie Patience  M.D.   On: 08/28/2013 08:32   Ct Abdomen Pelvis W Contrast  08/27/2013   CLINICAL DATA:  Abdominal pain, status post partial colectomy 08/16/2013. Prior cholecystectomy and appendectomy.  EXAM: CT ABDOMEN AND PELVIS WITH CONTRAST  TECHNIQUE: Multidetector CT imaging of the abdomen and pelvis was performed using the standard protocol following bolus administration of intravenous contrast.  CONTRAST:  137mL OMNIPAQUE IOHEXOL 300 MG/ML  SOLN  COMPARISON:  06/28/2013  FINDINGS: Minimal dependent subpleural atelectasis and curvilinear left lower lobe nondependent atelectasis is noted. Lung bases are otherwise clear.  Postsurgical change status post right hemicolectomy noted. There is trace fluid and stranding within the mesenteries and overlying anterior abdominal wall but no focal drainable fluid collection is identified to suggest abscess. No free air. Within the left and mid abdomen, there are multiple loops of mildly dilated small bowel, largest 4.5 cm in the left lower quadrant image 71, with gradual tapering in the right mid abdomen, image 62. No mass lesion is identified at this area. Right colonic anastomotic sutures are identified without adjacent mass lesion or other complicating feature. No contrast extravasation is identified. The remaining colon is decompressed.  Cholecystectomy clips are noted. Liver, adrenal glands, kidneys, spleen, and pancreas are unremarkable. No lymphadenopathy. The bladder is normal. Trace free pelvic fluid is identified. Mild disc degenerative change noted in the lumbar spine most prominent at L4-L5.  IMPRESSION: Numerous dilated small bowel loops within predominantly the left mid abdomen, most compatible with focal ileus  given history of recent surgery. Early/partial small bowel obstruction could appear similar.  Trace abdominal fluid and expected incisional and postsurgical/right hemicolectomy changed identified without complicating feature such as abscess formation or free air.    Electronically Signed   By: Conchita Paris M.D.   On: 08/27/2013 12:34    Anti-infectives: Anti-infectives   Start     Dose/Rate Route Frequency Ordered Stop   08/28/13 1000  amoxicillin-clavulanate (AUGMENTIN) 875-125 MG per tablet 1 tablet     1 tablet Oral Every 12 hours 08/28/13 0704        Assessment/Plan: Post op ileus  On full liquids  Keep on this for 1 more day Hyponatremia  Change IVF and recheck in am Fever  Low grade now and WBC 11.  Some mild erythema at inferior part of incision but no drainage.  Keep on augmentin for now and follow.  If no improvement may need to open incision.  OK to shower  LOS: 6 days    Tobi Groesbeck A. 08/29/2013

## 2013-08-30 ENCOUNTER — Inpatient Hospital Stay (HOSPITAL_COMMUNITY): Payer: BC Managed Care – PPO

## 2013-08-30 LAB — CBC
HCT: 33.1 % — ABNORMAL LOW (ref 39.0–52.0)
Hemoglobin: 11.4 g/dL — ABNORMAL LOW (ref 13.0–17.0)
MCH: 32.4 pg (ref 26.0–34.0)
MCHC: 34.4 g/dL (ref 30.0–36.0)
MCV: 94 fL (ref 78.0–100.0)
Platelets: 215 10*3/uL (ref 150–400)
RBC: 3.52 MIL/uL — ABNORMAL LOW (ref 4.22–5.81)
RDW: 13.7 % (ref 11.5–15.5)
WBC: 9.2 10*3/uL (ref 4.0–10.5)

## 2013-08-30 LAB — URINALYSIS, ROUTINE W REFLEX MICROSCOPIC
Bilirubin Urine: NEGATIVE
Glucose, UA: NEGATIVE mg/dL
Hgb urine dipstick: NEGATIVE
KETONES UR: NEGATIVE mg/dL
Leukocytes, UA: NEGATIVE
NITRITE: NEGATIVE
Protein, ur: NEGATIVE mg/dL
Specific Gravity, Urine: 1.018 (ref 1.005–1.030)
UROBILINOGEN UA: 0.2 mg/dL (ref 0.0–1.0)
pH: 6 (ref 5.0–8.0)

## 2013-08-30 LAB — COMPREHENSIVE METABOLIC PANEL
ALT: 43 U/L (ref 0–53)
AST: 29 U/L (ref 0–37)
Albumin: 3 g/dL — ABNORMAL LOW (ref 3.5–5.2)
Alkaline Phosphatase: 74 U/L (ref 39–117)
Anion gap: 17 — ABNORMAL HIGH (ref 5–15)
BUN: 5 mg/dL — AB (ref 6–23)
CALCIUM: 8.3 mg/dL — AB (ref 8.4–10.5)
CO2: 19 mEq/L (ref 19–32)
Chloride: 96 mEq/L (ref 96–112)
Creatinine, Ser: 0.97 mg/dL (ref 0.50–1.35)
GFR calc Af Amer: >90 mL/min (ref >90)
GFR, EST NON AFRICAN AMERICAN: 87 mL/min — AB (ref >90)
GLUCOSE: 123 mg/dL — AB (ref 70–99)
Potassium: 4.1 mEq/L (ref 3.7–5.3)
SODIUM: 132 meq/L — AB (ref 137–147)
Total Bilirubin: 0.5 mg/dL (ref 0.3–1.2)
Total Protein: 6.4 g/dL (ref 6.0–8.3)

## 2013-08-30 MED ORDER — VANCOMYCIN HCL IN DEXTROSE 1-5 GM/200ML-% IV SOLN
1000.0000 mg | Freq: Three times a day (TID) | INTRAVENOUS | Status: DC
  Administered 2013-08-30 – 2013-09-02 (×8): 1000 mg via INTRAVENOUS
  Filled 2013-08-30 (×10): qty 200

## 2013-08-30 MED ORDER — BACITRACIN-POLYMYXIN B OP OINT
TOPICAL_OINTMENT | OPHTHALMIC | Status: DC
  Administered 2013-08-30 – 2013-09-03 (×19): via OPHTHALMIC
  Filled 2013-08-30 (×3): qty 3.5

## 2013-08-30 MED ORDER — DOXYCYCLINE HYCLATE 100 MG PO TABS
100.0000 mg | ORAL_TABLET | Freq: Two times a day (BID) | ORAL | Status: DC
  Administered 2013-08-30: 100 mg via ORAL
  Filled 2013-08-30: qty 1

## 2013-08-30 MED ORDER — PIPERACILLIN-TAZOBACTAM 3.375 G IVPB
3.3750 g | Freq: Three times a day (TID) | INTRAVENOUS | Status: DC
  Administered 2013-08-30 – 2013-08-31 (×2): 3.375 g via INTRAVENOUS
  Filled 2013-08-30 (×5): qty 50

## 2013-08-30 NOTE — Progress Notes (Signed)
Patient with fever of 101.9.  Gave 650mg  Tylenol and notified Dr. Ninfa Linden.  Orders received for repeat CBC tomorrow am.

## 2013-08-30 NOTE — Progress Notes (Signed)
Continues to have fever and no source identified.  Exam shows incision without erythema,  Non tender abdomen,  Clear lungs non tender extremities without swelling but severe redness of eyes and conjunctiva.  Will add neomycin opthalmic gtt and place back on broad spectrum ABX and obtain CRP and blood cultures.  No murmur on exam as well. Just  Had CT 3 days ago and no abdominal pain so would hold off on repeat CT, Will ask ID to see Friday. May need to repeat CT over the weekend if nothing else turns up.  Drugs reviewed as well.

## 2013-08-30 NOTE — Progress Notes (Signed)
  Subjective: Feels ok.  Fever at night.  No dysuria,  Cough or significant abdominal pain.  Had a BM yesterday loose.  No copious amounts of diarrhea.    Objective: Vital signs in last 24 hours: Temp:  [98.4 F (36.9 C)-101.2 F (38.4 C)] 100.1 F (37.8 C) (07/16 0526) Pulse Rate:  [80-93] 93 (07/16 0526) Resp:  [16-18] 16 (07/16 0526) BP: (126-149)/(56-62) 149/62 mmHg (07/16 0526) SpO2:  [98 %-100 %] 100 % (07/16 0526) Last BM Date: 08/29/13  Intake/Output from previous day: 07/15 0701 - 07/16 0700 In: 2278.8 [P.O.:480; I.V.:1798.8] Out: -  Intake/Output this shift:    Resp: clear to auscultation bilaterally Incision/Wound:C/D/I  Distention is less but present.  No rebound or guarding. No significant erythema or fluctuance  Lab Results:   Recent Labs  08/29/13 0037 08/30/13 0550  WBC 11.0* 9.2  HGB 12.1* 11.4*  HCT 34.8* 33.1*  PLT 215 215   BMET  Recent Labs  08/29/13 0037 08/30/13 0550  NA 123* 132*  K 3.5* 4.1  CL 87* 96  CO2 19 19  GLUCOSE 109* 123*  BUN 7 5*  CREATININE 0.89 0.97  CALCIUM 8.6 8.3*   PT/INR No results found for this basename: LABPROT, INR,  in the last 72 hours ABG No results found for this basename: PHART, PCO2, PO2, HCO3,  in the last 72 hours  Studies/Results: Dg Abd 1 View  08/28/2013   CLINICAL DATA:  Abdominal pain with distention. History of partial colon resection 2 weeks ago. Evaluate for ileus.  EXAM: ABDOMEN - 1 VIEW  COMPARISON:  CT 08/27/2013.  Radiographs 08/25/2013.  FINDINGS: There is persistent moderate diffuse small bowel distension status post right colectomy and reanastomosis. The skin staples have been removed. Multiple surgical clips are present within the right abdomen. There is no supine evidence of free intraperitoneal air. There are no suspicious calcifications or worrisome osseous findings.  IMPRESSION: Persistent moderate diffuse small bowel distension status post right colectomy. Given the recent surgery,  findings are likely due to ileus or edema near the anastomosis.   Electronically Signed   By: Camie Patience M.D.   On: 08/28/2013 08:32    Anti-infectives: Anti-infectives   Start     Dose/Rate Route Frequency Ordered Stop   08/30/13 1000  doxycycline (VIBRA-TABS) tablet 100 mg     100 mg Oral Every 12 hours 08/30/13 0709     08/28/13 1000  amoxicillin-clavulanate (AUGMENTIN) 875-125 MG per tablet 1 tablet  Status:  Discontinued     1 tablet Oral Every 12 hours 08/28/13 0704 08/30/13 0709      Assessment/Plan: Ileus improving   Will try soft diet Fever  Only in PM  Will recheck UA and CXR.  Incision looks ok and erythema there is minimal if any.  No fluctuance.  CT done of abdomen unrevealing.  May be drug related.  Will stop reglan and augmentin and change to doxycycline for now.  Central line is out.   If no source found,  Consider D/C in am. He denies abdominal pain,  Nausea or vomiting and wants to go home. Hyponatremia resolved.   LOS: 7 days    Zachary Kim A. 08/30/2013

## 2013-08-31 ENCOUNTER — Telehealth (INDEPENDENT_AMBULATORY_CARE_PROVIDER_SITE_OTHER): Payer: Self-pay

## 2013-08-31 ENCOUNTER — Encounter (HOSPITAL_COMMUNITY): Payer: Self-pay | Admitting: Radiology

## 2013-08-31 ENCOUNTER — Inpatient Hospital Stay (HOSPITAL_COMMUNITY): Payer: BC Managed Care – PPO

## 2013-08-31 DIAGNOSIS — Z87891 Personal history of nicotine dependence: Secondary | ICD-10-CM

## 2013-08-31 DIAGNOSIS — R509 Fever, unspecified: Secondary | ICD-10-CM

## 2013-08-31 DIAGNOSIS — I808 Phlebitis and thrombophlebitis of other sites: Secondary | ICD-10-CM

## 2013-08-31 DIAGNOSIS — D126 Benign neoplasm of colon, unspecified: Secondary | ICD-10-CM | POA: Diagnosis present

## 2013-08-31 DIAGNOSIS — Z9049 Acquired absence of other specified parts of digestive tract: Secondary | ICD-10-CM

## 2013-08-31 DIAGNOSIS — E871 Hypo-osmolality and hyponatremia: Secondary | ICD-10-CM | POA: Diagnosis not present

## 2013-08-31 DIAGNOSIS — R739 Hyperglycemia, unspecified: Secondary | ICD-10-CM | POA: Diagnosis present

## 2013-08-31 DIAGNOSIS — D649 Anemia, unspecified: Secondary | ICD-10-CM | POA: Diagnosis present

## 2013-08-31 LAB — CBC
HCT: 34.6 % — ABNORMAL LOW (ref 39.0–52.0)
HCT: 34.2 % — ABNORMAL LOW (ref 39.0–52.0)
Hemoglobin: 12.2 g/dL — ABNORMAL LOW (ref 13.0–17.0)
Hemoglobin: 11.8 g/dL — ABNORMAL LOW (ref 13.0–17.0)
MCH: 32.9 pg (ref 26.0–34.0)
MCH: 33 pg (ref 26.0–34.0)
MCHC: 35.3 g/dL (ref 30.0–36.0)
MCHC: 34.5 g/dL (ref 30.0–36.0)
MCV: 93.5 fL (ref 78.0–100.0)
MCV: 95.3 fL (ref 78.0–100.0)
PLATELETS: 195 10*3/uL (ref 150–400)
Platelets: 178 10*3/uL (ref 150–400)
RBC: 3.7 MIL/uL — ABNORMAL LOW (ref 4.22–5.81)
RBC: 3.59 MIL/uL — ABNORMAL LOW (ref 4.22–5.81)
RDW: 13.7 % (ref 11.5–15.5)
RDW: 13.9 % (ref 11.5–15.5)
WBC: 8.2 10*3/uL (ref 4.0–10.5)
WBC: 9.3 10*3/uL (ref 4.0–10.5)

## 2013-08-31 LAB — COMPREHENSIVE METABOLIC PANEL
ALT: 54 U/L — AB (ref 0–53)
AST: 33 U/L (ref 0–37)
Albumin: 3.1 g/dL — ABNORMAL LOW (ref 3.5–5.2)
Alkaline Phosphatase: 75 U/L (ref 39–117)
Anion gap: 18 — ABNORMAL HIGH (ref 5–15)
BUN: 4 mg/dL — ABNORMAL LOW (ref 6–23)
CALCIUM: 8.6 mg/dL (ref 8.4–10.5)
CHLORIDE: 91 meq/L — AB (ref 96–112)
CO2: 18 mEq/L — ABNORMAL LOW (ref 19–32)
Creatinine, Ser: 0.98 mg/dL (ref 0.50–1.35)
GFR calc Af Amer: >90 mL/min (ref >90)
GFR calc non Af Amer: 86 mL/min — ABNORMAL LOW (ref >90)
Glucose, Bld: 112 mg/dL — ABNORMAL HIGH (ref 70–99)
Potassium: 3.7 mEq/L (ref 3.7–5.3)
SODIUM: 127 meq/L — AB (ref 137–147)
Total Bilirubin: 0.6 mg/dL (ref 0.3–1.2)
Total Protein: 6.5 g/dL (ref 6.0–8.3)

## 2013-08-31 LAB — C-REACTIVE PROTEIN: CRP: 10.7 mg/dL — ABNORMAL HIGH (ref <0.60)

## 2013-08-31 MED ORDER — ADULT MULTIVITAMIN W/MINERALS CH
1.0000 | ORAL_TABLET | Freq: Every day | ORAL | Status: DC
  Administered 2013-08-31 – 2013-09-02 (×3): 1 via ORAL
  Filled 2013-08-31 (×5): qty 1

## 2013-08-31 MED ORDER — SODIUM CHLORIDE 0.9 % IJ SOLN
10.0000 mL | INTRAMUSCULAR | Status: DC | PRN
  Administered 2013-09-02: 10 mL

## 2013-08-31 MED ORDER — IOHEXOL 300 MG/ML  SOLN
100.0000 mL | Freq: Once | INTRAMUSCULAR | Status: AC | PRN
  Administered 2013-08-31: 100 mL via INTRAVENOUS

## 2013-08-31 MED ORDER — FOLIC ACID 1 MG PO TABS
1.0000 mg | ORAL_TABLET | Freq: Every day | ORAL | Status: DC
  Administered 2013-08-31 – 2013-09-02 (×3): 1 mg via ORAL
  Filled 2013-08-31 (×5): qty 1

## 2013-08-31 MED ORDER — IOHEXOL 300 MG/ML  SOLN
25.0000 mL | INTRAMUSCULAR | Status: AC
  Administered 2013-08-31 (×2): 25 mL via ORAL

## 2013-08-31 MED ORDER — DEXTROSE 5 % IV SOLN
2.0000 g | Freq: Two times a day (BID) | INTRAVENOUS | Status: DC
  Administered 2013-08-31 – 2013-09-02 (×4): 2 g via INTRAVENOUS
  Filled 2013-08-31 (×5): qty 2

## 2013-08-31 MED ORDER — ENSURE COMPLETE PO LIQD
237.0000 mL | Freq: Two times a day (BID) | ORAL | Status: DC
  Administered 2013-08-31 – 2013-09-02 (×5): 237 mL via ORAL

## 2013-08-31 MED ORDER — THIAMINE HCL 100 MG/ML IJ SOLN
100.0000 mg | Freq: Every day | INTRAMUSCULAR | Status: DC
  Filled 2013-08-31 (×3): qty 1

## 2013-08-31 MED ORDER — LORAZEPAM 2 MG/ML IJ SOLN: 1.0000 mg | Freq: Four times a day (QID) | INTRAMUSCULAR | Status: AC | PRN

## 2013-08-31 MED ORDER — VITAMIN B-1 100 MG PO TABS
100.0000 mg | ORAL_TABLET | Freq: Every day | ORAL | Status: DC
  Administered 2013-08-31 – 2013-09-02 (×3): 100 mg via ORAL
  Filled 2013-08-31 (×5): qty 1

## 2013-08-31 MED ORDER — LORAZEPAM 1 MG PO TABS: 1.0000 mg | ORAL_TABLET | Freq: Four times a day (QID) | ORAL | Status: AC | PRN

## 2013-08-31 NOTE — Consult Note (Signed)
[name] for Infectious Disease    Date of Admission:  08/23/2013    Total days of antibiotics 4        Day 2 vancomycin        Day 2 piperacillin tazobactam               Reason for Consult: Postoperative fever    Referring Physician: Dr. Christie Beckers  Principal Problem:   Postoperative fever Active Problems:   Tubulovillous adenoma of colon   S/P partial colectomy   Postoperative ileus   Normocytic anemia   Hyperglycemia   Hyponatremia   . bacitracin-polymyxin b (ophth)   Both Eyes 6 times per day  . calcium carbonate  1 tablet Oral BID  . enoxaparin (LOVENOX) injection  40 mg Subcutaneous Q24H  . feeding supplement (ENSURE COMPLETE)  237 mL Oral BID BM  . folic acid  1 mg Oral Daily  . multivitamin with minerals  1 tablet Oral Daily  . pantoprazole  40 mg Oral Daily  . piperacillin-tazobactam (ZOSYN)  IV  3.375 g Intravenous 3 times per day  . thiamine  100 mg Oral Daily   Or  . thiamine  100 mg Intravenous Daily  . vancomycin  1,000 mg Intravenous Q8H    Recommendations: 1. Continue vancomycin  2. Change piperacillin tazobactam to cefepime 3. Await blood culture results 4. Stool for C. difficile PCR   Assessment: Zachary Kim has multiple potential sources of fever but I am most suspicious of his left arm phlebitis at a previous IV site. I agree with continuing vancomycin. He does not need the broad anaerobic activity afforded by piperacillin tazobactam. I will change it to cefepime continued gram-negative rod coverage. Blood cultures done yesterday they be affected by previous antibiotic therapy with amoxicillin. His chest CT scan suggests the possibility of lower lobe pneumonia but this is not evident by history or exam. Although his recent diarrhea is probably due to his partial colectomy I will check a stool for C. difficile PCR since that would be an obvious cause for fever. His CT scan does not reveal any obvious intra-abdominal source for fever.  The conjunctival redness his unusual. I doubt that he has developed viral conjunctivitis with fever but that is a more remote possibility.    HPI: Zachary Kim is a 63 y.o. male laparoscopic cholecystectomy in March. He was noted have some sacral distention at that time and was eventually found to have a cecal mass that turned out to be a tubulovillous adenoma. He was readmitted on July 2 underwent open resection of his terminal ileum and descending colon. He seemed to be recovering uneventfully except for postoperative ileus and was expecting to go home soon. However he has recently developed fever. He states he feels extremely worn out because of the fever. He has been having loose, watery stools ever since his surgery. He is having some bloating but no current nausea or vomiting. He denies any new cough or shortness of breath. He does not have any dysuria.   Review of Systems: Constitutional: positive for anorexia, chills, fevers, malaise and sweats, negative for weight loss Eyes: positive for redness Ears, nose, mouth, throat, and face: negative Respiratory: negative Cardiovascular: negative Gastrointestinal: positive for diarrhea and bloating, negative for abdominal pain, nausea and vomiting Genitourinary:negative  Past Medical History  Diagnosis Date  . Hyperlipidemia   . Arthritis   . GERD (gastroesophageal reflux disease)   .  HOH (hard of hearing)   . Gallstones   . Asthma     if have cold  . Pneumonia     hx    History  Substance Use Topics  . Smoking status: Former Smoker -- 2.00 packs/day for 12 years    Types: Cigarettes    Quit date: 04/10/1983  . Smokeless tobacco: Current User    Types: Chew     Comment: chew tobacco, tobacco info given 06/29/13  . Alcohol Use: 1.8 oz/week    3 Cans of beer per week     Comment: daily    Family History  Problem Relation Age of Onset  . Colon cancer Neg Hx   . Diabetes Mother   . Heart disease Father     had valve  replacement   Allergies  Allergen Reactions  . Tape Other (See Comments)    Adhesive tape causes blistered skin    OBJECTIVE: Blood pressure 112/54, pulse 88, temperature 99.6 F (37.6 C), temperature source Oral, resp. rate 17, height 6\' 2"  (1.88 m), weight 99.7 kg (219 lb 12.8 oz), SpO2 99.00%. General: He appears uncomfortable but is alert and conversant Skin: No rash but he does have some phlebitis in his left forearm where an IV was removed yesterday. It is red, swollen and tender. There is no fluctuance or drainage Eyes: Bilateral conjunctival redness without discharge Lungs: Clear Cor: Regular S1 and S2 with no murmurs Abdomen: Distended but soft and nontender. He has a healed right upper quadrant incision. He has Steri-Strips over a healing midline incision without evidence of infection Joints and extremities: No acute abnormality  Lab Results Lab Results  Component Value Date   WBC 9.3 08/31/2013   HGB 11.8* 08/31/2013   HCT 34.2* 08/31/2013   MCV 95.3 08/31/2013   PLT 178 08/31/2013    Lab Results  Component Value Date   CREATININE 0.98 08/31/2013   BUN 4* 08/31/2013   NA 127* 08/31/2013   K 3.7 08/31/2013   CL 91* 08/31/2013   CO2 18* 08/31/2013    Lab Results  Component Value Date   ALT 54* 08/31/2013   AST 33 08/31/2013   ALKPHOS 75 08/31/2013   BILITOT 0.6 08/31/2013    Urinalysis: Normal  Microbiology: Blood cultures 08/30/2013: No growth to date  CT CHEST, ABDOMEN, AND PELVIS WITH CONTRAST   IMPRESSION:  New patchy bilateral lower lobe predominant and right middle lobe  airspace disease, most compatible with pneumonia especially given  the history of fever. Increased small bowel distention predominantly over the left mid  abdomen with tapering at the level of the right lower quadrant  anteriorly. This again could represent focal ileus or small-bowel  obstruction with approximate transition point reidentified at the  right lower quadrant. No evidence for  perforation or free air.   By: Conchita Paris M.D.  On: 08/31/2013 10:40  Michel Bickers, MD [facility] [phone] pager   [phone] cell 08/31/2013, 4:27 PM

## 2013-08-31 NOTE — Telephone Encounter (Signed)
Called [phone] for Inpatient Infectious Disease Consult.  [facility] operator paged Dr. Megan Salon to [facility] for Inpatient consult for [facility]

## 2013-08-31 NOTE — Progress Notes (Signed)
Subjective: Fevers overnight abdominal pain a 2 at best.  No nausea or vomiting.  Having BM.    Objective: Vital signs in last 24 hours: Temp:  [99.5 F (37.5 C)-102.7 F (39.3 C)] 102.2 F (39 C) (07/16 2105) Pulse Rate:  [84-98] 98 (07/17 0606) Resp:  [18-20] 18 (07/17 0606) BP: (97-150)/(63-75) 150/70 mmHg (07/17 0606) SpO2:  [98 %-100 %] 100 % (07/17 0606) Last BM Date: 08/30/13  Intake/Output from previous day: 07/16 0701 - 07/17 0700 In: 600 [P.O.:600] Out: 1000 [Urine:1000] Intake/Output this shift:    Resp: clear to auscultation bilaterally Cardio: regular rate and rhythm, S1, S2 normal, no murmur, click, rub or gallop Incision/Wound:C/D/I no erythema or drainage.  Distended but BS present.  Less distention than before no peritonitis  Lab Results:   Recent Labs  08/31/13 0029 08/31/13 0442  WBC 8.2 9.3  HGB 12.2* 11.8*  HCT 34.6* 34.2*  PLT 195 178   BMET  Recent Labs  08/30/13 0550 08/31/13 0029  NA 132* 127*  K 4.1 3.7  CL 96 91*  CO2 19 18*  GLUCOSE 123* 112*  BUN 5* 4*  CREATININE 0.97 0.98  CALCIUM 8.3* 8.6   PT/INR No results found for this basename: LABPROT, INR,  in the last 72 hours ABG No results found for this basename: PHART, PCO2, PO2, HCO3,  in the last 72 hours  Studies/Results: Dg Chest Port 1 View  08/30/2013   CLINICAL DATA:  Symptoms of esophageal reflux, history of open partial colectomy  EXAM: PORTABLE CHEST - 1 VIEW  COMPARISON:  Chest x-ray of 08/08/2013  FINDINGS: Aeration of the lungs has improved with resolution of the left basilar atelectasis. No infiltrate or effusion is seen. Mediastinal contours are stable. The heart is within upper limits of normal. No bony abnormality is seen.  IMPRESSION: Improved aeration.  No active disease.   Electronically Signed   By: Ivar Drape M.D.   On: 08/30/2013 08:07   Dg Abd Portable 1v  08/30/2013   CLINICAL DATA:  Open partial colectomy, ileus, followup  EXAM: PORTABLE ABDOMEN -  1 VIEW  COMPARISON:  Portable abdomen 08/28/2013  FINDINGS: There is little change in gaseous distention of small bowel, still most compatible with postoperative ileus. A partial small bowel obstruction cannot be excluded on this portable supine film. Surgical clips are noted in the right upper quadrant. No bony abnormality is seen.  IMPRESSION: Little change in gaseous distention of small bowel consistent with either ileus or partial SBO.   Electronically Signed   By: Ivar Drape M.D.   On: 08/30/2013 08:09    Anti-infectives: Anti-infectives   Start     Dose/Rate Route Frequency Ordered Stop   08/30/13 2200  piperacillin-tazobactam (ZOSYN) IVPB 3.375 g    Comments:  Pharmacy to check dosing   3.375 g 12.5 mL/hr over 240 Minutes Intravenous 3 times per day 08/30/13 1910     08/30/13 2030  vancomycin (VANCOCIN) IVPB 1000 mg/200 mL premix     1,000 mg 200 mL/hr over 60 Minutes Intravenous Every 8 hours 08/30/13 1910     08/30/13 1000  doxycycline (VIBRA-TABS) tablet 100 mg  Status:  Discontinued     100 mg Oral Every 12 hours 08/30/13 0709 08/30/13 1910   08/28/13 1000  amoxicillin-clavulanate (AUGMENTIN) 875-125 MG per tablet 1 tablet  Status:  Discontinued     1 tablet Oral Every 12 hours 08/28/13 0704 08/30/13 0709      Assessment/Plan: Ileus  POD 15 right colectomy  Having BM and tolerating soft diet.  distention present but better Fever    source unknown  Repeat abdominal CT today and replace PICC for IV abx.  Ask ID to see for opinion and other recs. CRP    elevated   BC pending urine clean and CXR ok ETOH use       Wife relates a 15 beer a day drinking habit.  Will place on CIWA protocol.   Hyponatremia    Follow on NS  Replace PICC today   LOS: 8 days    Nyle Limb A. 08/31/2013

## 2013-08-31 NOTE — Telephone Encounter (Signed)
Dr. Megan Salon return page-  Information given for inpatient consult for infectious disease.  Dr. Megan Salon aware and will consult patient

## 2013-08-31 NOTE — Progress Notes (Signed)
NUTRITION FOLLOW UP  Intervention:   - Ensure Complete po BID, each supplement provides 350 kcal and 13 grams of protein - RD will continue to follow for nutrition care plan  Nutrition Dx:   Inadequate oral intake related to altered GI function as evidenced by need for bowel rest; ongoing  Goal:   Pt to meet >/= 90% of their estimated nutrition needs; improving  Monitor:   Weight trends, lab trends, diet advancement, acceptance of supplements, bowel movements  Assessment:   PMHx significant for HLD, arthritis, GERD, PNA. Admitted on POD#7 of laparoscopic open partial colectomy and appendectomy for a cecal mass. Patient was discharged home and developed n/v and was unable to keep any liquids down. Work-up reveals likely ileus.   7/10: NGT placed on admission and set to suction. Surgery ordered TPN this morning. Per chart, during previous admission, pt was either NPO/Clear Liquid from 7/2-7/5. Advanced to Full Liquids and then a solid diet 7/6-7/8. Patient unable to tolerate any oral nutrition since evening of 7/8. Per chart review, patient's usual body weight is 230 lb. Current weight is down to 219 lb. This is a weight change of 5% in less than 1 month and is significant for this time frame.  Pt meets criteria for severe MALNUTRITION in the context of acute illness as evidenced by 5% wt loss x < 1 month and intake of <50% x at least 5 days.  7/17: - Pt reports that he is able to eat very little po. TPN has ben d/c'd and diet has been advanced to a soft diet. He reports several episodes of diarrhea. Meal completion is recorded as 75%. Pt will be sent Ensure Complete to supplement diet with calories and protein.  Height: Ht Readings from Last 1 Encounters:  08/23/13 6\' 2"  (1.88 m)    Weight Status:   Wt Readings from Last 1 Encounters:  08/23/13 219 lb 12.8 oz (99.7 kg)    Re-estimated needs:  Kcal: 2200-2400 Protein: 120-130 g Fluid: 2.2-2.5 L/day  Skin: abdominal incision    Diet Order: Criss Rosales   Intake/Output Summary (Last 24 hours) at 08/31/13 1459 Last data filed at 08/31/13 1410  Gross per 24 hour  Intake    722 ml  Output      0 ml  Net    722 ml    Last BM: 7/16   Labs:   Recent Labs Lab 08/25/13 0452 08/27/13 0500  08/29/13 0037 08/30/13 0550 08/31/13 0029  NA 141 133*  < > 123* 132* 127*  K 3.4* 3.8  < > 3.5* 4.1 3.7  CL 101 97  < > 87* 96 91*  CO2 26 21  < > 19 19 18*  BUN 10 12  < > 7 5* 4*  CREATININE 1.06 0.80  < > 0.89 0.97 0.98  CALCIUM 9.2 8.5  < > 8.6 8.3* 8.6  MG 2.3 2.1  --   --   --   --   PHOS 3.9 3.5  --   --   --   --   GLUCOSE 124* 133*  < > 109* 123* 112*  < > = values in this interval not displayed.  CBG (last 3)  No results found for this basename: GLUCAP,  in the last 72 hours  Scheduled Meds: . bacitracin-polymyxin b (ophth)   Both Eyes 6 times per day  . calcium carbonate  1 tablet Oral BID  . enoxaparin (LOVENOX) injection  40 mg Subcutaneous Q24H  .  folic acid  1 mg Oral Daily  . multivitamin with minerals  1 tablet Oral Daily  . pantoprazole  40 mg Oral Daily  . piperacillin-tazobactam (ZOSYN)  IV  3.375 g Intravenous 3 times per day  . thiamine  100 mg Oral Daily   Or  . thiamine  100 mg Intravenous Daily  . vancomycin  1,000 mg Intravenous Q8H    Continuous Infusions: . dextrose 5 % and 0.9% NaCl 75 mL/hr at 08/31/13 [place] RD, LDN

## 2013-08-31 NOTE — Progress Notes (Signed)
Peripherally Inserted Central Catheter/Midline Placement  The IV Nurse has discussed with the patient and/or persons authorized to consent for the patient, the purpose of this procedure and the potential benefits and risks involved with this procedure.  The benefits include less needle sticks, lab draws from the catheter and patient may be discharged home with the catheter.  Risks include, but not limited to, infection, bleeding, blood clot (thrombus formation), and puncture of an artery; nerve damage and irregular heat beat.  Alternatives to this procedure were also discussed.  PICC/Midline Placement Documentation        Zachary Kim 08/31/2013, 7:17 PM

## 2013-08-31 NOTE — Telephone Encounter (Signed)
Message copied by Ivor Costa on Fri Aug 31, 2013  1:10 PM ------      Message from: Erroll Luna A      Created: Thu Aug 30, 2013  7:57 PM       Please call in consult for infectious disease to see in 6N01    2 weeks s/p partial colectomy with fever of unknown origin      thx      TC ------

## 2013-09-01 DIAGNOSIS — R5082 Postprocedural fever: Secondary | ICD-10-CM

## 2013-09-01 DIAGNOSIS — Z9889 Other specified postprocedural states: Secondary | ICD-10-CM

## 2013-09-01 DIAGNOSIS — R197 Diarrhea, unspecified: Secondary | ICD-10-CM

## 2013-09-01 LAB — COMPREHENSIVE METABOLIC PANEL
ALBUMIN: 2.8 g/dL — AB (ref 3.5–5.2)
ALT: 53 U/L (ref 0–53)
AST: 29 U/L (ref 0–37)
Alkaline Phosphatase: 66 U/L (ref 39–117)
Anion gap: 15 (ref 5–15)
BUN: 4 mg/dL — ABNORMAL LOW (ref 6–23)
CALCIUM: 8.2 mg/dL — AB (ref 8.4–10.5)
CHLORIDE: 91 meq/L — AB (ref 96–112)
CO2: 20 mEq/L (ref 19–32)
CREATININE: 0.89 mg/dL (ref 0.50–1.35)
GFR calc Af Amer: >90 mL/min (ref >90)
GFR calc non Af Amer: 90 mL/min — ABNORMAL LOW (ref >90)
Glucose, Bld: 147 mg/dL — ABNORMAL HIGH (ref 70–99)
Potassium: 3.1 mEq/L — ABNORMAL LOW (ref 3.7–5.3)
SODIUM: 126 meq/L — AB (ref 137–147)
Total Bilirubin: 0.5 mg/dL (ref 0.3–1.2)
Total Protein: 6.1 g/dL (ref 6.0–8.3)

## 2013-09-01 LAB — CBC
HCT: 30.5 % — ABNORMAL LOW (ref 39.0–52.0)
Hemoglobin: 10.9 g/dL — ABNORMAL LOW (ref 13.0–17.0)
MCH: 32.3 pg (ref 26.0–34.0)
MCHC: 35.7 g/dL (ref 30.0–36.0)
MCV: 90.5 fL (ref 78.0–100.0)
PLATELETS: 186 10*3/uL (ref 150–400)
RBC: 3.37 MIL/uL — ABNORMAL LOW (ref 4.22–5.81)
RDW: 13.7 % (ref 11.5–15.5)
WBC: 7.8 10*3/uL (ref 4.0–10.5)

## 2013-09-01 LAB — CLOSTRIDIUM DIFFICILE BY PCR: Toxigenic C. Difficile by PCR: NEGATIVE

## 2013-09-01 LAB — VANCOMYCIN, TROUGH: Vancomycin Tr: 11.9 ug/mL (ref 10.0–20.0)

## 2013-09-01 MED ORDER — POLYMYXIN B-TRIMETHOPRIM 10000-0.1 UNIT/ML-% OP SOLN
2.0000 [drp] | Freq: Four times a day (QID) | OPHTHALMIC | Status: DC
  Administered 2013-09-01 – 2013-09-03 (×5): 2 [drp] via OPHTHALMIC
  Filled 2013-09-01 (×2): qty 10

## 2013-09-01 MED ORDER — POTASSIUM CHLORIDE CRYS ER 20 MEQ PO TBCR
40.0000 meq | EXTENDED_RELEASE_TABLET | Freq: Two times a day (BID) | ORAL | Status: AC
  Administered 2013-09-01 (×2): 40 meq via ORAL
  Filled 2013-09-01 (×2): qty 2

## 2013-09-01 NOTE — Progress Notes (Signed)
Bilat eyes are red, using ointment with no improvement.  Left inner forearm is red and warm.  Temp 99.9.

## 2013-09-01 NOTE — Progress Notes (Signed)
ANTIBIOTIC CONSULT NOTE - FOLLOW UP  Pharmacy Consult for vancomycin Indication: rule out sepsis  Allergies  Allergen Reactions  . Tape Other (See Comments)    Adhesive tape causes blistered skin    Patient Measurements: Height: 6\' 2"  (188 cm) Weight: 219 lb 12.8 oz (99.7 kg) IBW/kg (Calculated) : 82.2  Vital Signs: Temp: 99.9 F (37.7 C) (07/18 0905) Temp src: Oral (07/18 0905) BP: 118/51 mmHg (07/18 0541) Pulse Rate: 85 (07/18 0541) Intake/Output from previous day: 07/17 0701 - 07/18 0700 In: 1967 [I.V.:1967] Out: 950 [Urine:950] Intake/Output from this shift: Total I/O In: 240 [P.O.:240] Out: -   Labs:  Recent Labs  08/30/13 0550 08/31/13 0029 08/31/13 0442 09/01/13 0500  WBC 9.2 8.2 9.3 7.8  HGB 11.4* 12.2* 11.8* 10.9*  PLT 215 195 178 186  CREATININE 0.97 0.98  --  0.89   Estimated Creatinine Clearance: 108.6 ml/min (by C-G formula based on Cr of 0.89).  Recent Labs  09/01/13 1113  [place] 11.9     Microbiology: Recent Results (from the past 720 hour(s))  CULTURE, BLOOD (ROUTINE X 2)     Status: None   Collection Time    08/30/13  8:35 PM      Result Value Ref Range Status   Specimen Description BLOOD RIGHT ARM   Final   Special Requests     Final   Value: BOTTLES DRAWN AEROBIC AND ANAEROBIC 10CC AEROBIC 6.5CC ANAEROBIC   Culture  Setup Time     Final   Value: 08/31/2013 01:20     Performed at [facility]   Culture     Final   Value:        BLOOD CULTURE RECEIVED NO GROWTH TO DATE CULTURE WILL BE HELD FOR 5 DAYS BEFORE ISSUING A FINAL NEGATIVE REPORT     Performed at [facility]   Report Status PENDING   Incomplete  CULTURE, BLOOD (ROUTINE X 2)     Status: None   Collection Time    08/30/13  8:45 PM      Result Value Ref Range Status   Specimen Description BLOOD RIGHT HAND   Final   Special Requests BOTTLES DRAWN AEROBIC AND ANAEROBIC 10CC EACH   Final   Culture  Setup Time     Final   Value: 08/31/2013 01:20   Performed at [facility]   Culture     Final   Value:        BLOOD CULTURE RECEIVED NO GROWTH TO DATE CULTURE WILL BE HELD FOR 5 DAYS BEFORE ISSUING A FINAL NEGATIVE REPORT     Performed at [facility]   Report Status PENDING   Incomplete  CLOSTRIDIUM DIFFICILE BY PCR     Status: None   Collection Time    08/31/13  9:53 PM      Result Value Ref Range Status   C difficile by pcr NEGATIVE  NEGATIVE Final    Assessment: Patient continues to receive vancomycin 1g IV q8h and cefepime 2g q12 empiric treatment for fever, possible thrombophlebitis.  I ordered a vancomycin trough which was resulted at 11.9.  This is slightly below the goal of 15-20mg /L, but I suspect this value will rise over time.  Goal of Therapy:  Vancomycin trough level 15-20 mcg/ml  Plan:  Follow up culture results Continue vancomycin 1g IV q8h and cefepime 2g IV q12h Continue to monitor renal function with medical teams  Candie Mile 09/01/2013,1:26 PM

## 2013-09-01 NOTE — Progress Notes (Signed)
Subjective: Tolerating some of his diet.  Having loose BMs.  Objective: Vital signs in last 24 hours: Temp:  [99.6 F (37.6 C)-101.3 F (38.5 C)] 99.9 F (37.7 C) (07/18 0905) Pulse Rate:  [81-89] 85 (07/18 0541) Resp:  [17-18] 18 (07/18 0541) BP: (112-140)/(51-64) 118/51 mmHg (07/18 0541) SpO2:  [97 %-99 %] 97 % (07/18 0541) Last BM Date: 08/31/13  Intake/Output from previous day: 07/17 0701 - 07/18 0700 In: 1967 [I.V.:1967] Out: 950 [Urine:950] Intake/Output this shift:    PE: General- In NAD Abdomen-slightly firm and distended, few bowel sounds, incision clean and intact  Lab Results:   Recent Labs  08/31/13 0442 09/01/13 0500  WBC 9.3 7.8  HGB 11.8* 10.9*  HCT 34.2* 30.5*  PLT 178 186   BMET  Recent Labs  08/31/13 0029 09/01/13 0500  NA 127* 126*  K 3.7 3.1*  CL 91* 91*  CO2 18* 20  GLUCOSE 112* 147*  BUN 4* 4*  CREATININE 0.98 0.89  CALCIUM 8.6 8.2*   PT/INR No results found for this basename: LABPROT, INR,  in the last 72 hours Comprehensive Metabolic Panel:    Component Value Date/Time   NA 126* 09/01/2013 0500   NA 127* 08/31/2013 0029   K 3.1* 09/01/2013 0500   K 3.7 08/31/2013 0029   CL 91* 09/01/2013 0500   CL 91* 08/31/2013 0029   CO2 20 09/01/2013 0500   CO2 18* 08/31/2013 0029   BUN 4* 09/01/2013 0500   BUN 4* 08/31/2013 0029   CREATININE 0.89 09/01/2013 0500   CREATININE 0.98 08/31/2013 0029   GLUCOSE 147* 09/01/2013 0500   GLUCOSE 112* 08/31/2013 0029   CALCIUM 8.2* 09/01/2013 0500   CALCIUM 8.6 08/31/2013 0029   AST 29 09/01/2013 0500   AST 33 08/31/2013 0029   ALT 53 09/01/2013 0500   ALT 54* 08/31/2013 0029   ALKPHOS 66 09/01/2013 0500   ALKPHOS 75 08/31/2013 0029   BILITOT 0.5 09/01/2013 0500   BILITOT 0.6 08/31/2013 0029   PROT 6.1 09/01/2013 0500   PROT 6.5 08/31/2013 0029   ALBUMIN 2.8* 09/01/2013 0500   ALBUMIN 3.1* 08/31/2013 0029     Studies/Results: Ct Chest W Contrast  08/31/2013   CLINICAL DATA:  Fever for 2 days. Partial  colectomy 08/16/2013. Prior cholecystectomy March 2015.  EXAM: CT CHEST, ABDOMEN, AND PELVIS WITH CONTRAST  TECHNIQUE: Multidetector CT imaging of the chest, abdomen and pelvis was performed following the standard protocol during bolus administration of intravenous contrast.  CONTRAST:  143mL OMNIPAQUE IOHEXOL 300 MG/ML  SOLN  COMPARISON:  Chest radiograph 08/20/2013, CT abdomen/ pelvis 08/27/2013  FINDINGS: CT CHEST FINDINGS  Great vessels are normal in caliber. Heart size is normal. No pericardial or pleural effusion. No mediastinal or hilar lymphadenopathy. Direct aortic arch origin of the left vertebral artery incidentally noted.  Minimal right apical pleural thickening is identified. Patchy right lower lobe and middle lobe airspace opacity is identified with a reticulonodular configuration, for example image 37. Minimal right lower lobe central bronchial wall thickening is evident. There is also similar but less prominent reticular nodular left lower lobe consolidation, image 31 suspect image 30.  CT ABDOMEN AND PELVIS FINDINGS  Cholecystectomy clips are noted. Postoperative change after right partial hemicolectomy noted. Anterior abdominal wall stranding is present. No focal fluid collection is seen to suggest abscess formation.  There are numerous dilated loops of small bowel, largest in the left mid abdomen measuring 5.1 cm image 86, for example. There is gradual tapering to  the right lower quadrant with approximate transition point again noted in the right lower quadrant subjacent to the right anterolateral abdominal wall, image 98. Distal ileal decompression is identified. Colonic decompression is identified.  Liver, adrenal glands, kidneys, spleen, and pancreas are normal. A few small porta hepatis nodes are identified, largest 0.9 cm image 68, unchanged. No free air. No bowel wall thickening. The bladder is unremarkable. Disc degenerative change noted in the lumbar spine. No focal acute osseous  abnormality.  IMPRESSION: New patchy bilateral lower lobe predominant and right middle lobe airspace disease, most compatible with pneumonia especially given the history of fever.  Increased small bowel distention predominantly over the left mid abdomen with tapering at the level of the right lower quadrant anteriorly. This again could represent focal ileus or small-bowel obstruction with approximate transition point reidentified at the right lower quadrant. No evidence for perforation or free air.   Electronically Signed   By: Conchita Paris M.D.   On: 08/31/2013 10:40   Ct Abdomen Pelvis W Contrast  08/31/2013   CLINICAL DATA:  Fever for 2 days. Partial colectomy 08/16/2013. Prior cholecystectomy March 2015.  EXAM: CT CHEST, ABDOMEN, AND PELVIS WITH CONTRAST  TECHNIQUE: Multidetector CT imaging of the chest, abdomen and pelvis was performed following the standard protocol during bolus administration of intravenous contrast.  CONTRAST:  125mL OMNIPAQUE IOHEXOL 300 MG/ML  SOLN  COMPARISON:  Chest radiograph 08/20/2013, CT abdomen/ pelvis 08/27/2013  FINDINGS: CT CHEST FINDINGS  Great vessels are normal in caliber. Heart size is normal. No pericardial or pleural effusion. No mediastinal or hilar lymphadenopathy. Direct aortic arch origin of the left vertebral artery incidentally noted.  Minimal right apical pleural thickening is identified. Patchy right lower lobe and middle lobe airspace opacity is identified with a reticulonodular configuration, for example image 37. Minimal right lower lobe central bronchial wall thickening is evident. There is also similar but less prominent reticular nodular left lower lobe consolidation, image 31 suspect image 30.  CT ABDOMEN AND PELVIS FINDINGS  Cholecystectomy clips are noted. Postoperative change after right partial hemicolectomy noted. Anterior abdominal wall stranding is present. No focal fluid collection is seen to suggest abscess formation.  There are numerous  dilated loops of small bowel, largest in the left mid abdomen measuring 5.1 cm image 86, for example. There is gradual tapering to the right lower quadrant with approximate transition point again noted in the right lower quadrant subjacent to the right anterolateral abdominal wall, image 98. Distal ileal decompression is identified. Colonic decompression is identified.  Liver, adrenal glands, kidneys, spleen, and pancreas are normal. A few small porta hepatis nodes are identified, largest 0.9 cm image 68, unchanged. No free air. No bowel wall thickening. The bladder is unremarkable. Disc degenerative change noted in the lumbar spine. No focal acute osseous abnormality.  IMPRESSION: New patchy bilateral lower lobe predominant and right middle lobe airspace disease, most compatible with pneumonia especially given the history of fever.  Increased small bowel distention predominantly over the left mid abdomen with tapering at the level of the right lower quadrant anteriorly. This again could represent focal ileus or small-bowel obstruction with approximate transition point reidentified at the right lower quadrant. No evidence for perforation or free air.   Electronically Signed   By: Conchita Paris M.D.   On: 08/31/2013 10:40    Anti-infectives: Anti-infectives   Start     Dose/Rate Route Frequency Ordered Stop   08/31/13 2200  ceFEPIme (MAXIPIME) 2 g in dextrose 5 %  50 mL IVPB     2 g 100 mL/hr over 30 Minutes Intravenous Every 12 hours 08/31/13 1648     08/30/13 2200  piperacillin-tazobactam (ZOSYN) IVPB 3.375 g  Status:  Discontinued    Comments:  Pharmacy to check dosing   3.375 g 12.5 mL/hr over 240 Minutes Intravenous 3 times per day 08/30/13 1910 08/31/13 1648   08/30/13 2030  vancomycin (VANCOCIN) IVPB 1000 mg/200 mL premix     1,000 mg 200 mL/hr over 60 Minutes Intravenous Every 8 hours 08/30/13 1910     08/30/13 1000  doxycycline (VIBRA-TABS) tablet 100 mg  Status:  Discontinued     100 mg  Oral Every 12 hours 08/30/13 0709 08/30/13 1910   08/28/13 1000  amoxicillin-clavulanate (AUGMENTIN) 875-125 MG per tablet 1 tablet  Status:  Discontinued     1 tablet Oral Every 12 hours 08/28/13 0704 08/30/13 0709      Assessment Right colectomy 08/16/13-has prolonged postop ileus which is slowly improving; no abscess on 7/17 CT Pneumonia and fever-ID involved; abxs changed yesterday. Hyponatremia-unchanged Hypokalemia Etoh use-On CIWA protocol   LOS: 9 days   Plan: Correct hypokalemia.  Continue IV abxs.   Odis Hollingshead 09/01/2013

## 2013-09-01 NOTE — Progress Notes (Signed)
C. Diff negative

## 2013-09-01 NOTE — Progress Notes (Signed)
[facility]    Date of Admission:  08/23/2013   Total days of antibiotics 5        Day 2 cefepime        Day 3 vanco           ID: Zachary Kim is a 63 y.o. male with   Principal Problem:   Postoperative fever Active Problems:   Postoperative ileus   Tubulovillous adenoma of colon   S/P partial colectomy   Normocytic anemia   Hyperglycemia   Hyponatremia    Subjective: Afebrile today. Still having diarrhea. Now both eyes are increasingly injected and feel dry. Dry cough  Medications:  . bacitracin-polymyxin b (ophth)   Both Eyes 6 times per day  . calcium carbonate  1 tablet Oral BID  . ceFEPime (MAXIPIME) IV  2 g Intravenous Q12H  . enoxaparin (LOVENOX) injection  40 mg Subcutaneous Q24H  . feeding supplement (ENSURE COMPLETE)  237 mL Oral BID BM  . folic acid  1 mg Oral Daily  . multivitamin with minerals  1 tablet Oral Daily  . pantoprazole  40 mg Oral Daily  . potassium chloride  40 mEq Oral BID  . thiamine  100 mg Oral Daily   Or  . thiamine  100 mg Intravenous Daily  . vancomycin  1,000 mg Intravenous Q8H    Objective: Vital signs in last 24 hours: Temp:  [99.9 F (37.7 C)-101.3 F (38.5 C)] 99.9 F (37.7 C) (07/18 0905) Pulse Rate:  [81-89] 85 (07/18 0541) Resp:  [17-18] 18 (07/18 0541) BP: (118-140)/(51-64) 118/51 mmHg (07/18 0541) SpO2:  [97 %-98 %] 97 % (07/18 0541)  Physical Exam  Constitutional: He is oriented to person, place, and time. He appears well-developed and well-nourished. No distress.  HENT: bilateral conjunctivitis Mouth/Throat: Oropharynx is clear and moist. No oropharyngeal exudate.  Cardiovascular: Normal rate, regular rhythm and normal heart sounds. Exam reveals no gallop and no friction rub.  No murmur heard.  Pulmonary/Chest: Effort normal and breath sounds normal. No respiratory distress. He has no wheezes.  Abdominal: Soft. Bowel sounds are normal. He exhibits no distension. There is no  tenderness.  Lymphadenopathy:  He has no cervical adenopathy.  Neurological: He is alert and oriented to person, place, and time.  Skin: Skin is warm and dry. No rash noted. No erythema.  Psychiatric: He has a normal mood and affect. His behavior is normal.    Lab Results  Recent Labs  08/31/13 0029 08/31/13 0442 09/01/13 0500  WBC 8.2 9.3 7.8  HGB 12.2* 11.8* 10.9*  HCT 34.6* 34.2* 30.5*  NA 127*  --  126*  K 3.7  --  3.1*  CL 91*  --  91*  CO2 18*  --  20  BUN 4*  --  4*  CREATININE 0.98  --  0.89   Liver Panel  Recent Labs  08/31/13 0029 09/01/13 0500  PROT 6.5 6.1  ALBUMIN 3.1* 2.8*  AST 33 29  ALT 54* 53  ALKPHOS 75 66  BILITOT 0.6 0.5   Sedimentation Rate No results found for this basename: ESRSEDRATE,  in the last 72 hours C-Reactive Protein  Recent Labs  08/30/13 2035  CRP 10.7*    Microbiology: 7/16 blood cx ngtd 7/16 cdiff negative Studies/Results: Ct Chest W Contrast  08/31/2013   CLINICAL DATA:  Fever for 2 days. Partial colectomy 08/16/2013. Prior cholecystectomy March 2015.  EXAM: CT CHEST, ABDOMEN, AND PELVIS WITH CONTRAST  TECHNIQUE: Multidetector CT imaging of the chest, abdomen and pelvis was performed following the standard protocol during bolus administration of intravenous contrast.  CONTRAST:  15mL OMNIPAQUE IOHEXOL 300 MG/ML  SOLN  COMPARISON:  Chest radiograph 08/20/2013, CT abdomen/ pelvis 08/27/2013  FINDINGS: CT CHEST FINDINGS  Great vessels are normal in caliber. Heart size is normal. No pericardial or pleural effusion. No mediastinal or hilar lymphadenopathy. Direct aortic arch origin of the left vertebral artery incidentally noted.  Minimal right apical pleural thickening is identified. Patchy right lower lobe and middle lobe airspace opacity is identified with a reticulonodular configuration, for example image 37. Minimal right lower lobe central bronchial wall thickening is evident. There is also similar but less prominent  reticular nodular left lower lobe consolidation, image 31 suspect image 30.  CT ABDOMEN AND PELVIS FINDINGS  Cholecystectomy clips are noted. Postoperative change after right partial hemicolectomy noted. Anterior abdominal wall stranding is present. No focal fluid collection is seen to suggest abscess formation.  There are numerous dilated loops of small bowel, largest in the left mid abdomen measuring 5.1 cm image 86, for example. There is gradual tapering to the right lower quadrant with approximate transition point again noted in the right lower quadrant subjacent to the right anterolateral abdominal wall, image 98. Distal ileal decompression is identified. Colonic decompression is identified.  Liver, adrenal glands, kidneys, spleen, and pancreas are normal. A few small porta hepatis nodes are identified, largest 0.9 cm image 68, unchanged. No free air. No bowel wall thickening. The bladder is unremarkable. Disc degenerative change noted in the lumbar spine. No focal acute osseous abnormality.  IMPRESSION: New patchy bilateral lower lobe predominant and right middle lobe airspace disease, most compatible with pneumonia especially given the history of fever.  Increased small bowel distention predominantly over the left mid abdomen with tapering at the level of the right lower quadrant anteriorly. This again could represent focal ileus or small-bowel obstruction with approximate transition point reidentified at the right lower quadrant. No evidence for perforation or free air.   Electronically Signed   By: Conchita Paris M.D.   On: 08/31/2013 10:40   Ct Abdomen Pelvis W Contrast  08/31/2013   CLINICAL DATA:  Fever for 2 days. Partial colectomy 08/16/2013. Prior cholecystectomy March 2015.  EXAM: CT CHEST, ABDOMEN, AND PELVIS WITH CONTRAST  TECHNIQUE: Multidetector CT imaging of the chest, abdomen and pelvis was performed following the standard protocol during bolus administration of intravenous contrast.   CONTRAST:  155mL OMNIPAQUE IOHEXOL 300 MG/ML  SOLN  COMPARISON:  Chest radiograph 08/20/2013, CT abdomen/ pelvis 08/27/2013  FINDINGS: CT CHEST FINDINGS  Great vessels are normal in caliber. Heart size is normal. No pericardial or pleural effusion. No mediastinal or hilar lymphadenopathy. Direct aortic arch origin of the left vertebral artery incidentally noted.  Minimal right apical pleural thickening is identified. Patchy right lower lobe and middle lobe airspace opacity is identified with a reticulonodular configuration, for example image 37. Minimal right lower lobe central bronchial wall thickening is evident. There is also similar but less prominent reticular nodular left lower lobe consolidation, image 31 suspect image 30.  CT ABDOMEN AND PELVIS FINDINGS  Cholecystectomy clips are noted. Postoperative change after right partial hemicolectomy noted. Anterior abdominal wall stranding is present. No focal fluid collection is seen to suggest abscess formation.  There are numerous dilated loops of small bowel, largest in the left mid abdomen measuring 5.1 cm image 86, for example. There is gradual tapering to the right lower quadrant  with approximate transition point again noted in the right lower quadrant subjacent to the right anterolateral abdominal wall, image 98. Distal ileal decompression is identified. Colonic decompression is identified.  Liver, adrenal glands, kidneys, spleen, and pancreas are normal. A few small porta hepatis nodes are identified, largest 0.9 cm image 68, unchanged. No free air. No bowel wall thickening. The bladder is unremarkable. Disc degenerative change noted in the lumbar spine. No focal acute osseous abnormality.  IMPRESSION: New patchy bilateral lower lobe predominant and right middle lobe airspace disease, most compatible with pneumonia especially given the history of fever.  Increased small bowel distention predominantly over the left mid abdomen with tapering at the level of  the right lower quadrant anteriorly. This again could represent focal ileus or small-bowel obstruction with approximate transition point reidentified at the right lower quadrant. No evidence for perforation or free air.   Electronically Signed   By: Conchita Paris M.D.   On: 08/31/2013 10:40     Assessment/Plan: 63yo M with post op fever and diarrhea since having partial right coelctomy. Currently on broad spectrum abtx with vancomycin and cefepime from presumed hcap  - continue on vancomycin and cefepime, will switch to oral therapy once he remains afebrile for 48hr - viral conjunctivitis = should be placed on contact isolation for concern that it can easily be passed to caregivers. Will place eye lubricant drops. Ointment not working for him per his report. If it is worsening, with exudate, would do short 5 day trial of tmp-polymixin opht drops 1-2 drops QID - diarrhea = will check gi panel, but cdiff has been ruled out which is the most useful.   Baxter Flattery [facility] for [facility] Cell: [phone] Pager: [phone]  09/01/2013, 3:50 PM

## 2013-09-02 LAB — BASIC METABOLIC PANEL
Anion gap: 17 — ABNORMAL HIGH (ref 5–15)
BUN: 6 mg/dL (ref 6–23)
CHLORIDE: 97 meq/L (ref 96–112)
CO2: 18 meq/L — AB (ref 19–32)
Calcium: 8.6 mg/dL (ref 8.4–10.5)
Creatinine, Ser: 0.77 mg/dL (ref 0.50–1.35)
GFR calc Af Amer: >90 mL/min (ref >90)
GFR calc non Af Amer: >90 mL/min (ref >90)
Glucose, Bld: 152 mg/dL — ABNORMAL HIGH (ref 70–99)
Potassium: 4 mEq/L (ref 3.7–5.3)
SODIUM: 132 meq/L — AB (ref 137–147)

## 2013-09-02 MED ORDER — LEVOFLOXACIN 750 MG PO TABS
750.0000 mg | ORAL_TABLET | ORAL | Status: DC
  Administered 2013-09-02: 750 mg via ORAL
  Filled 2013-09-02 (×2): qty 1

## 2013-09-02 NOTE — Progress Notes (Signed)
Subjective: No complaints.  Objective: Vital signs in last 24 hours: Temp:  [97.6 F (36.4 C)-99.9 F (37.7 C)] 97.6 F (36.4 C) (07/19 0610) Pulse Rate:  [72-88] 72 (07/19 0610) Resp:  [18] 18 (07/19 0610) BP: (115-119)/(63-70) 115/66 mmHg (07/19 0610) SpO2:  [97 %-100 %] 97 % (07/19 0610) Last BM Date: 09/01/13  Intake/Output from previous day: 07/18 0701 - 07/19 0700 In: 2520.8 [P.O.:662; I.V.:558.8; IV Piggyback:1300] Out: -  Intake/Output this shift:    PE: General- In NAD Abdomen-slightly firm and distended, few bowel sounds, incision clean and intact  Lab Results:   Recent Labs  08/31/13 0442 09/01/13 0500  WBC 9.3 7.8  HGB 11.8* 10.9*  HCT 34.2* 30.5*  PLT 178 186   BMET  Recent Labs  09/01/13 0500 09/02/13 0620  NA 126* 132*  K 3.1* 4.0  CL 91* 97  CO2 20 18*  GLUCOSE 147* 152*  BUN 4* 6  CREATININE 0.89 0.77  CALCIUM 8.2* 8.6   PT/INR No results found for this basename: LABPROT, INR,  in the last 72 hours Comprehensive Metabolic Panel:    Component Value Date/Time   NA 132* 09/02/2013 0620   NA 126* 09/01/2013 0500   K 4.0 09/02/2013 0620   K 3.1* 09/01/2013 0500   CL 97 09/02/2013 0620   CL 91* 09/01/2013 0500   CO2 18* 09/02/2013 0620   CO2 20 09/01/2013 0500   BUN 6 09/02/2013 0620   BUN 4* 09/01/2013 0500   CREATININE 0.77 09/02/2013 0620   CREATININE 0.89 09/01/2013 0500   GLUCOSE 152* 09/02/2013 0620   GLUCOSE 147* 09/01/2013 0500   CALCIUM 8.6 09/02/2013 0620   CALCIUM 8.2* 09/01/2013 0500   AST 29 09/01/2013 0500   AST 33 08/31/2013 0029   ALT 53 09/01/2013 0500   ALT 54* 08/31/2013 0029   ALKPHOS 66 09/01/2013 0500   ALKPHOS 75 08/31/2013 0029   BILITOT 0.5 09/01/2013 0500   BILITOT 0.6 08/31/2013 0029   PROT 6.1 09/01/2013 0500   PROT 6.5 08/31/2013 0029   ALBUMIN 2.8* 09/01/2013 0500   ALBUMIN 3.1* 08/31/2013 0029     Studies/Results: Ct Chest W Contrast  08/31/2013   CLINICAL DATA:  Fever for 2 days. Partial colectomy 08/16/2013.  Prior cholecystectomy March 2015.  EXAM: CT CHEST, ABDOMEN, AND PELVIS WITH CONTRAST  TECHNIQUE: Multidetector CT imaging of the chest, abdomen and pelvis was performed following the standard protocol during bolus administration of intravenous contrast.  CONTRAST:  166mL OMNIPAQUE IOHEXOL 300 MG/ML  SOLN  COMPARISON:  Chest radiograph 08/20/2013, CT abdomen/ pelvis 08/27/2013  FINDINGS: CT CHEST FINDINGS  Great vessels are normal in caliber. Heart size is normal. No pericardial or pleural effusion. No mediastinal or hilar lymphadenopathy. Direct aortic arch origin of the left vertebral artery incidentally noted.  Minimal right apical pleural thickening is identified. Patchy right lower lobe and middle lobe airspace opacity is identified with a reticulonodular configuration, for example image 37. Minimal right lower lobe central bronchial wall thickening is evident. There is also similar but less prominent reticular nodular left lower lobe consolidation, image 31 suspect image 30.  CT ABDOMEN AND PELVIS FINDINGS  Cholecystectomy clips are noted. Postoperative change after right partial hemicolectomy noted. Anterior abdominal wall stranding is present. No focal fluid collection is seen to suggest abscess formation.  There are numerous dilated loops of small bowel, largest in the left mid abdomen measuring 5.1 cm image 86, for example. There is gradual tapering to the right lower quadrant  with approximate transition point again noted in the right lower quadrant subjacent to the right anterolateral abdominal wall, image 98. Distal ileal decompression is identified. Colonic decompression is identified.  Liver, adrenal glands, kidneys, spleen, and pancreas are normal. A few small porta hepatis nodes are identified, largest 0.9 cm image 68, unchanged. No free air. No bowel wall thickening. The bladder is unremarkable. Disc degenerative change noted in the lumbar spine. No focal acute osseous abnormality.  IMPRESSION: New  patchy bilateral lower lobe predominant and right middle lobe airspace disease, most compatible with pneumonia especially given the history of fever.  Increased small bowel distention predominantly over the left mid abdomen with tapering at the level of the right lower quadrant anteriorly. This again could represent focal ileus or small-bowel obstruction with approximate transition point reidentified at the right lower quadrant. No evidence for perforation or free air.   Electronically Signed   By: Conchita Paris M.D.   On: 08/31/2013 10:40   Ct Abdomen Pelvis W Contrast  08/31/2013   CLINICAL DATA:  Fever for 2 days. Partial colectomy 08/16/2013. Prior cholecystectomy March 2015.  EXAM: CT CHEST, ABDOMEN, AND PELVIS WITH CONTRAST  TECHNIQUE: Multidetector CT imaging of the chest, abdomen and pelvis was performed following the standard protocol during bolus administration of intravenous contrast.  CONTRAST:  189mL OMNIPAQUE IOHEXOL 300 MG/ML  SOLN  COMPARISON:  Chest radiograph 08/20/2013, CT abdomen/ pelvis 08/27/2013  FINDINGS: CT CHEST FINDINGS  Great vessels are normal in caliber. Heart size is normal. No pericardial or pleural effusion. No mediastinal or hilar lymphadenopathy. Direct aortic arch origin of the left vertebral artery incidentally noted.  Minimal right apical pleural thickening is identified. Patchy right lower lobe and middle lobe airspace opacity is identified with a reticulonodular configuration, for example image 37. Minimal right lower lobe central bronchial wall thickening is evident. There is also similar but less prominent reticular nodular left lower lobe consolidation, image 31 suspect image 30.  CT ABDOMEN AND PELVIS FINDINGS  Cholecystectomy clips are noted. Postoperative change after right partial hemicolectomy noted. Anterior abdominal wall stranding is present. No focal fluid collection is seen to suggest abscess formation.  There are numerous dilated loops of small bowel,  largest in the left mid abdomen measuring 5.1 cm image 86, for example. There is gradual tapering to the right lower quadrant with approximate transition point again noted in the right lower quadrant subjacent to the right anterolateral abdominal wall, image 98. Distal ileal decompression is identified. Colonic decompression is identified.  Liver, adrenal glands, kidneys, spleen, and pancreas are normal. A few small porta hepatis nodes are identified, largest 0.9 cm image 68, unchanged. No free air. No bowel wall thickening. The bladder is unremarkable. Disc degenerative change noted in the lumbar spine. No focal acute osseous abnormality.  IMPRESSION: New patchy bilateral lower lobe predominant and right middle lobe airspace disease, most compatible with pneumonia especially given the history of fever.  Increased small bowel distention predominantly over the left mid abdomen with tapering at the level of the right lower quadrant anteriorly. This again could represent focal ileus or small-bowel obstruction with approximate transition point reidentified at the right lower quadrant. No evidence for perforation or free air.   Electronically Signed   By: Conchita Paris M.D.   On: 08/31/2013 10:40    Anti-infectives: Anti-infectives   Start     Dose/Rate Route Frequency Ordered Stop   08/31/13 2200  ceFEPIme (MAXIPIME) 2 g in dextrose 5 % 50 mL IVPB  2 g 100 mL/hr over 30 Minutes Intravenous Every 12 hours 08/31/13 1648     08/30/13 2200  piperacillin-tazobactam (ZOSYN) IVPB 3.375 g  Status:  Discontinued    Comments:  Pharmacy to check dosing   3.375 g 12.5 mL/hr over 240 Minutes Intravenous 3 times per day 08/30/13 1910 08/31/13 1648   08/30/13 2030  vancomycin (VANCOCIN) IVPB 1000 mg/200 mL premix     1,000 mg 200 mL/hr over 60 Minutes Intravenous Every 8 hours 08/30/13 1910     08/30/13 1000  doxycycline (VIBRA-TABS) tablet 100 mg  Status:  Discontinued     100 mg Oral Every 12 hours 08/30/13  0709 08/30/13 1910   08/28/13 1000  amoxicillin-clavulanate (AUGMENTIN) 875-125 MG per tablet 1 tablet  Status:  Discontinued     1 tablet Oral Every 12 hours 08/28/13 0704 08/30/13 0709      Assessment Right colectomy 08/16/13-has prolonged postop ileus which has resolved; no abscess on 7/17 CT Pneumonia and fever-ID involved; abxs changed yesterday. Low grade fever last night. Hyponatremia-improved Hypokalemia-resolved Etoh use-On CIWA protocol Possible conjunctivitis per ID-started on eye drops   LOS: 10 days   Plan: Home on oral antibiotics when afebrile for 24 hours per ID   Sreeja Spies J 09/02/2013

## 2013-09-02 NOTE — Progress Notes (Addendum)
[place] for Infectious Disease    Date of Admission:  08/23/2013   Total days of antibiotics 6        Day 3 cefepime        Day 4 vanco           ID: Zachary Kim is a 63 y.o. male with   Principal Problem:   Postoperative fever Active Problems:   Postoperative ileus   Tubulovillous adenoma of colon   S/P partial colectomy   Normocytic anemia   Hyperglycemia   Hyponatremia    Subjective: Afebrile today. tmax of 99.9 yesterday. Dry cough  Medications:  . bacitracin-polymyxin b (ophth)   Both Eyes 6 times per day  . calcium carbonate  1 tablet Oral BID  . ceFEPime (MAXIPIME) IV  2 g Intravenous Q12H  . enoxaparin (LOVENOX) injection  40 mg Subcutaneous Q24H  . feeding supplement (ENSURE COMPLETE)  237 mL Oral BID BM  . folic acid  1 mg Oral Daily  . multivitamin with minerals  1 tablet Oral Daily  . pantoprazole  40 mg Oral Daily  . thiamine  100 mg Oral Daily   Or  . thiamine  100 mg Intravenous Daily  . trimethoprim-polymyxin b  2 drop Both Eyes 4 times per day    Objective: Vital signs in last 24 hours: Temp:  [97.6 F (36.4 C)-99.9 F (37.7 C)] 97.6 F (36.4 C) (07/19 0610) Pulse Rate:  [72-88] 72 (07/19 0610) Resp:  [18] 18 (07/19 0610) BP: (115-119)/(63-70) 115/66 mmHg (07/19 0610) SpO2:  [97 %-100 %] 97 % (07/19 0610)  Physical Exam  Constitutional: He is oriented to person, place, and time. He appears well-developed and well-nourished. No distress.  HENT: bilateral conjunctivitis Mouth/Throat: Oropharynx is clear and moist. No oropharyngeal exudate.  Cardiovascular: Normal rate, regular rhythm and normal heart sounds. Exam reveals no gallop and no friction rub.  No murmur heard.  Pulmonary/Chest: Effort normal and breath sounds normal. No respiratory distress. He has no wheezes.  Abdominal: Soft. Bowel sounds are normal. He exhibits no distension. There is no tenderness.  Lymphadenopathy:  He has no cervical adenopathy.  Neurological: He  is alert and oriented to person, place, and time.  Skin: Skin is warm and dry. No rash noted. No erythema.  Psychiatric: He has a normal mood and affect. His behavior is normal.    Lab Results  Recent Labs  08/31/13 0442 09/01/13 0500 09/02/13 0620  WBC 9.3 7.8  --   HGB 11.8* 10.9*  --   HCT 34.2* 30.5*  --   NA  --  126* 132*  K  --  3.1* 4.0  CL  --  91* 97  CO2  --  20 18*  BUN  --  4* 6  CREATININE  --  0.89 0.77   Liver Panel  Recent Labs  08/31/13 0029 09/01/13 0500  PROT 6.5 6.1  ALBUMIN 3.1* 2.8*  AST 33 29  ALT 54* 53  ALKPHOS 75 66  BILITOT 0.6 0.5   Sedimentation Rate No results found for this basename: ESRSEDRATE,  in the last 72 hours C-Reactive Protein  Recent Labs  08/30/13 2035  CRP 10.7*    Microbiology: 7/16 blood cx ngtd 7/16 cdiff negative Studies/Results: No results found.   Assessment/Plan: 63yo M with post op fever and diarrhea since having partial right coelctomy. Currently on broad spectrum abtx with vancomycin and cefepime from presumed hcap. Steadily improving  - will plan to  switch to levofloxacin today to finish out course of presumed hcap to finish out 7 day course of therapy. He will need 3 more doses if discharged on 7/20. - viral conjunctivitis = should be placed on contact isolation for concern that it can easily be passed to caregivers. Will place eye lubricant drops. Ointment not working for him per his report. If it is worsening, with exudate, would do short 5 day trial of tmp-polymixin opht drops 1-2 drops QID - diarrhea = will check gi panel, but cdiff has been ruled out which is the most useful.   Will sign off. Call if question  Baxter Flattery [facility] for Infectious Diseases Cell: [phone] Pager: [phone]  09/02/2013, 12:27 PM

## 2013-09-03 LAB — GI PATHOGEN PANEL BY PCR, STOOL
C DIFFICILE TOXIN A/B: NEGATIVE
Campylobacter by PCR: NEGATIVE
Cryptosporidium by PCR: NEGATIVE
E COLI 0157 BY PCR: NEGATIVE
E coli (ETEC) LT/ST: NEGATIVE
E coli (STEC): NEGATIVE
G lamblia by PCR: NEGATIVE
Norovirus GI/GII: NEGATIVE
ROTAVIRUS A BY PCR: NEGATIVE
SALMONELLA BY PCR: NEGATIVE
Shigella by PCR: NEGATIVE

## 2013-09-03 MED ORDER — POLYMYXIN B-TRIMETHOPRIM 10000-0.1 UNIT/ML-% OP SOLN: 2.0000 [drp] | Freq: Four times a day (QID) | OPHTHALMIC | Status: DC

## 2013-09-03 MED ORDER — PROMETHAZINE HCL 12.5 MG PO TABS: 12.5000 mg | ORAL_TABLET | Freq: Four times a day (QID) | ORAL | Status: DC | PRN

## 2013-09-03 MED ORDER — LEVOFLOXACIN 750 MG PO TABS: 750.0000 mg | ORAL_TABLET | ORAL | Status: DC

## 2013-09-03 NOTE — Progress Notes (Signed)
  Subjective: EYES BETTER MOVING BOWELS WANTS TO GO HOME  Objective: Vital signs in last 24 hours: Temp:  [97.9 F (36.6 C)-98.1 F (36.7 C)] 97.9 F (36.6 C) (07/20 0524) Pulse Rate:  [78-83] 83 (07/20 0524) Resp:  [18-20] 20 (07/20 0524) BP: (124-131)/(67-71) 125/67 mmHg (07/20 0524) SpO2:  [98 %-100 %] 98 % (07/20 0524) Last BM Date: 09/02/13  Intake/Output from previous day: 07/19 0701 - 07/20 0700 In: 1080 [P.O.:1080] Out: -  Intake/Output this shift:    Resp: clear to auscultation bilaterally Incision/Wound:c/d/i MUCH SOFTER  Lab Results:   Recent Labs  09/01/13 0500  WBC 7.8  HGB 10.9*  HCT 30.5*  PLT 186   BMET  Recent Labs  09/01/13 0500 09/02/13 0620  NA 126* 132*  K 3.1* 4.0  CL 91* 97  CO2 20 18*  GLUCOSE 147* 152*  BUN 4* 6  CREATININE 0.89 0.77  CALCIUM 8.2* 8.6   PT/INR No results found for this basename: LABPROT, INR,  in the last 72 hours ABG No results found for this basename: PHART, PCO2, PO2, HCO3,  in the last 72 hours  Studies/Results: No results found.  Anti-infectives: Anti-infectives   Start     Dose/Rate Route Frequency Ordered Stop   09/02/13 1700  levofloxacin (LEVAQUIN) tablet 750 mg     750 mg Oral Every 24 hours 09/02/13 1633 09/06/13 1659   08/31/13 2200  ceFEPIme (MAXIPIME) 2 g in dextrose 5 % 50 mL IVPB  Status:  Discontinued     2 g 100 mL/hr over 30 Minutes Intravenous Every 12 hours 08/31/13 1648 09/02/13 1633   08/30/13 2200  piperacillin-tazobactam (ZOSYN) IVPB 3.375 g  Status:  Discontinued    Comments:  Pharmacy to check dosing   3.375 g 12.5 mL/hr over 240 Minutes Intravenous 3 times per day 08/30/13 1910 08/31/13 1648   08/30/13 2030  vancomycin (VANCOCIN) IVPB 1000 mg/200 mL premix  Status:  Discontinued     1,000 mg 200 mL/hr over 60 Minutes Intravenous Every 8 hours 08/30/13 1910 09/02/13 1227   08/30/13 1000  doxycycline (VIBRA-TABS) tablet 100 mg  Status:  Discontinued     100 mg Oral Every 12  hours 08/30/13 0709 08/30/13 1910   08/28/13 1000  amoxicillin-clavulanate (AUGMENTIN) 875-125 MG per tablet 1 tablet  Status:  Discontinued     1 tablet Oral Every 12 hours 08/28/13 0704 08/30/13 7106      Assessment/Plan: Patient Active Problem List   Diagnosis Date Noted  . Tubulovillous adenoma of colon 08/31/2013  . S/P partial colectomy 08/31/2013  . Postoperative fever 08/31/2013  . Normocytic anemia 08/31/2013  . Hyperglycemia 08/31/2013  . Hyponatremia 08/31/2013  . Postoperative ileus 08/23/2013  . Cecum mass 07/20/2013  . Mass of appendix 06/29/2013  . Post-operative state 05/14/2013  . S/P cholecystectomy March 20125 04/29/2013  Fever gone moving bowel  Discharge home HCPAP improved   3 more days of ABX  LOS: 11 days    Zachary Kim A. 09/03/2013

## 2013-09-03 NOTE — Discharge Planning (Signed)
Patient discharged home in stable condition. Verbalizes understanding of all discharge instructions, including home medications and follow up appointments. 

## 2013-09-03 NOTE — Discharge Summary (Signed)
Physician Discharge Summary  Patient ID: Zachary Kim MRN: [id number] DOB/AGE: 06/11/1950 63 y.o.  Admit date: 08/23/2013 Discharge date: 09/03/2013  Admission Diagnoses: ILEUS POST PARTIAL COLECTOMY Patient Active Problem List   Diagnosis Date Noted  . Tubulovillous adenoma of colon 08/31/2013  . S/P partial colectomy 08/31/2013  . Postoperative fever 08/31/2013  . Normocytic anemia 08/31/2013  . Hyperglycemia 08/31/2013  . Hyponatremia 08/31/2013  . Postoperative ileus 08/23/2013  . Cecum mass 07/20/2013  . Mass of appendix 06/29/2013  . Post-operative state 05/14/2013  . S/P cholecystectomy March 20125 04/29/2013    Discharge Diagnoses:  Principal Problem:   Postoperative fever Active Problems:   Postoperative ileus   Tubulovillous adenoma of colon   S/P partial colectomy   Normocytic anemia   Hyperglycemia   Hyponatremia HCAP  Discharged Condition: good  Hospital Course: PT READMITTED 8 DAYS AFTER PARTIAL COLECTOMY WITH ILEUS.  PT DEVELOPED PNEUMONIA AND POST OP FEVER FROM THIS AND HAD SLOW RESOLUTION OF ILEUS REQUIRING TNA AND IV ABX.  CT SCANS REVEALED NO COMPLICATING INTRAABDOMINAL ISSUES.  HE ALSO HAD ELECTROLYTE ABNORMALITIES CORRECTED AND VIRAL CONJUNCTIVITIS THAT RESOLVED.  ID CONSULT OBTAINED DUE TO FEVER.  LEVOQUIN STARTED AND FEVER RESOLED.  D/C ON DAY 12 IN GOOD CONDITION.   Consults: ID  Significant Diagnostic Studies: CT A/P AND CHEST    CBC    Component Value Date/Time   WBC 7.8 09/01/2013 0500   RBC 3.37* 09/01/2013 0500   HGB 10.9* 09/01/2013 0500   HCT 30.5* 09/01/2013 0500   PLT 186 09/01/2013 0500   MCV 90.5 09/01/2013 0500   MCH 32.3 09/01/2013 0500   MCHC 35.7 09/01/2013 0500   RDW 13.7 09/01/2013 0500   LYMPHSABS 1.1 08/27/2013 0500   MONOABS 1.1* 08/27/2013 0500   EOSABS 0.2 08/27/2013 0500   BASOSABS 0.1 08/27/2013 0500   CMP     Component Value Date/Time   NA 132* 09/02/2013 0620   K 4.0 09/02/2013 0620   CL 97 09/02/2013 0620   CO2 18*  09/02/2013 0620   GLUCOSE 152* 09/02/2013 0620   BUN 6 09/02/2013 0620   CREATININE 0.77 09/02/2013 0620   CALCIUM 8.6 09/02/2013 0620   PROT 6.1 09/01/2013 0500   ALBUMIN 2.8* 09/01/2013 0500   AST 29 09/01/2013 0500   ALT 53 09/01/2013 0500   ALKPHOS 66 09/01/2013 0500   BILITOT 0.5 09/01/2013 0500   GFRNONAA >90 09/02/2013 0620   GFRAA >90 09/02/2013 0620     Treatments: IV hydration, antibiotics: vancomycin, Zosyn and Levaquin and TPN  Discharge Exam: Blood pressure 125/67, pulse 83, temperature 97.9 F (36.6 C), temperature source Oral, resp. rate 20, height 6\' 2"  (1.88 m), weight 219 lb 12.8 oz (99.7 kg), SpO2 98.00%. Incision/Wound:CDI SOFT LESS DISTENDED. EYES NORMAL LUNGS CTA  Disposition: 01-Home or Self Care  Discharge Instructions   Increase activity slowly    Complete by:  As directed             Medication List    STOP taking these medications       polyethylene glycol packet  Commonly known as:  MIRALAX / GLYCOLAX      TAKE these medications       ascorbic acid 500 MG tablet  Commonly known as:  VITAMIN C  Take 500 mg by mouth daily.     CENTRUM SILVER PO  Take 1 tablet by mouth daily.     cholecalciferol 1000 UNITS tablet  Commonly known as:  VITAMIN D  Take 1,000 Units by mouth daily.     ibuprofen 200 MG tablet  Commonly known as:  ADVIL,MOTRIN  Take 200 mg by mouth daily as needed for mild pain.     levofloxacin 750 MG tablet  Commonly known as:  LEVAQUIN  Take 1 tablet (750 mg total) by mouth daily.     oxyCODONE-acetaminophen 5-325 MG per tablet  Commonly known as:  PERCOCET/ROXICET  Take 1-2 tablets by mouth every 4 (four) hours as needed for severe pain.     pantoprazole 40 MG tablet  Commonly known as:  PROTONIX  Take 1 tablet (40 mg total) by mouth daily.     promethazine 12.5 MG tablet  Commonly known as:  PHENERGAN  Take 1 tablet (12.5 mg total) by mouth every 6 (six) hours as needed for nausea or vomiting.     PROVENTIL HFA 108  (90 BASE) MCG/ACT inhaler  Generic drug:  albuterol  Inhale 1 puff into the lungs every 6 (six) hours as needed for wheezing or shortness of breath.     trimethoprim-polymyxin b ophthalmic solution  Commonly known as:  POLYTRIM  Place 2 drops into both eyes every 6 (six) hours.           Follow-up Information   Follow up with Atreus Hasz A., MD. Schedule an appointment as soon as possible for a visit in 3 weeks.   Specialty:  General Surgery   Contact information:   [address] [place] [address] [phone]       Signed: Erroll Luna A. 09/03/2013, 9:02 AM

## 2013-09-03 NOTE — Progress Notes (Signed)
Per MD order, PICC line removed. Cath intact at 43cm. Vaseline pressure gauze to site, pressure held x 5min. No bleeding to site. Pt instructed to keep dressing CDI x 24 hours. Avoid heavy lifting, pushing or pulling x 24 hours,  If bleeding occurs hold pressure, if bleeding does not stop contact MD or go to the ED. Pt does not have any questions. Zachary Kim M  

## 2013-09-03 NOTE — Discharge Instructions (Signed)
CAN DRIVE IN 1 WEEK.  NO LIFTING MORE THAN 15 LBS FOR 3 WEEKS.  SOFT DIET.  OK TO SHOWEER

## 2013-09-06 LAB — CULTURE, BLOOD (ROUTINE X 2)
Culture: NO GROWTH
Culture: NO GROWTH

## 2013-09-13 ENCOUNTER — Emergency Department (HOSPITAL_COMMUNITY): Payer: BC Managed Care – PPO

## 2013-09-13 ENCOUNTER — Encounter (HOSPITAL_COMMUNITY): Payer: Self-pay | Admitting: Emergency Medicine

## 2013-09-13 ENCOUNTER — Telehealth (INDEPENDENT_AMBULATORY_CARE_PROVIDER_SITE_OTHER): Payer: Self-pay

## 2013-09-13 ENCOUNTER — Observation Stay (HOSPITAL_COMMUNITY)
Admission: EM | Admit: 2013-09-13 | Discharge: 2013-09-15 | Disposition: A | Discharge status: Home / Self Care | Payer: BC Managed Care – PPO | Attending: General Surgery | Admitting: General Surgery

## 2013-09-13 DIAGNOSIS — R110 Nausea: Secondary | ICD-10-CM

## 2013-09-13 DIAGNOSIS — J45909 Unspecified asthma, uncomplicated: Secondary | ICD-10-CM | POA: Insufficient documentation

## 2013-09-13 DIAGNOSIS — E871 Hypo-osmolality and hyponatremia: Secondary | ICD-10-CM | POA: Diagnosis not present

## 2013-09-13 DIAGNOSIS — Z9089 Acquired absence of other organs: Secondary | ICD-10-CM | POA: Insufficient documentation

## 2013-09-13 DIAGNOSIS — R630 Anorexia: Secondary | ICD-10-CM | POA: Insufficient documentation

## 2013-09-13 DIAGNOSIS — H9190 Unspecified hearing loss, unspecified ear: Secondary | ICD-10-CM | POA: Insufficient documentation

## 2013-09-13 DIAGNOSIS — D696 Thrombocytopenia, unspecified: Secondary | ICD-10-CM | POA: Diagnosis not present

## 2013-09-13 DIAGNOSIS — F4320 Adjustment disorder, unspecified: Secondary | ICD-10-CM | POA: Diagnosis not present

## 2013-09-13 DIAGNOSIS — Z9049 Acquired absence of other specified parts of digestive tract: Secondary | ICD-10-CM | POA: Insufficient documentation

## 2013-09-13 DIAGNOSIS — M129 Arthropathy, unspecified: Secondary | ICD-10-CM | POA: Insufficient documentation

## 2013-09-13 DIAGNOSIS — K449 Diaphragmatic hernia without obstruction or gangrene: Secondary | ICD-10-CM | POA: Insufficient documentation

## 2013-09-13 DIAGNOSIS — R112 Nausea with vomiting, unspecified: Secondary | ICD-10-CM | POA: Diagnosis not present

## 2013-09-13 DIAGNOSIS — D126 Benign neoplasm of colon, unspecified: Secondary | ICD-10-CM | POA: Diagnosis not present

## 2013-09-13 DIAGNOSIS — Z8701 Personal history of pneumonia (recurrent): Secondary | ICD-10-CM | POA: Insufficient documentation

## 2013-09-13 DIAGNOSIS — R5381 Other malaise: Secondary | ICD-10-CM

## 2013-09-13 DIAGNOSIS — R627 Adult failure to thrive: Secondary | ICD-10-CM | POA: Diagnosis not present

## 2013-09-13 DIAGNOSIS — E785 Hyperlipidemia, unspecified: Secondary | ICD-10-CM | POA: Insufficient documentation

## 2013-09-13 DIAGNOSIS — D649 Anemia, unspecified: Secondary | ICD-10-CM | POA: Diagnosis not present

## 2013-09-13 DIAGNOSIS — Z87891 Personal history of nicotine dependence: Secondary | ICD-10-CM | POA: Insufficient documentation

## 2013-09-13 DIAGNOSIS — R197 Diarrhea, unspecified: Secondary | ICD-10-CM | POA: Insufficient documentation

## 2013-09-13 DIAGNOSIS — E876 Hypokalemia: Secondary | ICD-10-CM | POA: Diagnosis not present

## 2013-09-13 DIAGNOSIS — K219 Gastro-esophageal reflux disease without esophagitis: Secondary | ICD-10-CM | POA: Insufficient documentation

## 2013-09-13 DIAGNOSIS — Z79899 Other long term (current) drug therapy: Secondary | ICD-10-CM | POA: Diagnosis not present

## 2013-09-13 DIAGNOSIS — R61 Generalized hyperhidrosis: Secondary | ICD-10-CM | POA: Diagnosis not present

## 2013-09-13 DIAGNOSIS — R739 Hyperglycemia, unspecified: Secondary | ICD-10-CM | POA: Diagnosis present

## 2013-09-13 DIAGNOSIS — R509 Fever, unspecified: Principal | ICD-10-CM | POA: Insufficient documentation

## 2013-09-13 DIAGNOSIS — R5383 Other fatigue: Secondary | ICD-10-CM

## 2013-09-13 LAB — CBC WITH DIFFERENTIAL/PLATELET
BASOS ABS: 0.2 10*3/uL — AB (ref 0.0–0.1)
Basophils Relative: 2 % — ABNORMAL HIGH (ref 0–1)
EOS PCT: 1 % (ref 0–5)
Eosinophils Absolute: 0.1 10*3/uL (ref 0.0–0.7)
HCT: 35.6 % — ABNORMAL LOW (ref 39.0–52.0)
Hemoglobin: 12.3 g/dL — ABNORMAL LOW (ref 13.0–17.0)
LYMPHS ABS: 3.8 10*3/uL (ref 0.7–4.0)
LYMPHS PCT: 35 % (ref 12–46)
MCH: 31.9 pg (ref 26.0–34.0)
MCHC: 34.6 g/dL (ref 30.0–36.0)
MCV: 92.2 fL (ref 78.0–100.0)
MONOS PCT: 9 % (ref 3–12)
Monocytes Absolute: 1 10*3/uL (ref 0.1–1.0)
Neutro Abs: 5.7 10*3/uL (ref 1.7–7.7)
Neutrophils Relative %: 53 % (ref 43–77)
PLATELETS: 128 10*3/uL — AB (ref 150–400)
RBC: 3.86 MIL/uL — AB (ref 4.22–5.81)
RDW: 14.8 % (ref 11.5–15.5)
WBC: 10.8 10*3/uL — AB (ref 4.0–10.5)

## 2013-09-13 LAB — COMPREHENSIVE METABOLIC PANEL
ALT: 33 U/L (ref 0–53)
ANION GAP: 16 — AB (ref 5–15)
AST: 31 U/L (ref 0–37)
Albumin: 3 g/dL — ABNORMAL LOW (ref 3.5–5.2)
Alkaline Phosphatase: 78 U/L (ref 39–117)
BUN: 7 mg/dL (ref 6–23)
CO2: 21 mEq/L (ref 19–32)
Calcium: 8.3 mg/dL — ABNORMAL LOW (ref 8.4–10.5)
Chloride: 94 mEq/L — ABNORMAL LOW (ref 96–112)
Creatinine, Ser: 0.85 mg/dL (ref 0.50–1.35)
GFR calc Af Amer: >90 mL/min (ref >90)
GFR calc non Af Amer: >90 mL/min (ref >90)
Glucose, Bld: 159 mg/dL — ABNORMAL HIGH (ref 70–99)
Potassium: 3.9 mEq/L (ref 3.7–5.3)
SODIUM: 131 meq/L — AB (ref 137–147)
TOTAL PROTEIN: 6.5 g/dL (ref 6.0–8.3)
Total Bilirubin: 0.7 mg/dL (ref 0.3–1.2)

## 2013-09-13 LAB — URINALYSIS, ROUTINE W REFLEX MICROSCOPIC
BILIRUBIN URINE: NEGATIVE
GLUCOSE, UA: NEGATIVE mg/dL
HGB URINE DIPSTICK: NEGATIVE
KETONES UR: NEGATIVE mg/dL
Leukocytes, UA: NEGATIVE
Nitrite: NEGATIVE
PH: 6 (ref 5.0–8.0)
Protein, ur: 30 mg/dL — AB
SPECIFIC GRAVITY, URINE: 1.021 (ref 1.005–1.030)
Urobilinogen, UA: 0.2 mg/dL (ref 0.0–1.0)

## 2013-09-13 LAB — URINE MICROSCOPIC-ADD ON

## 2013-09-13 LAB — CLOSTRIDIUM DIFFICILE BY PCR: Toxigenic C. Difficile by PCR: NEGATIVE

## 2013-09-13 LAB — I-STAT CG4 LACTIC ACID, ED: Lactic Acid, Venous: 2.57 mmol/L — ABNORMAL HIGH (ref 0.5–2.2)

## 2013-09-13 MED ORDER — PANTOPRAZOLE SODIUM 40 MG PO TBEC
40.0000 mg | DELAYED_RELEASE_TABLET | Freq: Every day | ORAL | Status: DC
  Administered 2013-09-14: 40 mg via ORAL
  Filled 2013-09-13: qty 1

## 2013-09-13 MED ORDER — IOHEXOL 300 MG/ML  SOLN
25.0000 mL | INTRAMUSCULAR | Status: DC
  Administered 2013-09-13: 25 mL via ORAL

## 2013-09-13 MED ORDER — POTASSIUM CHLORIDE IN NACL 20-0.9 MEQ/L-% IV SOLN
INTRAVENOUS | Status: DC
  Administered 2013-09-13 – 2013-09-14 (×4): via INTRAVENOUS
  Filled 2013-09-13 (×10): qty 1000

## 2013-09-13 MED ORDER — ACETAMINOPHEN 325 MG PO TABS
650.0000 mg | ORAL_TABLET | Freq: Once | ORAL | Status: AC
  Administered 2013-09-13: 650 mg via ORAL
  Filled 2013-09-13: qty 2

## 2013-09-13 MED ORDER — PROMETHAZINE HCL 25 MG/ML IJ SOLN
12.5000 mg | Freq: Four times a day (QID) | INTRAMUSCULAR | Status: DC | PRN
  Administered 2013-09-14: 12.5 mg via INTRAVENOUS
  Filled 2013-09-13: qty 1

## 2013-09-13 MED ORDER — POLYMYXIN B-TRIMETHOPRIM 10000-0.1 UNIT/ML-% OP SOLN
2.0000 [drp] | Freq: Four times a day (QID) | OPHTHALMIC | Status: DC
  Administered 2013-09-13 – 2013-09-14 (×3): 2 [drp] via OPHTHALMIC
  Filled 2013-09-13: qty 10

## 2013-09-13 MED ORDER — MORPHINE SULFATE 2 MG/ML IJ SOLN: 1.0000 mg | INTRAMUSCULAR | Status: DC | PRN

## 2013-09-13 MED ORDER — ONDANSETRON HCL 4 MG/2ML IJ SOLN
4.0000 mg | Freq: Four times a day (QID) | INTRAMUSCULAR | Status: DC | PRN
  Administered 2013-09-13: 4 mg via INTRAVENOUS
  Filled 2013-09-13: qty 2

## 2013-09-13 MED ORDER — OXYCODONE-ACETAMINOPHEN 5-325 MG PO TABS
1.0000 | ORAL_TABLET | ORAL | Status: DC | PRN
  Administered 2013-09-14 – 2013-09-15 (×3): 2 via ORAL
  Filled 2013-09-13 (×3): qty 2

## 2013-09-13 MED ORDER — ZOLPIDEM TARTRATE 5 MG PO TABS
5.0000 mg | ORAL_TABLET | Freq: Once | ORAL | Status: AC
  Administered 2013-09-13: 5 mg via ORAL
  Filled 2013-09-13: qty 1

## 2013-09-13 MED ORDER — SODIUM CHLORIDE 0.9 % IV BOLUS (SEPSIS)
1000.0000 mL | Freq: Once | INTRAVENOUS | Status: AC
  Administered 2013-09-13: 1000 mL via INTRAVENOUS

## 2013-09-13 MED ORDER — ONDANSETRON HCL 4 MG/2ML IJ SOLN
4.0000 mg | Freq: Four times a day (QID) | INTRAMUSCULAR | Status: DC | PRN
Start: 2013-09-13 — End: 2013-09-14
  Administered 2013-09-14: 4 mg via INTRAVENOUS
  Filled 2013-09-13: qty 2

## 2013-09-13 MED ORDER — ENOXAPARIN SODIUM 40 MG/0.4ML ~~LOC~~ SOLN
40.0000 mg | SUBCUTANEOUS | Status: DC
  Administered 2013-09-13 – 2013-09-14 (×2): 40 mg via SUBCUTANEOUS
  Filled 2013-09-13 (×3): qty 0.4

## 2013-09-13 MED ORDER — ALBUTEROL SULFATE (2.5 MG/3ML) 0.083% IN NEBU: 3.0000 mL | INHALATION_SOLUTION | Freq: Four times a day (QID) | RESPIRATORY_TRACT | Status: DC | PRN

## 2013-09-13 MED ORDER — IOHEXOL 300 MG/ML  SOLN
100.0000 mL | Freq: Once | INTRAMUSCULAR | Status: AC | PRN
  Administered 2013-09-13: 100 mL via INTRAVENOUS

## 2013-09-13 NOTE — Consult Note (Signed)
Unsure of what is going on.  Abdominal examination is not very impressive, although he is moderately distended but soft.  He has good bowel sounds and is not very tender.  Clostridium difficile toxin titer is negative.  Will get CT scan of the abdomen and pelvic soon.  Kathryne Eriksson. Dahlia Bailiff, MD, [place] [phone] [facility] Surgery

## 2013-09-13 NOTE — ED Notes (Signed)
Attempted to call report, nurse unavailable, advised she would call back shortly.

## 2013-09-13 NOTE — Telephone Encounter (Signed)
Pts wife called stating pt still running temps at night 100.7.  Pt having nausea,not eating,loose bm x 8 a day. Pt has cough but not productive. Pts wife states pt is very pale and not improving since d/c. Reviewed with Dr Excell Seltzer. Per Dr Lear Ng request pt directed to go to ER to have labs,cxr and be evaluated. I advised wife and she will go back to [facility] ER.

## 2013-09-13 NOTE — ED Notes (Signed)
CT called to inform that patient is finished with oral contrast.

## 2013-09-13 NOTE — Progress Notes (Signed)
Called by EDP regarding CT scan results. Patient has fever in the emergency room as recently as 5 PM with a temperature 101.1. Although no sign of extravasation of contrast on CT or intra-abdominal abscess and essentially resolved pneumonia on CT, I believe the safest thing for the patient is to come in overnight for fluids. I am reluctant to start empiric antibiotics on him given his negative CT scan. He does have a mild case of dehydration given the fact that his hemoglobin and hematocrit have increased in the short interval period From his most recent discharge.   Leighton Ruff. Redmond Pulling, MD, FACS General, Bariatric, & Minimally Invasive Surgery [facility] Surgery, [place]

## 2013-09-13 NOTE — ED Provider Notes (Signed)
CSN: [id number]     Arrival date & time 09/13/13  1109 History   First MD Initiated Contact with Patient 09/13/13 1222     Chief Complaint  Patient presents with  . Fever    HPI Pt had surgery on July 2.  Pt had a partial colectomy for a mass in his intestines.  The surgery was performed by Dr Brantley Stage.  He was in the hospital for 3 weeks and left last week.  He also had pneumonia while he was in the hospital. He has continued to have diarrhea but it is getting worse.  Night before last pt started having trouble with fever up to 100.9.  He is not eating and he is feeling weaker.    He called the surgeon's office and was told to come to the ED. Past Medical History  Diagnosis Date  . Hyperlipidemia   . Arthritis   . GERD (gastroesophageal reflux disease)   . HOH (hard of hearing)   . Gallstones   . Asthma     if have cold  . Pneumonia     hx   Past Surgical History  Procedure Laterality Date  . Small intestine surgery      intestine blockage  . Back surgery      lower slipped disc  . Cholecystectomy N/A 04/24/2013    Procedure: LAPAROSCOPIC CHOLECYSTECTOMY WITH INTRAOPERATIVE CHOLANGIOGRAM;  Surgeon: [name]. Cornett, MD;  Location: [place];  Service: General;  Laterality: N/A;  attempted  . Cholecystectomy  04/24/2013    Procedure: CHOLECYSTECTOMY;  Surgeon: [name]. Cornett, MD;  Location: [place];  Service: General;;  . Lysis of adhesion N/A 04/24/2013    Procedure: LYSIS OF ADHESION;  Surgeon: [name]. Cornett, MD;  Location: [place];  Service: General;  Laterality: N/A;  . Partial colectomy  08/16/2013    OPEN      BY CORNETT  . Laparoscopic partial colectomy N/A 08/16/2013    Procedure: ATTEMPTED LAPAROSCOPIC PARTIAL COLECTOMY CONVERTED TO OPEN;  Surgeon: Marcello Moores A. Cornett, MD;  Location: [place];  Service: General;  Laterality: N/A;  . Appendectomy     Family History  Problem Relation Age of Onset  . Colon cancer Neg Hx   . Diabetes Mother   . Heart disease Father     had valve  replacement   History  Substance Use Topics  . Smoking status: Former Smoker -- 2.00 packs/day for 12 years    Types: Cigarettes    Quit date: 04/10/1983  . Smokeless tobacco: Current User    Types: Chew     Comment: chew tobacco, tobacco info given 06/29/13  . Alcohol Use: 1.8 oz/week    3 Cans of beer per week     Comment: daily    Review of Systems    Allergies  Tape  Home Medications   Prior to Admission medications   Medication Sig Start Date End Date Taking? Authorizing Provider  acetaminophen (TYLENOL) 500 MG tablet Take 1,000 mg by mouth every 6 (six) hours as needed for mild pain.   Yes Historical Provider, MD  ascorbic acid (VITAMIN C) 500 MG tablet Take 500 mg by mouth daily.   Yes Historical Provider, MD  cholecalciferol (VITAMIN D) 1000 UNITS tablet Take 1,000 Units by mouth daily.   Yes Historical Provider, MD  ibuprofen (ADVIL,MOTRIN) 200 MG tablet Take 200 mg by mouth daily as needed for mild pain.   Yes Historical Provider, MD  Multiple Vitamins-Minerals (CENTRUM SILVER PO) Take  1 tablet by mouth daily.   Yes Historical Provider, MD  oxyCODONE-acetaminophen (PERCOCET/ROXICET) 5-325 MG per tablet Take 1-2 tablets by mouth every 4 (four) hours as needed for severe pain. 08/22/13  Yes Thomas A. Cornett, MD  pantoprazole (PROTONIX) 40 MG tablet Take 1 tablet (40 mg total) by mouth daily. 08/20/13  Yes Thomas A. Cornett, MD  promethazine (PHENERGAN) 12.5 MG tablet Take 1 tablet (12.5 mg total) by mouth every 6 (six) hours as needed for nausea or vomiting. 09/03/13  Yes Marcello Moores A. Cornett, MD  PROVENTIL HFA 108 (90 BASE) MCG/ACT inhaler Inhale 1 puff into the lungs every 6 (six) hours as needed for wheezing or shortness of breath.  03/01/13  Yes Historical Provider, MD  trimethoprim-polymyxin b (POLYTRIM) ophthalmic solution Place 2 drops into both eyes every 6 (six) hours. 09/03/13  Yes Thomas A. Cornett, MD   BP 118/57  Pulse 101  Temp(Src) 99.6 F (37.6 C) (Oral)  Resp  18  Ht 6\' 2"  (1.88 m)  Wt 222 lb (100.699 kg)  BMI 28.49 kg/m2  SpO2 97% Physical Exam  Nursing note and vitals reviewed. Constitutional: He appears well-developed and well-nourished. No distress.  HENT:  Head: Normocephalic and atraumatic.  Right Ear: External ear normal.  Left Ear: External ear normal.  Eyes: Conjunctivae are normal. Right eye exhibits no discharge. Left eye exhibits no discharge. No scleral icterus.  Neck: Neck supple. No tracheal deviation present.  Cardiovascular: Normal rate, regular rhythm and intact distal pulses.   Pulmonary/Chest: Effort normal and breath sounds normal. No stridor. No respiratory distress. He has no wheezes. He has no rales.  Abdominal: Soft. Bowel sounds are normal. He exhibits no distension and no mass. There is no tenderness. There is no rebound and no guarding.  Musculoskeletal: He exhibits no edema and no tenderness.  Neurological: He is alert. He has normal strength. No cranial nerve deficit (no facial droop, extraocular movements intact, no slurred speech) or sensory deficit. He exhibits normal muscle tone. He displays no seizure activity. Coordination normal.  Skin: Skin is warm and dry. No rash noted.  Psychiatric: He has a normal mood and affect.    ED Course  Procedures Labs Review Labs Reviewed  CBC WITH DIFFERENTIAL - Abnormal; Notable for the following:    WBC 10.8 (*)    RBC 3.86 (*)    Hemoglobin 12.3 (*)    HCT 35.6 (*)    Platelets 128 (*)    Basophils Relative 2 (*)    Basophils Absolute 0.2 (*)    All other components within normal limits  COMPREHENSIVE METABOLIC PANEL - Abnormal; Notable for the following:    Sodium 131 (*)    Chloride 94 (*)    Glucose, Bld 159 (*)    Calcium 8.3 (*)    Albumin 3.0 (*)    Anion gap 16 (*)    All other components within normal limits  URINALYSIS, ROUTINE W REFLEX MICROSCOPIC - Abnormal; Notable for the following:    Protein, ur 30 (*)    All other components within normal  limits  URINE MICROSCOPIC-ADD ON - Abnormal; Notable for the following:    Squamous Epithelial / LPF FEW (*)    Crystals CA OXALATE CRYSTALS (*)    All other components within normal limits  I-STAT CG4 LACTIC ACID, ED - Abnormal; Notable for the following:    Lactic Acid, Venous 2.57 (*)    All other components within normal limits  CLOSTRIDIUM DIFFICILE BY PCR  CULTURE,  BLOOD (ROUTINE X 2)  CULTURE, BLOOD (ROUTINE X 2)  COMPREHENSIVE METABOLIC PANEL  CBC    Imaging Review Dg Chest 2 View  09/13/2013   CLINICAL DATA:  Fever.  Weakness.  EXAM: CHEST  2 VIEW  COMPARISON:  CT chest 08/31/2013.  FINDINGS: Mediastinum and hilar structures normal. Heart size is normal. Interim near incomplete clearing of patchy pulmonary infiltrates. No pleural effusion or pneumothorax. No acute bony abnormality.  IMPRESSION: Interim near complete clearing of bilateral patchy pulmonary infiltrates.   Electronically Signed   By: Marcello Moores  Register   On: 09/13/2013 13:34   Ct Abdomen Pelvis W Contrast  09/13/2013   CLINICAL DATA:  Recent colectomy.  Fever  EXAM: CT ABDOMEN AND PELVIS WITH CONTRAST  TECHNIQUE: Multidetector CT imaging of the abdomen and pelvis was performed using the standard protocol following bolus administration of intravenous contrast.  CONTRAST:  161mL OMNIPAQUE IOHEXOL 300 MG/ML  SOLN  COMPARISON:  08/31/2013  FINDINGS: No pleural effusion identified. Significant interval improvement in aeration to both lower lobes.  There is no focal liver abnormality. Previous cholecystectomy. No biliary dilatation. Normal appearance of the pancreas. The spleen is on unremarkable.  The adrenal glands are both normal. Normal appearance of the kidneys. The right kidney is slightly malrotated. The urinary bladder appears normal. Prostate gland and seminal vesicles are unremarkable.  Mild calcified atherosclerotic disease involves the abdominal aorta. There is no retroperitoneal adenopathy. Multiple small mesenteric  lymph nodes are identified. No mesenteric adenopathy. There is no pelvic or inguinal adenopathy. No significant ascites identified within the abdomen or pelvis. No fluid collections identified to suggest abscess.  There is a small hiatal hernia. The stomach is otherwise on unremarkable. Increase caliber of the small bowel loops measures up to 4 cm. The patient is status post right hemicolectomy. The intra colonic anastomosis within the right upper quadrant of the abdomen is patent. Enteric contrast material is identified within the colon up to the rectum.  Review of the visualized osseous structures is significant for mild degenerative disc disease at L5-S1.  IMPRESSION: 1. Postoperative change compatible with right hemicolectomy and intra colonic anastomosis. 2. Increase caliber of small bowel loops without evidence for high-grade bowel obstruction. Enteric contrast material is seen throughout the small bowel and colon. 3. No evidence for bowel perforation or abscess.   Electronically Signed   By: Kerby Moors M.D.   On: 09/13/2013 17:59     MDM  Recent hospitalization for fever and ileus.  Previous CT scan in July suggested ileus vs sbo but not abscess noted.  ID consulted patient and there was concern for local phlebitis in the left forearm.  That has subsequently resolved but he is still having a fever.  I have spoken with general surgery on call who will evaluate the patient.   No obvious source for his fever noted.  CT scan negative.  Continued to have fever up to 101 in the ED.  Dr Redmond Pulling reviewed the findings.  Plan on admitting for observation.  Consider ID consult.   Dorie Rank, MD 09/13/13 2026

## 2013-09-13 NOTE — Consult Note (Signed)
[facility] Surgery Consult Note  Zachary Kim February 15, 1951  [id number].    Requesting MD: Dr. Tomi Bamberger Chief Complaint/Reason for Consult: fevers, nausea, diarrhea, anorexia  HPI:  63 year old male who is post-op laparoscopic converted to open partial colectomy and appendectomy for a cecal mass(tubulovillous adenoma) on 08/16/13 by Dr. Brantley Stage. He was discharged on 09/03/13 after being re-admitted between 08/23/12 and 09/03/13.  At that time he was treated for pneumonia, conjunctivitis, ileus, and diarrhea.  He was discharge home to finish up a 7 day course of antibiotics for HCAP and eye drops for conjunctivitis.  He says he had 3 good days after discharge where he predominantly felt good and slept most of the days.  His antibiotics were discontinued on July 24th.  Since the 24th he's felt fatigued, malaise, nausea, chronic diarrhea, anorexia, feverish, chills, sweats.  His wife notes he feels worse over the last few days and diarrhea seams to be getting worse and he's getting weaker and not wanting to do normal activities.  His activity level has also diminished.  He hasn't tried any home remedies or OTC meds in fear of making things worse.  No radicular symptoms or precipitating/alleviating factors.  He's been taking phenergan.  He was hoping he'd get some antibiotics and be able to go home and get better.  He denies CP/SOB, fevers >101.5, constipation, blood in stools, dysuria, abdominal pain or distension.    Here in [place] he obtained a CXR which showed improved pneumonia, WBC mildly elevated at 10.8, diff normal, lactic acid 2.57, Hgb/Hct improved at 12.3/35.6 and MCV normal, platelets low at 128, glucose 159, urinalysis with protein and calcium oxalate crystals.  ROS: All systems reviewed and otherwise negative except for as above  Family History  Problem Relation Age of Onset  . Colon cancer Neg Hx   . Diabetes Mother   . Heart disease Father     had valve replacement    Past Medical  History  Diagnosis Date  . Hyperlipidemia   . Arthritis   . GERD (gastroesophageal reflux disease)   . HOH (hard of hearing)   . Gallstones   . Asthma     if have cold  . Pneumonia     hx    Past Surgical History  Procedure Laterality Date  . Small intestine surgery      intestine blockage  . Back surgery      lower slipped disc  . Cholecystectomy N/A 04/24/2013    Procedure: LAPAROSCOPIC CHOLECYSTECTOMY WITH INTRAOPERATIVE CHOLANGIOGRAM;  Surgeon: [name]. Cornett, MD;  Location: [place];  Service: General;  Laterality: N/A;  attempted  . Cholecystectomy  04/24/2013    Procedure: CHOLECYSTECTOMY;  Surgeon: [name]. Cornett, MD;  Location: [place];  Service: General;;  . Lysis of adhesion N/A 04/24/2013    Procedure: LYSIS OF ADHESION;  Surgeon: [name]. Cornett, MD;  Location: [place];  Service: General;  Laterality: N/A;  . Partial colectomy  08/16/2013    OPEN      BY CORNETT  . Laparoscopic partial colectomy N/A 08/16/2013    Procedure: ATTEMPTED LAPAROSCOPIC PARTIAL COLECTOMY CONVERTED TO OPEN;  Surgeon: Marcello Moores A. Cornett, MD;  Location: [place];  Service: General;  Laterality: N/A;  . Appendectomy      Social History:  reports that he quit smoking about 30 years ago. His smoking use included Cigarettes. He has a 24 pack-year smoking history. His smokeless tobacco use includes Chew. He reports that he drinks about 1.8  ounces of alcohol per week. He reports that he does not use illicit drugs.  Allergies:  Allergies  Allergen Reactions  . Tape Other (See Comments)    Adhesive tape causes blistered skin     (Not in a hospital admission)  Blood pressure 133/69, pulse 102, temperature 100.3 F (37.9 C), temperature source Oral, resp. rate 18, height 6' 2"  (1.88 m), weight 222 lb (100.699 kg), SpO2 97.00%. Physical Exam: General: pleasant, appears run down/fatigued, WD/WN white male who is laying in bed in mild discomfort HEENT: head is normocephalic, atraumatic.  Sclera are  noninjected.  PERRL.  Ears and nose without any masses or lesions.  Mouth is pink and moist Heart: regular, rate, and rhythm.  No obvious murmurs, gallops, or rubs noted.  Palpable pedal pulses bilaterally Lungs: CTAB, no wheezes, rhonchi, or rales noted.  Respiratory effort nonlabored Abd: soft, minimally distended, not appreciably tender, +BS, no masses, hernias, or organomegaly, midline scar well healed with tiny eschar in superior aspect, no peritoneal signs, no rebound/guarding. MS: all 4 extremities are symmetrical with no cyanosis, clubbing, or edema. Skin: warm and dry with no masses, lesions, or rashes Psych: A&Ox3 with an appropriate affect.   Results for orders placed during the hospital encounter of 09/13/13 (from the past 48 hour(s))  CBC WITH DIFFERENTIAL     Status: Abnormal   Collection Time    09/13/13 12:00 PM      Result Value Ref Range   WBC 10.8 (*) 4.0 - 10.5 K/uL   RBC 3.86 (*) 4.22 - 5.81 MIL/uL   Hemoglobin 12.3 (*) 13.0 - 17.0 g/dL   HCT 35.6 (*) 39.0 - 52.0 %   MCV 92.2  78.0 - 100.0 fL   MCH 31.9  26.0 - 34.0 pg   MCHC 34.6  30.0 - 36.0 g/dL   RDW 14.8  11.5 - 15.5 %   Platelets 128 (*) 150 - 400 K/uL   Neutrophils Relative % 53  43 - 77 %   Lymphocytes Relative 35  12 - 46 %   Monocytes Relative 9  3 - 12 %   Eosinophils Relative 1  0 - 5 %   Basophils Relative 2 (*) 0 - 1 %   Neutro Abs 5.7  1.7 - 7.7 K/uL   Lymphs Abs 3.8  0.7 - 4.0 K/uL   Monocytes Absolute 1.0  0.1 - 1.0 K/uL   Eosinophils Absolute 0.1  0.0 - 0.7 K/uL   Basophils Absolute 0.2 (*) 0.0 - 0.1 K/uL   RBC Morphology POLYCHROMASIA PRESENT     WBC Morphology ATYPICAL LYMPHOCYTES    COMPREHENSIVE METABOLIC PANEL     Status: Abnormal   Collection Time    09/13/13 12:00 PM      Result Value Ref Range   Sodium 131 (*) 137 - 147 mEq/L   Potassium 3.9  3.7 - 5.3 mEq/L   Chloride 94 (*) 96 - 112 mEq/L   CO2 21  19 - 32 mEq/L   Glucose, Bld 159 (*) 70 - 99 mg/dL   BUN 7  6 - 23 mg/dL    Creatinine, Ser 0.85  0.50 - 1.35 mg/dL   Calcium 8.3 (*) 8.4 - 10.5 mg/dL   Total Protein 6.5  6.0 - 8.3 g/dL   Albumin 3.0 (*) 3.5 - 5.2 g/dL   AST 31  0 - 37 U/L   ALT 33  0 - 53 U/L   Alkaline Phosphatase 78  39 - 117 U/L  Total Bilirubin 0.7  0.3 - 1.2 mg/dL   GFR calc non Af Amer >90  >90 mL/min   GFR calc Af Amer >90  >90 mL/min   Comment: (NOTE)     The eGFR has been calculated using the CKD EPI equation.     This calculation has not been validated in all clinical situations.     eGFR's persistently <90 mL/min signify possible Chronic Kidney     Disease.   Anion gap 16 (*) 5 - 15  I-STAT CG4 LACTIC ACID, ED     Status: Abnormal   Collection Time    09/13/13 12:11 PM      Result Value Ref Range   Lactic Acid, Venous 2.57 (*) 0.5 - 2.2 mmol/L  URINALYSIS, ROUTINE W REFLEX MICROSCOPIC     Status: Abnormal   Collection Time    09/13/13  2:36 PM      Result Value Ref Range   Color, Urine YELLOW  YELLOW   APPearance CLEAR  CLEAR   Specific Gravity, Urine 1.021  1.005 - 1.030   pH 6.0  5.0 - 8.0   Glucose, UA NEGATIVE  NEGATIVE mg/dL   Hgb urine dipstick NEGATIVE  NEGATIVE   Bilirubin Urine NEGATIVE  NEGATIVE   Ketones, ur NEGATIVE  NEGATIVE mg/dL   Protein, ur 30 (*) NEGATIVE mg/dL   Urobilinogen, UA 0.2  0.0 - 1.0 mg/dL   Nitrite NEGATIVE  NEGATIVE   Leukocytes, UA NEGATIVE  NEGATIVE  URINE MICROSCOPIC-ADD ON     Status: Abnormal   Collection Time    09/13/13  2:36 PM      Result Value Ref Range   Squamous Epithelial / LPF FEW (*) RARE   WBC, UA 0-2  <3 WBC/hpf   Bacteria, UA RARE  RARE   Crystals CA OXALATE CRYSTALS (*) NEGATIVE   Urine-Other MUCOUS PRESENT     Dg Chest 2 View  09/13/2013   CLINICAL DATA:  Fever.  Weakness.  EXAM: CHEST  2 VIEW  COMPARISON:  CT chest 08/31/2013.  FINDINGS: Mediastinum and hilar structures normal. Heart size is normal. Interim near incomplete clearing of patchy pulmonary infiltrates. No pleural effusion or pneumothorax. No acute  bony abnormality.  IMPRESSION: Interim near complete clearing of bilateral patchy pulmonary infiltrates.   Electronically Signed   By: [place]   On: 09/13/2013 13:34      Assessment/Plan Nausea/Diarrhea Fatigue/Malaise Low grade temps Anorexia   Plan: 1.  Await CT results, lactic acid is increased at 2.57.  Patient has had failure to thrive since he left the hospital on July 20th. 2.  If intraabdominal etiology we would be happy to admit the patient for observation, but if nothing is found would potentially need medicine admission, ID consult for workup of fevers, metabolic/electrolyte abnormalities 3.  NPO, IVF, pain control, antiemetics 4.  Urinalysis is negative other than protein and calcium oxalate crystals, blood cultures pending   Coralie Keens, [facility] Surgery 09/13/2013, 3:38 PM Pager: [phone]

## 2013-09-13 NOTE — ED Notes (Signed)
Patient hooked back up to monitor after returning from CT

## 2013-09-13 NOTE — ED Notes (Signed)
Pt reports July 2nd had part of large intestine removed, reports fever, diarrhea worsening over past few days. Was sent by Dr. Donella Stade for evaluation. Denies pain is a x 4. Reports weakness.

## 2013-09-13 NOTE — ED Notes (Signed)
Patient transported to CT 

## 2013-09-13 NOTE — ED Notes (Signed)
Lactic acid results given to Dr. Hillard Danker

## 2013-09-14 DIAGNOSIS — R630 Anorexia: Secondary | ICD-10-CM | POA: Diagnosis present

## 2013-09-14 DIAGNOSIS — R197 Diarrhea, unspecified: Secondary | ICD-10-CM

## 2013-09-14 DIAGNOSIS — D696 Thrombocytopenia, unspecified: Secondary | ICD-10-CM | POA: Diagnosis present

## 2013-09-14 DIAGNOSIS — R509 Fever, unspecified: Secondary | ICD-10-CM | POA: Diagnosis not present

## 2013-09-14 DIAGNOSIS — E876 Hypokalemia: Secondary | ICD-10-CM | POA: Diagnosis present

## 2013-09-14 DIAGNOSIS — E871 Hypo-osmolality and hyponatremia: Secondary | ICD-10-CM

## 2013-09-14 LAB — COMPREHENSIVE METABOLIC PANEL
ALBUMIN: 2.5 g/dL — AB (ref 3.5–5.2)
ALK PHOS: 69 U/L (ref 39–117)
ALT: 26 U/L (ref 0–53)
AST: 28 U/L (ref 0–37)
Anion gap: 16 — ABNORMAL HIGH (ref 5–15)
BUN: 5 mg/dL — ABNORMAL LOW (ref 6–23)
CALCIUM: 7.7 mg/dL — AB (ref 8.4–10.5)
CO2: 19 mEq/L (ref 19–32)
Chloride: 94 mEq/L — ABNORMAL LOW (ref 96–112)
Creatinine, Ser: 0.8 mg/dL (ref 0.50–1.35)
GFR calc non Af Amer: >90 mL/min (ref >90)
Glucose, Bld: 123 mg/dL — ABNORMAL HIGH (ref 70–99)
POTASSIUM: 3.6 meq/L — AB (ref 3.7–5.3)
SODIUM: 129 meq/L — AB (ref 137–147)
Total Bilirubin: 0.6 mg/dL (ref 0.3–1.2)
Total Protein: 5.4 g/dL — ABNORMAL LOW (ref 6.0–8.3)

## 2013-09-14 LAB — BASIC METABOLIC PANEL
Anion gap: 11 (ref 5–15)
BUN: 5 mg/dL — ABNORMAL LOW (ref 6–23)
CO2: 21 mEq/L (ref 19–32)
CREATININE: 0.84 mg/dL (ref 0.50–1.35)
Calcium: 8 mg/dL — ABNORMAL LOW (ref 8.4–10.5)
Chloride: 99 mEq/L (ref 96–112)
Glucose, Bld: 144 mg/dL — ABNORMAL HIGH (ref 70–99)
POTASSIUM: 3.7 meq/L (ref 3.7–5.3)
Sodium: 131 mEq/L — ABNORMAL LOW (ref 137–147)

## 2013-09-14 LAB — CBC
HEMATOCRIT: 29.3 % — AB (ref 39.0–52.0)
Hemoglobin: 10.2 g/dL — ABNORMAL LOW (ref 13.0–17.0)
MCH: 31.4 pg (ref 26.0–34.0)
MCHC: 34.8 g/dL (ref 30.0–36.0)
MCV: 90.2 fL (ref 78.0–100.0)
Platelets: 138 10*3/uL — ABNORMAL LOW (ref 150–400)
RBC: 3.25 MIL/uL — ABNORMAL LOW (ref 4.22–5.81)
RDW: 15 % (ref 11.5–15.5)
WBC: 11.3 10*3/uL — ABNORMAL HIGH (ref 4.0–10.5)

## 2013-09-14 LAB — MAGNESIUM: Magnesium: 1.8 mg/dL (ref 1.5–2.5)

## 2013-09-14 LAB — T4, FREE: Free T4: 1.15 ng/dL (ref 0.80–1.80)

## 2013-09-14 LAB — TSH: TSH: 2.99 u[IU]/mL (ref 0.350–4.500)

## 2013-09-14 MED ORDER — FAMOTIDINE 20 MG PO TABS
20.0000 mg | ORAL_TABLET | Freq: Two times a day (BID) | ORAL | Status: DC
  Administered 2013-09-14 – 2013-09-15 (×3): 20 mg via ORAL
  Filled 2013-09-14 (×4): qty 1

## 2013-09-14 MED ORDER — ZOLPIDEM TARTRATE 5 MG PO TABS
5.0000 mg | ORAL_TABLET | Freq: Every evening | ORAL | Status: DC | PRN
  Administered 2013-09-14: 5 mg via ORAL
  Filled 2013-09-14: qty 1

## 2013-09-14 MED ORDER — ENSURE COMPLETE PO LIQD
237.0000 mL | Freq: Three times a day (TID) | ORAL | Status: DC
  Administered 2013-09-14: 237 mL via ORAL

## 2013-09-14 MED ORDER — ONDANSETRON HCL 4 MG/2ML IJ SOLN
4.0000 mg | Freq: Four times a day (QID) | INTRAMUSCULAR | Status: DC | PRN
  Administered 2013-09-15: 4 mg via INTRAVENOUS
  Filled 2013-09-14: qty 2

## 2013-09-14 MED ORDER — ENSURE COMPLETE PO LIQD
237.0000 mL | Freq: Two times a day (BID) | ORAL | Status: DC
Start: 2013-09-15 — End: 2013-09-15

## 2013-09-14 MED ORDER — POTASSIUM CHLORIDE CRYS ER 20 MEQ PO TBCR
20.0000 meq | EXTENDED_RELEASE_TABLET | Freq: Two times a day (BID) | ORAL | Status: DC
  Administered 2013-09-14 – 2013-09-15 (×3): 20 meq via ORAL
  Filled 2013-09-14 (×4): qty 1

## 2013-09-14 MED ORDER — POTASSIUM CHLORIDE CRYS ER 20 MEQ PO TBCR
40.0000 meq | EXTENDED_RELEASE_TABLET | Freq: Once | ORAL | Status: AC
  Administered 2013-09-14: 40 meq via ORAL
  Filled 2013-09-14: qty 2

## 2013-09-14 MED ORDER — SACCHAROMYCES BOULARDII 250 MG PO CAPS
250.0000 mg | ORAL_CAPSULE | Freq: Two times a day (BID) | ORAL | Status: DC
Start: 2013-09-14 — End: 2013-09-15
  Administered 2013-09-14 – 2013-09-15 (×3): 250 mg via ORAL
  Filled 2013-09-14 (×4): qty 1

## 2013-09-14 NOTE — Progress Notes (Signed)
INITIAL NUTRITION ASSESSMENT  DOCUMENTATION CODES Per approved criteria  -Non-severe (moderate) malnutrition in the context of chronic illness   INTERVENTION: Ensure Complete po BID, each supplement provides 350 kcal and 13 grams of protein  NUTRITION DIAGNOSIS: Inadequate oral intake related to decreased appetite as evidenced by diet hx.   Goal: Pt will meet >90% of estimated nutritional needs  Monitor:  PO/supplement intake, labs, weight changes, I/O's  Reason for Assessment: MST  63 y.o. male  Admitting Dx: Fever, unspecified  Pt with a past medical history of hyperlipidemia, gastroesophageal reflux disease who underwent an open partial colectomy for a villous adenoma on 08/16/13. He was readmitted on 7/9 and treated for an ileus, pneumonia diarrhea and conjunctivitis.  Per his wife he finished 4 days of antibiotics at home and then began to decline afterwards. He started having low-grade fevers and profuse diarrhea and nausea. He returned to the hospital on 7/30 due to fever nausea diarrhea and poor by mouth intake. He was admitted by the surgical service and underwent a CT scan of the abdomen and pelvis to ensure there was no anastomotic leak or other postsurgical issues. The CT was unremarkable.  His wife states that he's had at least 8 large bowel movements today despite barely eating. He continues to be nauseated but does not have any abdominal pain. His temperature was 101.1 yesterday at 5 PM 100.3 at 3 AM today.  ASSESSMENT: Pt asleep at time of visit. Unable to complete nutrition focused physical exam, due to somnolence.  Pt wife reports poor oral intake and weight loss for the past month. She estimates a 15# wt loss x 1 month (approcimately 5%), which is consistent with documented wt hx. She confirms this started after surgery on 08/16/13. She reports he has been drinking Ensure at home. Otherwise intake has been minimal, with small portions. Diet recall reveals: Breakfast: egg,  bacon, and cheese sandwich; Lunch: green beans and corn; Dinner: meat and vegetable. She reports protein intake is low because pt eats very little meat. RD discussed ways to increase protein in diet to rehabilitate nutritional status, including eating yogurt, eggs, cottage cheese, and milk. Encouraged continued use of nutritional supplement.  PO: 100% per documented intake. However, pt wife reports he ate very little at lunch.  Labs reviewed. Na: 131, Glucose: 134.  Height: Ht Readings from Last 1 Encounters:  09/13/13 6\' 2"  (1.88 m)    Weight: Wt Readings from Last 1 Encounters:  09/13/13 222 lb (100.699 kg)    Ideal Body Weight: 190#  % Ideal Body Weight: 117%  Wt Readings from Last 10 Encounters:  09/13/13 222 lb (100.699 kg)  08/23/13 219 lb 12.8 oz (99.7 kg)  08/16/13 233 lb 6.4 oz (105.87 kg)  08/16/13 233 lb 6.4 oz (105.87 kg)  08/08/13 234 lb 1.6 oz (106.187 kg)  07/20/13 230 lb (104.327 kg)  07/10/13 232 lb (105.235 kg)  06/29/13 232 lb (105.235 kg)  06/25/13 231 lb (104.781 kg)  05/14/13 228 lb (103.42 kg)   Usual Body Weight: 230#  % Usual Body Weight: 97%  BMI:  Body mass index is 28.49 kg/(m^2). Overweight  Estimated Nutritional Needs: Kcal: 2300-2500 Protein: 120-130 grams Fluid: >2.3 L  Skin: 3 port abdominal incision  Diet Order: General  EDUCATION NEEDS: -Education needs addressed   Intake/Output Summary (Last 24 hours) at 09/14/13 1526 Last data filed at 09/14/13 1007  Gross per 24 hour  Intake 1516.67 ml  Output      0 ml  Net 1516.67 ml    Last BM: 09/13/13  Labs:   Recent Labs Lab 09/13/13 1200 09/14/13 0435 09/14/13 1420  NA 131* 129* 131*  K 3.9 3.6* 3.7  CL 94* 94* 99  CO2 21 19 21   BUN 7 5* 5*  CREATININE 0.85 0.80 0.84  CALCIUM 8.3* 7.7* 8.0*  MG  --   --  1.8  GLUCOSE 159* 123* 144*    CBG (last 3)  No results found for this basename: GLUCAP,  in the last 72 hours  Scheduled Meds: . enoxaparin (LOVENOX)  injection  40 mg Subcutaneous Q24H  . famotidine  20 mg Oral BID  . potassium chloride  20 mEq Oral BID  . saccharomyces boulardii  250 mg Oral BID  . trimethoprim-polymyxin b  2 drop Both Eyes 4 times per day    Continuous Infusions: . 0.9 % NaCl with KCl 20 mEq / L 150 mL/hr at 09/14/13 1345    Past Medical History  Diagnosis Date  . Hyperlipidemia   . Arthritis   . GERD (gastroesophageal reflux disease)   . HOH (hard of hearing)   . Gallstones   . Asthma     if have cold  . Pneumonia     hx    Past Surgical History  Procedure Laterality Date  . Small intestine surgery      intestine blockage  . Back surgery      lower slipped disc  . Cholecystectomy N/A 04/24/2013    Procedure: LAPAROSCOPIC CHOLECYSTECTOMY WITH INTRAOPERATIVE CHOLANGIOGRAM;  Surgeon: Zachary Faster. Cornett, MD;  Location: [place];  Service: General;  Laterality: N/A;  attempted  . Cholecystectomy  04/24/2013    Procedure: CHOLECYSTECTOMY;  Surgeon: Zachary Faster. Cornett, MD;  Location: [place];  Service: General;;  . Lysis of adhesion N/A 04/24/2013    Procedure: LYSIS OF ADHESION;  Surgeon: Zachary Faster. Cornett, MD;  Location: [place];  Service: General;  Laterality: N/A;  . Partial colectomy  08/16/2013    OPEN      BY Kim  . Laparoscopic partial colectomy N/A 08/16/2013    Procedure: ATTEMPTED LAPAROSCOPIC PARTIAL COLECTOMY CONVERTED TO OPEN;  Surgeon: Zachary Moores A. Cornett, MD;  Location: [place];  Service: General;  Laterality: N/A;  . Appendectomy      Zachary Kim A. Zachary Kim, RD, LDN Pager: [phone] After hours Pager: [phone]

## 2013-09-14 NOTE — Consult Note (Addendum)
[facility] Medical Consultation  Zachary Kim HQI:[id number] DOB: 10-Jan-1951 DOA: 09/13/2013 PCP: Suzan Garibaldi, FNP   Requesting physician: Dr Hulen Skains Date of consultation: 09/14/13 Reason for consultation: fevers/ failure to thrive  Impression/Recommendations Principal Problem:   Fever, unspecified--  Nausea, Diarrhea -No source found as of yet-it is to be noted that he did have oral contrast yesterday which could have exacerbated his diarrhea today -Will DC Protonix and Ensure as these can cause loose stools as well and confound the picture - switch to Pepcid -I will order a venous duplex of lower extremities to look for a DVT and also a GI pathogen panel -He's had a cholecystectomy -Okay for solid food as tolerated -Hold off on antimotility agents unless we are sure that there is no bacterial infectious cause  Active Problems:   Hyponatremia -Likely due to dehydration-I will increase normal saline 250 cc an hour as he is barely taking in any liquids and I will check another sodium level later today and again tomorrow    Anorexia -Will order breeze supplements    Hypokalemia -Replace via IV and orally- -check magnesium level and replace as needed   [facility] will followup again tomorrow. Please contact me if I can be of assistance in the meanwhile. Thank you for this consultation.  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 He was readmitted on 7/9 and treated for an ileus, pneumonia diarrhea and conjunctivitis. Per his wife he finished 4 days of antibiotics at home and then began to decline afterwards. He started having low-grade fevers and profuse diarrhea and nausea. He returned to the hospital on 7/30 due to fever nausea diarrhea and poor by mouth intake. He was  admitted by the surgical service and underwent a CT scan of the abdomen and pelvis to ensure there was no anastomotic leak or other postsurgical issues. The CT was unremarkable. His wife states that he's had at least 8 large bowel movements today despite barely eating. He continues to be nauseated but does not have any abdominal pain. His temperature was 101.1 yesterday at 5 PM 100.3 at 3 AM today.  Review of Systems:  Review of Systems  Constitutional: The patient states that he was having night sweats even prior to his abdominal surgery. HENT: Negative for ear pain, nosebleeds, congestion, facial swelling, rhinorrhea, neck pain, neck stiffness and ear discharge.   Respiratory: Negative for cough, shortness of breath, wheezing  Cardiovascular: Negative for chest pain, palpitations and leg swelling.  Gastrointestinal: Per history of present illness Genitourinary: Negative for dysuria, urgency, frequency, hematuria Musculoskeletal: Negative for back pain or joint pain Neurological: Negative for dizziness, seizures, syncope, focal weakness,  numbness and headaches.  Hematological: Does not bruise/bleed easily.  Psychiatric/Behavioral: Negative for hallucinations, confusion, dysphoric mood   Past Medical History  Diagnosis Date  . Hyperlipidemia   . Arthritis   . GERD (gastroesophageal reflux disease)   . HOH (hard of hearing)   . Gallstones   . Asthma     if have cold  . Pneumonia     hx   Past Surgical History  Procedure Laterality Date  . Small intestine surgery      intestine blockage  . Back surgery      lower slipped disc  . Cholecystectomy N/A 04/24/2013    Procedure: LAPAROSCOPIC CHOLECYSTECTOMY  WITH INTRAOPERATIVE CHOLANGIOGRAM;  Surgeon: [name]. Cornett, MD;  Location: [place];  Service: General;  Laterality: N/A;  attempted  . Cholecystectomy  04/24/2013    Procedure: CHOLECYSTECTOMY;  Surgeon: [name]. Cornett, MD;  Location: [place];  Service: General;;  . Lysis of  adhesion N/A 04/24/2013    Procedure: LYSIS OF ADHESION;  Surgeon: [name]. Cornett, MD;  Location: [place];  Service: General;  Laterality: N/A;  . Partial colectomy  08/16/2013    OPEN      BY CORNETT  . Laparoscopic partial colectomy N/A 08/16/2013    Procedure: ATTEMPTED LAPAROSCOPIC PARTIAL COLECTOMY CONVERTED TO OPEN;  Surgeon: Marcello Moores A. Cornett, MD;  Location: [place];  Service: General;  Laterality: N/A;  . Appendectomy     Social History:  reports that he quit smoking about 30 years ago. His smoking use included Cigarettes. He has a 24 pack-year smoking history. His smokeless tobacco use includes Chew. He reports that he drinks about 1.8 ounces of alcohol per week. He reports that he does not use illicit drugs.  Allergies  Allergen Reactions  . Tape Other (See Comments)    Adhesive tape causes blistered skin   Family History  Problem Relation Age of Onset  . Colon cancer Neg Hx   . Diabetes Mother   . Heart disease Father     had valve replacement    Prior to Admission medications   Medication Sig Start Date End Date Taking? Authorizing Provider  acetaminophen (TYLENOL) 500 MG tablet Take 1,000 mg by mouth every 6 (six) hours as needed for mild pain.   Yes Historical Provider, MD  ascorbic acid (VITAMIN C) 500 MG tablet Take 500 mg by mouth daily.   Yes Historical Provider, MD  cholecalciferol (VITAMIN D) 1000 UNITS tablet Take 1,000 Units by mouth daily.   Yes Historical Provider, MD  ibuprofen (ADVIL,MOTRIN) 200 MG tablet Take 200 mg by mouth daily as needed for mild pain.   Yes Historical Provider, MD  Multiple Vitamins-Minerals (CENTRUM SILVER PO) Take 1 tablet by mouth daily.   Yes Historical Provider, MD  oxyCODONE-acetaminophen (PERCOCET/ROXICET) 5-325 MG per tablet Take 1-2 tablets by mouth every 4 (four) hours as needed for severe pain. 08/22/13  Yes Thomas A. Cornett, MD  pantoprazole (PROTONIX) 40 MG tablet Take 1 tablet (40 mg total) by mouth daily. 08/20/13  Yes Thomas A.  Cornett, MD  promethazine (PHENERGAN) 12.5 MG tablet Take 1 tablet (12.5 mg total) by mouth every 6 (six) hours as needed for nausea or vomiting. 09/03/13  Yes Marcello Moores A. Cornett, MD  PROVENTIL HFA 108 (90 BASE) MCG/ACT inhaler Inhale 1 puff into the lungs every 6 (six) hours as needed for wheezing or shortness of breath.  03/01/13  Yes Historical Provider, MD  trimethoprim-polymyxin b (POLYTRIM) ophthalmic solution Place 2 drops into both eyes every 6 (six) hours. 09/03/13  Yes Thomas A. Cornett, MD    Physical Exam: Blood pressure 122/70, pulse 76, temperature 97.6 F (36.4 C), temperature source Oral, resp. rate 16, height 6\' 2"  (1.88 m), weight 100.699 kg (222 lb), SpO2 99.00%. @VITALS2 @ [employer]   09/13/13 1126  Weight: 100.699 kg (222 lb)    Intake/Output Summary (Last 24 hours) at 09/14/13 1351 Last data filed at 09/14/13 1007  Gross per 24 hour  Intake 1516.67 ml  Output      0 ml  Net 1516.67 ml     Constitutional: Appears well-developed and well-nourished. No distress. HENT: Normocephalic. External right and  left ear normal. Oropharynx is clear and moist.  Eyes: Conjunctivae and EOM are normal. PERRLA, no scleral icterus.  Neck: Normal ROM. Neck supple. No JVD. No tracheal deviation. No thyromegaly.  CVS: RRR, S1/S2 +, no murmurs, no gallops, no carotid bruit.  Pulmonary: Effort and breath sounds normal, no stridor, rhonchi, wheezes, rales.  Abdominal: Soft. BS +, mild distension and tympanic to percussion, no tenderness, rebound or guarding. Surgical scar is healing well  Musculoskeletal: Normal range of motion. No edema and no tenderness.  Neuro: Alert. Normal reflexes, muscle tone coordination. No cranial nerve deficit. Skin: Skin is warm and dry. No rash noted. Not diaphoretic. No erythema. No pallor.  Psychiatric: Normal mood and affect. Behavior, judgment, thought content normal.    Labs on Admission:  Basic Metabolic Panel:  Recent Labs Lab 09/13/13 1200  09/14/13 0435  NA 131* 129*  K 3.9 3.6*  CL 94* 94*  CO2 21 19  GLUCOSE 159* 123*  BUN 7 5*  CREATININE 0.85 0.80  CALCIUM 8.3* 7.7*   Liver Function Tests:  Recent Labs Lab 09/13/13 1200 09/14/13 0435  AST 31 28  ALT 33 26  ALKPHOS 78 69  BILITOT 0.7 0.6  PROT 6.5 5.4*  ALBUMIN 3.0* 2.5*   No results found for this basename: LIPASE, AMYLASE,  in the last 168 hours No results found for this basename: AMMONIA,  in the last 168 hours CBC:  Recent Labs Lab 09/13/13 1200 09/14/13 0435  WBC 10.8* 11.3*  NEUTROABS 5.7  --   HGB 12.3* 10.2*  HCT 35.6* 29.3*  MCV 92.2 90.2  PLT 128* 138*   Cardiac Enzymes: No results found for this basename: CKTOTAL, CKMB, CKMBINDEX, TROPONINI,  in the last 168 hours BNP: No components found with this basename: POCBNP,  CBG: No results found for this basename: GLUCAP,  in the last 168 hours  Radiological Exams on Admission: Dg Chest 2 View  09/13/2013   CLINICAL DATA:  Fever.  Weakness.  EXAM: CHEST  2 VIEW  COMPARISON:  CT chest 08/31/2013.  FINDINGS: Mediastinum and hilar structures normal. Heart size is normal. Interim near incomplete clearing of patchy pulmonary infiltrates. No pleural effusion or pneumothorax. No acute bony abnormality.  IMPRESSION: Interim near complete clearing of bilateral patchy pulmonary infiltrates.   Electronically Signed   By: Marcello Moores  Register   On: 09/13/2013 13:34   Ct Abdomen Pelvis W Contrast  09/13/2013   CLINICAL DATA:  Recent colectomy.  Fever  EXAM: CT ABDOMEN AND PELVIS WITH CONTRAST  TECHNIQUE: Multidetector CT imaging of the abdomen and pelvis was performed using the standard protocol following bolus administration of intravenous contrast.  CONTRAST:  147mL OMNIPAQUE IOHEXOL 300 MG/ML  SOLN  COMPARISON:  08/31/2013  FINDINGS: No pleural effusion identified. Significant interval improvement in aeration to both lower lobes.  There is no focal liver abnormality. Previous cholecystectomy. No biliary  dilatation. Normal appearance of the pancreas. The spleen is on unremarkable.  The adrenal glands are both normal. Normal appearance of the kidneys. The right kidney is slightly malrotated. The urinary bladder appears normal. Prostate gland and seminal vesicles are unremarkable.  Mild calcified atherosclerotic disease involves the abdominal aorta. There is no retroperitoneal adenopathy. Multiple small mesenteric lymph nodes are identified. No mesenteric adenopathy. There is no pelvic or inguinal adenopathy. No significant ascites identified within the abdomen or pelvis. No fluid collections identified to suggest abscess.  There is a small hiatal hernia. The stomach is otherwise on unremarkable. Increase caliber of the  small bowel loops measures up to 4 cm. The patient is status post right hemicolectomy. The intra colonic anastomosis within the right upper quadrant of the abdomen is patent. Enteric contrast material is identified within the colon up to the rectum.  Review of the visualized osseous structures is significant for mild degenerative disc disease at L5-S1.  IMPRESSION: 1. Postoperative change compatible with right hemicolectomy and intra colonic anastomosis. 2. Increase caliber of small bowel loops without evidence for high-grade bowel obstruction. Enteric contrast material is seen throughout the small bowel and colon. 3. No evidence for bowel perforation or abscess.   Electronically Signed   By: Kerby Moors M.D.   On: 09/13/2013 17:59      Time spent: Greater than 45 minutes  Zachary Canady, MD [facility] To page rounding or on call physician  [email or web address] 09/14/2013, 1:51 PM

## 2013-09-14 NOTE — Progress Notes (Signed)
Patient expresses that he is still having loose stools that occur more in the morning.  He had a fever of 101 at 1900. Patient was given two percocet po and his follow up temp was 98.6 at 2100. Patient stated that he has had trouble sleeping.  Notified Dr. Grandville Silos and orders received for 5mg  ambien po at bedtime. This was given to the patient and he is sleeping. He and his wife are emotional and anxious regarding this hospitalization as they await his lab results. Will continue to monitor.

## 2013-09-14 NOTE — Progress Notes (Signed)
[facility] Progress Note     Subjective: His symptoms of fatigue, malaise, diarrhea, anorexia, nausea, night chills/sweats that soak the bed have been ongoing for the last 2 weeks.  He says he feels a bit better.  I ask him and his wife about his stress level.  He admits to not sleeping well, being anxious, nervous, frustrated, stressed about his medical conditions as well as what his recent surgery/disability has meant to his family, job, activity level.  He admits to worrying excessively and not wanting to get out of bed to do his normal activities.  He has no energy.  He want to go home.    Objective: Vital signs in last 24 hours: Temp:  [98.3 F (36.8 C)-101.1 F (38.4 C)] 98.3 F (36.8 C) (07/31 0521) Pulse Rate:  [76-106] 76 (07/31 0521) Resp:  [14-24] 16 (07/31 0521) BP: (114-144)/(57-71) 122/70 mmHg (07/31 0521) SpO2:  [95 %-100 %] 99 % (07/31 0521) Weight:  [222 lb (100.699 kg)] 222 lb (100.699 kg) (07/30 1126) Last BM Date: 09/13/13  Intake/Output from previous day: 07/30 0701 - 07/31 0700 In: 1156.7 [P.O.:240; I.V.:916.7] Out: -  Intake/Output this shift:    PE: Gen:  Alert, NAD, frustrated and tearful Card:  RRR, no M/G/R heard Pulm:  CTA, no W/R/R Abd: Soft, NT/ND, +BS, no HSM, midline incision healing well Ext:  No erythema, edema, or tenderness  Lab Results:   Recent Labs  09/13/13 1200 09/14/13 0435  WBC 10.8* 11.3*  HGB 12.3* 10.2*  HCT 35.6* 29.3*  PLT 128* 138*   BMET  Recent Labs  09/13/13 1200 09/14/13 0435  NA 131* 129*  K 3.9 3.6*  CL 94* 94*  CO2 21 19  GLUCOSE 159* 123*  BUN 7 5*  CREATININE 0.85 0.80  CALCIUM 8.3* 7.7*   PT/INR No results found for this basename: LABPROT, INR,  in the last 72 hours CMP     Component Value Date/Time   NA 129* 09/14/2013 0435   K 3.6* 09/14/2013 0435   CL 94* 09/14/2013 0435   CO2 19 09/14/2013 0435   GLUCOSE 123* 09/14/2013 0435   BUN 5* 09/14/2013 0435   CREATININE 0.80 09/14/2013  0435   CALCIUM 7.7* 09/14/2013 0435   PROT 5.4* 09/14/2013 0435   ALBUMIN 2.5* 09/14/2013 0435   AST 28 09/14/2013 0435   ALT 26 09/14/2013 0435   ALKPHOS 69 09/14/2013 0435   BILITOT 0.6 09/14/2013 0435   GFRNONAA >90 09/14/2013 0435   GFRAA >90 09/14/2013 0435   Lipase     Component Value Date/Time   LIPASE 19 08/23/2013 0859       Studies/Results: Dg Chest 2 View  09/13/2013   CLINICAL DATA:  Fever.  Weakness.  EXAM: CHEST  2 VIEW  COMPARISON:  CT chest 08/31/2013.  FINDINGS: Mediastinum and hilar structures normal. Heart size is normal. Interim near incomplete clearing of patchy pulmonary infiltrates. No pleural effusion or pneumothorax. No acute bony abnormality.  IMPRESSION: Interim near complete clearing of bilateral patchy pulmonary infiltrates.   Electronically Signed   By: Marcello Moores  Register   On: 09/13/2013 13:34   Ct Abdomen Pelvis W Contrast  09/13/2013   CLINICAL DATA:  Recent colectomy.  Fever  EXAM: CT ABDOMEN AND PELVIS WITH CONTRAST  TECHNIQUE: Multidetector CT imaging of the abdomen and pelvis was performed using the standard protocol following bolus administration of intravenous contrast.  CONTRAST:  112mL OMNIPAQUE IOHEXOL 300 MG/ML  SOLN  COMPARISON:  08/31/2013  FINDINGS:  No pleural effusion identified. Significant interval improvement in aeration to both lower lobes.  There is no focal liver abnormality. Previous cholecystectomy. No biliary dilatation. Normal appearance of the pancreas. The spleen is on unremarkable.  The adrenal glands are both normal. Normal appearance of the kidneys. The right kidney is slightly malrotated. The urinary bladder appears normal. Prostate gland and seminal vesicles are unremarkable.  Mild calcified atherosclerotic disease involves the abdominal aorta. There is no retroperitoneal adenopathy. Multiple small mesenteric lymph nodes are identified. No mesenteric adenopathy. There is no pelvic or inguinal adenopathy. No significant ascites identified  within the abdomen or pelvis. No fluid collections identified to suggest abscess.  There is a small hiatal hernia. The stomach is otherwise on unremarkable. Increase caliber of the small bowel loops measures up to 4 cm. The patient is status post right hemicolectomy. The intra colonic anastomosis within the right upper quadrant of the abdomen is patent. Enteric contrast material is identified within the colon up to the rectum.  Review of the visualized osseous structures is significant for mild degenerative disc disease at L5-S1.  IMPRESSION: 1. Postoperative change compatible with right hemicolectomy and intra colonic anastomosis. 2. Increase caliber of small bowel loops without evidence for high-grade bowel obstruction. Enteric contrast material is seen throughout the small bowel and colon. 3. No evidence for bowel perforation or abscess.   Electronically Signed   By: Kerby Moors M.D.   On: 09/13/2013 17:59    Anti-infectives: Anti-infectives   None       Assessment/Plan Nausea/Diarrhea  Fatigue/Malaise  Low grade temps  Anorexia Night sweats/chills ?Adjustment disorder due to medical problems Recent PNA - resolved on CXR Hypokalemia  Plan: 1.  CT scan has normal post-op changes, no concern for leak or abscess etc.   2.  Call medicine to evaluate and see if any endocrine/metabolic workup needs to be done.  Will order a TSH and T4. 3.  May just be dehydration and some degree of intermittent ileus after such a major surgery.  I know he is extremely stressed and its affecting other aspects of his life.  He does not want to be here in the hospital, but his wife is very concerned about him.   4.  I've encouraged them both to make appts with their PCP's regarding stress and sleeping problems as well, but I think we need to rule out other sources of these symptoms.  His symptoms also go alone with concern for cancer as well as many metabolic/endocrine problems.   5.  He may be able to be  discharged today if medicine agrees, but maybe we can get some of the lab workup started today 6.  The family questions whether we need to call back ID to evaluate him, but I do not have any evidence of current infection other than slightly elevated WBC and low grade temps >101.1*F.  Blood cultures/gram stain pending.  C. Diff negative 7.  Supplement K+ with Kdur 36meq and IVF  8.  Will re-consult ID - Dr. Starlette Thurow Salon has seen him in the past    LOS: 1 day    DORT, [facility] 09/14/2013, 8:00 AM Pager: [phone]

## 2013-09-14 NOTE — Progress Notes (Signed)
Utilization Review Completed.Zachary Kim T7/31/2015  

## 2013-09-14 NOTE — Progress Notes (Signed)
Patient ID: Zachary Kim, male   DOB: 04-03-1950, 63 y.o.   MRN: [id number]         [facility]    Date of Admission:  09/13/2013       Active Problems:   Tubulovillous adenoma of colon   S/P partial colectomy   S/P cholecystectomy March 20125   Normocytic anemia   Hyperglycemia   Hyponatremia   Failure to thrive in adult   Diarrhea   Anorexia   Hypokalemia   Thrombocytopenia   . enoxaparin (LOVENOX) injection  40 mg Subcutaneous Q24H  . famotidine  20 mg Oral BID  . potassium chloride  20 mEq Oral BID  . saccharomyces boulardii  250 mg Oral BID  . trimethoprim-polymyxin b  2 drop Both Eyes 4 times per day    Subjective: Zachary Kim is a 63 y.o. male who underwent laparoscopic cholecystectomy in March. He was noted have a cecal mass at that time and was eventually found to have a tubulovillous adenoma. He was readmitted on July 2 underwent open resection of his terminal ileum and descending colon. He seemed to be recovering uneventfully except for postoperative ileus until he developed fever 2 weeks postoperatively. The source of his fever was not entirely clear. He has had diarrhea ever since surgery but his C. difficile PCR have been negative. He had some bilateral conjunctivitis and was treated with trimethoprim polymyxin eyedrops. He also had some left arm phlebitis and a former IV site that could have been the source and there was concern that he had early HCAP. He defervesced on empiric IV antibiotics and was switched to oral levofloxacin upon discharge on July 20. He took levofloxacin until July 24 and was feeling better. However since that time he does become progressively weaker with anorexia, fatigue, persistent nausea slightly worsening diarrhea, fever, chills and sweats leading to readmission last night. He had a fever of 101.1. He states that he is feeling a little bit better today.  Review of Systems: Constitutional: positive for anorexia,  chills, fevers, malaise and sweats, negative for weight loss Eyes: his conjunctivitis has resolved Ears, nose, mouth, throat, and face: negative Respiratory: negative Cardiovascular: negative Gastrointestinal: positive for diarrhea and nausea, negative for abdominal pain, constipation, reflux symptoms and vomiting Genitourinary:negative Integument/breast: negative  Past Medical History  Diagnosis Date  . Hyperlipidemia   . Arthritis   . GERD (gastroesophageal reflux disease)   . HOH (hard of hearing)   . Gallstones   . Asthma     if have cold  . Pneumonia     hx    History  Substance Use Topics  . Smoking status: Former Smoker -- 2.00 packs/day for 12 years    Types: Cigarettes    Quit date: 04/10/1983  . Smokeless tobacco: Current User    Types: Chew     Comment: chew tobacco, tobacco info given 06/29/13  . Alcohol Use: 1.8 oz/week    3 Cans of beer per week     Comment: daily    Family History  Problem Relation Age of Onset  . Colon cancer Neg Hx   . Diabetes Mother   . Heart disease Father     had valve replacement    Allergies  Allergen Reactions  . Tape Other (See Comments)    Adhesive tape causes blistered skin    Objective: Temp:  [97.6 F (36.4 C)-101.1 F (38.4 C)] 97.8 F (36.6 C) (07/31 1340) Pulse Rate:  [72-102] 72 (  07/31 1340) Resp:  [14-22] 16 (07/31 1340) BP: (114-134)/(57-70) 128/68 mmHg (07/31 1340) SpO2:  [95 %-100 %] 99 % (07/31 1340)  General: He appears worn out but is in no distress Skin: No rash. No evidence of thrombophlebitis in his left forearm Eyes: Conjunctivitis has resolved Oral: No oropharyngeal lesions Lungs: Clear Cor: Regular S1 and S2 with no murmur Abdomen: Soft and nontender without palpable masses. He has ahealed midline scar with one small area of scabbing without drainage. There is a healed right upper quadrant scar Joints and extremities: No acute abnormalities  Lab Results Lab Results  Component Value Date    WBC 11.3* 09/14/2013   HGB 10.2* 09/14/2013   HCT 29.3* 09/14/2013   MCV 90.2 09/14/2013   PLT 138* 09/14/2013    Lab Results  Component Value Date   CREATININE 0.84 09/14/2013   BUN 5* 09/14/2013   NA 131* 09/14/2013   K 3.7 09/14/2013   CL 99 09/14/2013   CO2 21 09/14/2013    Lab Results  Component Value Date   ALT 26 09/14/2013   AST 28 09/14/2013   ALKPHOS 69 09/14/2013   BILITOT 0.6 09/14/2013      Microbiology: Recent Results (from the past 240 hour(s))  CULTURE, BLOOD (ROUTINE X 2)     Status: None   Collection Time    09/13/13  1:15 PM      Result Value Ref Range Status   Specimen Description BLOOD RIGHT ANTECUBITAL   Final   Special Requests BOTTLES DRAWN AEROBIC AND ANAEROBIC 10CC   Final   Culture  Setup Time     Final   Value: 09/13/2013 17:14     Performed at [facility]   Culture     Final   Value:        BLOOD CULTURE RECEIVED NO GROWTH TO DATE CULTURE WILL BE HELD FOR 5 DAYS BEFORE ISSUING A FINAL NEGATIVE REPORT     Performed at [facility]   Report Status PENDING   Incomplete  CULTURE, BLOOD (ROUTINE X 2)     Status: None   Collection Time    09/13/13  1:45 PM      Result Value Ref Range Status   Specimen Description BLOOD RIGHT ANTECUBITAL   Final   Special Requests BOTTLES DRAWN AEROBIC AND ANAEROBIC 10CC   Final   Culture  Setup Time     Final   Value: 09/13/2013 17:14     Performed at [facility]   Culture     Final   Value:        BLOOD CULTURE RECEIVED NO GROWTH TO DATE CULTURE WILL BE HELD FOR 5 DAYS BEFORE ISSUING A FINAL NEGATIVE REPORT     Performed at [facility]   Report Status PENDING   Incomplete  CLOSTRIDIUM DIFFICILE BY PCR     Status: None   Collection Time    09/13/13  2:27 PM      Result Value Ref Range Status   C difficile by pcr NEGATIVE  NEGATIVE Final    Studies/Results: Dg Chest 2 View  09/13/2013   CLINICAL DATA:  Fever.  Weakness.  EXAM: CHEST  2 VIEW  COMPARISON:  CT chest 08/31/2013.   FINDINGS: Mediastinum and hilar structures normal. Heart size is normal. Interim near incomplete clearing of patchy pulmonary infiltrates. No pleural effusion or pneumothorax. No acute bony abnormality.  IMPRESSION: Interim near complete clearing of bilateral patchy pulmonary infiltrates.   Electronically  Signed   ByMarcello Moores  Register   On: 09/13/2013 13:34   Ct Abdomen Pelvis W Contrast  09/13/2013   CLINICAL DATA:  Recent colectomy.  Fever  EXAM: CT ABDOMEN AND PELVIS WITH CONTRAST  TECHNIQUE: Multidetector CT imaging of the abdomen and pelvis was performed using the standard protocol following bolus administration of intravenous contrast.  CONTRAST:  144mL OMNIPAQUE IOHEXOL 300 MG/ML  SOLN  COMPARISON:  08/31/2013  FINDINGS: No pleural effusion identified. Significant interval improvement in aeration to both lower lobes.  There is no focal liver abnormality. Previous cholecystectomy. No biliary dilatation. Normal appearance of the pancreas. The spleen is on unremarkable.  The adrenal glands are both normal. Normal appearance of the kidneys. The right kidney is slightly malrotated. The urinary bladder appears normal. Prostate gland and seminal vesicles are unremarkable.  Mild calcified atherosclerotic disease involves the abdominal aorta. There is no retroperitoneal adenopathy. Multiple small mesenteric lymph nodes are identified. No mesenteric adenopathy. There is no pelvic or inguinal adenopathy. No significant ascites identified within the abdomen or pelvis. No fluid collections identified to suggest abscess.  There is a small hiatal hernia. The stomach is otherwise on unremarkable. Increase caliber of the small bowel loops measures up to 4 cm. The patient is status post right hemicolectomy. The intra colonic anastomosis within the right upper quadrant of the abdomen is patent. Enteric contrast material is identified within the colon up to the rectum.  Review of the visualized osseous structures is  significant for mild degenerative disc disease at L5-S1.  IMPRESSION: 1. Postoperative change compatible with right hemicolectomy and intra colonic anastomosis. 2. Increase caliber of small bowel loops without evidence for high-grade bowel obstruction. Enteric contrast material is seen throughout the small bowel and colon. 3. No evidence for bowel perforation or abscess.   Electronically Signed   By: Kerby Moors M.D.   On: 09/13/2013 17:59    Assessment: The source of his recurrent fever is not apparent. The CT scan does not show any evidence of intra-abdominal infection and the bibasilar infiltrates noted last admission have resolved. He has no evidence of thrombophlebitis in his left arm. His conjunctivitis has resolved and he is C. difficile PCR negative again. He states that he feels better today. I certainly agree with continued observation off of antibiotics pending blood culture results. There is a small possibility that he could have an allergic reaction to his eyedrops which he is still using and I will stop them now.  Plan: 1. Observe off of antibiotics 2. Discontinue eyedrops 3. Await blood culture results  Michel Bickers, MD [facility] for [place] [phone] pager   [phone] cell 09/14/2013, 3:43 PM

## 2013-09-14 NOTE — Progress Notes (Signed)
Cannot understand why the patient continues to have so much diarrhea.  No surgical problem by CT, but patient still having fevers and diarrhea.  C.Diff negative.  Will get ID involved again.  Kathryne Eriksson. Dahlia Bailiff, MD, [place] [phone] [facility]

## 2013-09-15 DIAGNOSIS — R509 Fever, unspecified: Secondary | ICD-10-CM

## 2013-09-15 DIAGNOSIS — E876 Hypokalemia: Secondary | ICD-10-CM

## 2013-09-15 LAB — BASIC METABOLIC PANEL
Anion gap: 13 (ref 5–15)
BUN: 4 mg/dL — AB (ref 6–23)
CALCIUM: 8 mg/dL — AB (ref 8.4–10.5)
CO2: 19 mEq/L (ref 19–32)
Chloride: 99 mEq/L (ref 96–112)
Creatinine, Ser: 0.71 mg/dL (ref 0.50–1.35)
GFR calc Af Amer: >90 mL/min (ref >90)
GLUCOSE: 113 mg/dL — AB (ref 70–99)
Potassium: 4.5 mEq/L (ref 3.7–5.3)
Sodium: 131 mEq/L — ABNORMAL LOW (ref 137–147)

## 2013-09-15 MED ORDER — ZOLPIDEM TARTRATE 5 MG PO TABS: 5.0000 mg | ORAL_TABLET | Freq: Every evening | ORAL | Status: DC | PRN

## 2013-09-15 MED ORDER — ENSURE COMPLETE PO LIQD: 237.0000 mL | Freq: Two times a day (BID) | ORAL | Status: DC

## 2013-09-15 MED ORDER — PROMETHAZINE HCL 12.5 MG PO TABS: 12.5000 mg | ORAL_TABLET | Freq: Four times a day (QID) | ORAL | Status: DC | PRN

## 2013-09-15 MED ORDER — CHOLESTYRAMINE LIGHT 4 G PO PACK
4.0000 g | PACK | Freq: Three times a day (TID) | ORAL | Status: DC
Start: 2013-09-15 — End: 2013-09-15
  Administered 2013-09-15: 4 g via ORAL
  Filled 2013-09-15 (×3): qty 1

## 2013-09-15 MED ORDER — SACCHAROMYCES BOULARDII 250 MG PO CAPS: 250.0000 mg | ORAL_CAPSULE | Freq: Two times a day (BID) | ORAL | Status: AC

## 2013-09-15 MED ORDER — CHOLESTYRAMINE LIGHT 4 G PO PACK: 4.0000 g | PACK | Freq: Three times a day (TID) | ORAL | Status: DC

## 2013-09-15 MED ORDER — FAMOTIDINE 20 MG PO TABS: 20.0000 mg | ORAL_TABLET | Freq: Two times a day (BID) | ORAL | Status: DC

## 2013-09-15 MED ORDER — OXYCODONE-ACETAMINOPHEN 5-325 MG PO TABS: 1.0000 | ORAL_TABLET | Freq: Four times a day (QID) | ORAL | Status: DC | PRN

## 2013-09-15 NOTE — Progress Notes (Signed)
VASCULAR LAB PRELIMINARY  PRELIMINARY  PRELIMINARY  PRELIMINARY  Bilateral lower extremity venous Dopplers completed.    Preliminary report:  There is no DVT or SVT noted in the bilateral lower extremities.   Jemila Camille, RVT 09/15/2013, 9:18 AM

## 2013-09-15 NOTE — Discharge Summary (Signed)
[facility] Discharge Summary   Patient ID: Zachary Kim MRN: [id number] DOB/AGE: Apr 03, 1950 63 y.o.  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 Diarrhea 09/14/2013  . Anorexia 09/14/2013  . Hypokalemia 09/14/2013  . Thrombocytopenia 09/14/2013  . Failure to thrive in adult 09/13/2013  . Tubulovillous adenoma of colon 08/31/2013  . S/P partial colectomy 08/31/2013  . Fever 08/31/2013  . Normocytic anemia 08/31/2013  . Hyperglycemia 08/31/2013  . Hyponatremia 08/31/2013  . Postoperative ileus 08/23/2013  . Cecum mass 07/20/2013  . Mass of appendix 06/29/2013  . Post-operative state 05/14/2013  . S/P cholecystectomy March 20125 04/29/2013    Consultants Dr. Wynelle Cleveland - internal medicine Dr.  Salon - infectious disease  Imaging: Dg Chest 2 View  09/13/2013   CLINICAL DATA:  Fever.  Weakness.  EXAM: CHEST  2 VIEW  COMPARISON:  CT chest 08/31/2013.  FINDINGS: Mediastinum and hilar structures normal. Heart size is normal. Interim near incomplete clearing of patchy pulmonary infiltrates. No pleural effusion or pneumothorax. No acute bony abnormality.  IMPRESSION: Interim near complete clearing of bilateral patchy pulmonary infiltrates.   Electronically Signed   By: Marcello Moores  Register   On: 09/13/2013 13:34   Ct Abdomen Pelvis W Contrast  09/13/2013   CLINICAL DATA:  Recent colectomy.  Fever  EXAM: CT ABDOMEN AND PELVIS WITH CONTRAST  TECHNIQUE: Multidetector CT imaging of the abdomen and pelvis was performed using the standard protocol following bolus administration of intravenous contrast.  CONTRAST:  165mL OMNIPAQUE IOHEXOL 300 MG/ML  SOLN  COMPARISON:  08/31/2013   FINDINGS: No pleural effusion identified. Significant interval improvement in aeration to both lower lobes.  There is no focal liver abnormality. Previous cholecystectomy. No biliary dilatation. Normal appearance of the pancreas. The spleen is on unremarkable.  The adrenal glands are both normal. Normal appearance of the kidneys. The right kidney is slightly malrotated. The urinary bladder appears normal. Prostate gland and seminal vesicles are unremarkable.  Mild calcified atherosclerotic disease involves the abdominal aorta. There is no retroperitoneal adenopathy. Multiple small mesenteric lymph nodes are identified. No mesenteric adenopathy. There is no pelvic or inguinal adenopathy. No significant ascites identified within the abdomen or pelvis. No fluid collections identified to suggest abscess.  There is a small hiatal hernia. The stomach is otherwise on unremarkable. Increase caliber of the small bowel loops measures up to 4 cm. The patient is status post right hemicolectomy. The intra colonic anastomosis within the right upper quadrant of the abdomen is patent. Enteric contrast material is identified within the colon up to the rectum.  Review of the visualized osseous structures is significant for mild degenerative disc disease at L5-S1.  IMPRESSION: 1. Postoperative change compatible with right hemicolectomy and intra colonic anastomosis. 2. Increase caliber of small bowel loops without evidence for high-grade bowel obstruction. Enteric contrast material is seen throughout the small bowel and colon. 3. No evidence for bowel perforation or abscess.   Electronically Signed   By: Kerby Moors M.D.   On: 09/13/2013 17:59    Procedures None this admission  Hospital Course:  63 year old male who is post-op laparoscopic converted to open partial colectomy and appendectomy for a cecal mass(tubulovillous adenoma) on 08/16/13 by Dr. Brantley Stage. He was discharged on  09/03/13 after being re-admitted between 08/23/12  and 09/03/13. At that time he was treated for pneumonia, conjunctivitis, ileus, and diarrhea. He was discharge home to finish up a 7 day course of antibiotics for HCAP and eye drops for conjunctivitis. He says he had 3 good days after discharge where he predominantly felt good and slept most of the days. His antibiotics were discontinued on July 24th. Since the 24th he's felt fatigued, malaise, nausea, chronic diarrhea, anorexia, feverish, chills, soaking night sweats. His wife notes he feels worse over the last few days and diarrhea seams to be getting worse and he's getting weaker and not wanting to do normal activities. His activity level has also diminished. He hasn't tried any home remedies or OTC meds in fear of making things worse. No radicular symptoms or precipitating/alleviating factors. He's been taking phenergan. He was hoping he'd get some antibiotics and be able to go home and get better. He denies CP/SOB, fevers >101.5, constipation, blood in stools, dysuria, abdominal pain or distension.  I ask him and his wife about his stress level. He admits to not sleeping well, being anxious, nervous, frustrated, stressed about his medical conditions as well as what his recent surgery/disability has meant to his family, job, activity level. He admits to worrying excessively and not wanting to get out of bed to do his normal activities.  [place] ticks and chickens and wonders if that could make him sick.  In [facility] a CXR was obtained which showed improved pneumonia, WBC mildly elevated at 10.8, diff normal, lactic acid 2.57, Hgb/Hct improved at 12.3/35.6 and MCV normal, platelets low at 128, glucose 159, urinalysis with protein and calcium oxalate crystals.  A repeat CT scan showed normal post-op changes, no abscess or leak identified. TSH and free T4 were normal.  Mg was normal.  Potassium, Sodium and Chloride were low.  LFT's and renal function normal.     Patient was admitted and was transferred to the floor for further workup/evaluation and treatment.  Dr.  Salon from ID was re-consulted and noted the cause for his recent fevers is unclear.  He recommends continued observation off of antibiotics for now.  He will continue OP workup in his clinic.  We also consulted Dr. Wynelle Cleveland from Internal medicine.  We hoped she would help [place] with the fever/failure to thrive workup.  She recommended we discontinue protonix and ensure as these can cause diarrhea and switch to Pepcid.  She ordered LE duplex to check for DVT but this was negative.  A GI pathogen panel was also ordered and is still in process.  Diet was advanced as tolerated.  We started him on Questran and a probiotic to help with the diarrhea, as well as Ambien to help with sleep.  We IV hydrated him and he is now feeling and eating much better.  On On HD #3, the patient was voiding well, tolerating diet, ambulating well, pain well controlled, vital signs stable, and felt stable for discharge home.  Patient will follow up in our office in 1 weeks and knows to call with questions or concerns. He will need a follow up with his PCP to discuss stress/anxiety/trouble sleeping, and with Dr.  Salon to continue OP workup of his fever.     Medication List    STOP taking these medications       trimethoprim-polymyxin b ophthalmic solution  Commonly known as:  POLYTRIM  TAKE these medications       acetaminophen 500 MG tablet  Commonly known as:  TYLENOL  Take 1,000 mg by mouth every 6 (six) hours as needed for mild pain.     ascorbic acid 500 MG tablet  Commonly known as:  VITAMIN C  Take 500 mg by mouth daily.     CENTRUM SILVER PO  Take 1 tablet by mouth daily.     cholecalciferol 1000 UNITS tablet  Commonly known as:  VITAMIN D  Take 1,000 Units by mouth daily.     cholestyramine light 4 G packet  Commonly known as:  PREVALITE  Take 1 packet (4 g total) by mouth 3 (three) times  daily.     famotidine 20 MG tablet  Commonly known as:  PEPCID  Take 1 tablet (20 mg total) by mouth 2 (two) times daily.     feeding supplement (ENSURE COMPLETE) Liqd  Take 237 mLs by mouth 2 (two) times daily between meals.     ibuprofen 200 MG tablet  Commonly known as:  ADVIL,MOTRIN  Take 200 mg by mouth daily as needed for mild pain.     oxyCODONE-acetaminophen 5-325 MG per tablet  Commonly known as:  PERCOCET/ROXICET  Take 1-2 tablets by mouth every 4 (four) hours as needed for severe pain.     oxyCODONE-acetaminophen 5-325 MG per tablet  Commonly known as:  PERCOCET/ROXICET  Take 1-2 tablets by mouth every 6 (six) hours as needed for severe pain.     pantoprazole 40 MG tablet  Commonly known as:  PROTONIX  Take 1 tablet (40 mg total) by mouth daily.     promethazine 12.5 MG tablet  Commonly known as:  PHENERGAN  Take 1 tablet (12.5 mg total) by mouth every 6 (six) hours as needed for nausea or vomiting.     PROVENTIL HFA 108 (90 BASE) MCG/ACT inhaler  Generic drug:  albuterol  Inhale 1 puff into the lungs every 6 (six) hours as needed for wheezing or shortness of breath.     saccharomyces boulardii 250 MG capsule  Commonly known as:  FLORASTOR  Take 1 capsule (250 mg total) by mouth 2 (two) times daily.     zolpidem 5 MG tablet  Commonly known as:  AMBIEN  Take 1 tablet (5 mg total) by mouth at bedtime as needed for sleep.           Signed: Coralie Keens, [facility] [phone]  09/15/2013, 12:30 PM

## 2013-09-15 NOTE — Discharge Instructions (Signed)
Instructions: 1.  Follow up with your primary care provider to discuss stress level associated with your current medical conditions and talk about trouble sleeping.  Please relay information to him about your current medical issues and recent surgery.  They may want to continue workup as an outpatient. 2.  Follow up with Dr.  Salon from Infectious disease in 2 weeks 3.  Keep your appt with Dr. Brantley Stage on 09/21/13 at 10:30am 4.  Keep hydrated, and try to eat as much as you can 5.  Continue questran and probiotic to help with diarrhea 6.  Continue with ambien to help you sleep 7.  Discontinue protonix and continue pepcid  Diarrhea Diarrhea is frequent loose and watery bowel movements. It can cause you to feel weak and dehydrated. Dehydration can cause you to become tired and thirsty, have a dry mouth, and have decreased urination that often is dark yellow. Diarrhea is a sign of another problem, most often an infection that will not last long. In most cases, diarrhea typically lasts 2-3 days. However, it can last longer if it is a sign of something more serious. It is important to treat your diarrhea as directed by your caregiver to lessen or prevent future episodes of diarrhea. CAUSES  Some common causes include:  Gastrointestinal infections caused by viruses, bacteria, or parasites.  Food poisoning or food allergies.  Certain medicines, such as antibiotics, chemotherapy, and laxatives.  Artificial sweeteners and fructose.  Digestive disorders. HOME CARE INSTRUCTIONS  Ensure adequate fluid intake (hydration): Have 1 cup (8 oz) of fluid for each diarrhea episode. Avoid fluids that contain simple sugars or sports drinks, fruit juices, whole milk products, and sodas. Your urine should be clear or pale yellow if you are drinking enough fluids. Hydrate with an oral rehydration solution that you can purchase at pharmacies, retail stores, and online. You can prepare an oral rehydration solution at  home by mixing the following ingredients together:   - tsp table salt.   tsp baking soda.   tsp salt substitute containing potassium chloride.  1  tablespoons sugar.  1 L (34 oz) of water.  Certain foods and beverages may increase the speed at which food moves through the gastrointestinal (GI) tract. These foods and beverages should be avoided and include:  Caffeinated and alcoholic beverages.  High-fiber foods, such as raw fruits and vegetables, nuts, seeds, and whole grain breads and cereals.  Foods and beverages sweetened with sugar alcohols, such as xylitol, sorbitol, and mannitol.  Some foods may be well tolerated and may help thicken stool including:  Starchy foods, such as rice, toast, pasta, low-sugar cereal, oatmeal, grits, baked potatoes, crackers, and bagels.  Bananas.  Applesauce.  Add probiotic-rich foods to help increase healthy bacteria in the GI tract, such as yogurt and fermented milk products.  Wash your hands well after each diarrhea episode.  Only take over-the-counter or prescription medicines as directed by your caregiver.  Take a warm bath to relieve any burning or pain from frequent diarrhea episodes. SEEK IMMEDIATE MEDICAL CARE IF:   You are unable to keep fluids down.  You have persistent vomiting.  You have blood in your stool, or your stools are black and tarry.  You do not urinate in 6-8 hours, or there is only a small amount of very dark urine.  You have abdominal pain that increases or localizes.  You have weakness, dizziness, confusion, or light-headedness.  You have a severe headache.  Your diarrhea gets worse or does not get  better.  You have a fever or persistent symptoms for more than 2-3 days.  You have a fever and your symptoms suddenly get worse. MAKE SURE YOU:   Understand these instructions.  Will watch your condition.  Will get help right away if you are not doing well or get worse. Document Released: 01/22/2002  Document Revised: 06/18/2013 Document Reviewed: 10/10/2011 [facility] Patient Information 2015 [place], [place]. This information is not intended to replace advice given to you by your health care provider. Make sure you discuss any questions you have with your health care provider.   [facility], [place] [phone]  OPEN ABDOMINAL SURGERY: POST OP INSTRUCTIONS  Always review your discharge instruction sheet given to you by the facility where your surgery was performed.  IF YOU HAVE DISABILITY OR FAMILY LEAVE FORMS, YOU MUST BRING THEM TO THE OFFICE FOR PROCESSING.  PLEASE DO NOT GIVE THEM TO YOUR DOCTOR.  1. A prescription for pain medication may be given to you upon discharge.  Take your pain medication as prescribed, if needed.  If narcotic pain medicine is not needed, then you may take acetaminophen (Tylenol) or ibuprofen (Advil) as needed. 2. Take your usually prescribed medications unless otherwise directed. 3. If you need a refill on your pain medication, please contact your pharmacy. They will contact our office to request authorization.  Prescriptions will not be filled after 5pm or on week-ends. 4. You should follow a light diet the first few days after arrival home, such as soup and crackers, pudding, etc.unless your doctor has advised otherwise. A high-fiber, low fat diet can be resumed as tolerated.   Be sure to include lots of fluids daily. Most patients will experience some swelling and bruising on the chest and neck area.  Ice packs will help.  Swelling and bruising can take several days to resolve 5. Most patients will experience some swelling and bruising in the area of the incision. Ice pack will help. Swelling and bruising can take several days to resolve..  6. It is common to experience some constipation if taking pain medication after surgery.  Increasing fluid intake and taking a stool softener will usually help or prevent this problem from occurring.  A mild  laxative (Milk of Magnesia or Miralax) should be taken according to package directions if there are no bowel movements after 48 hours. 7.  You may have steri-strips (small skin tapes) in place directly over the incision.  These strips should be left on the skin for 7-10 days.  If your surgeon used skin glue on the incision, you may shower in 24 hours.  The glue will flake off over the next 2-3 weeks.  Any sutures or staples will be removed at the office during your follow-up visit. You may find that a light gauze bandage over your incision may keep your staples from being rubbed or pulled. You may shower and replace the bandage daily. 8. ACTIVITIES:  You may resume regular (light) daily activities beginning the next day--such as daily self-care, walking, climbing stairs--gradually increasing activities as tolerated.  You may have sexual intercourse when it is comfortable.  Refrain from any heavy lifting or straining until approved by your doctor. a. You may drive when you no longer are taking prescription pain medication, you can comfortably wear a seatbelt, and you can safely maneuver your car and apply brakes b. Return to Work: ___________________________________ 15. You should see your doctor in the office for a follow-up appointment  approximately two weeks after your surgery.  Make sure that you call for this appointment within a day or two after you arrive home to insure a convenient appointment time. OTHER INSTRUCTIONS:  _____________________________________________________________ _____________________________________________________________  WHEN TO CALL YOUR DOCTOR: 1. Fever over 101.0 2. Inability to urinate 3. Nausea and/or vomiting 4. Extreme swelling or bruising 5. Continued bleeding from incision. 6. Increased pain, redness, or drainage from the incision. 7. Difficulty swallowing or breathing 8. Muscle cramping or spasms. 9. Numbness or tingling in hands or feet or around lips.  The  clinic staff is available to answer your questions during regular business hours.  Please dont hesitate to call and ask to speak to one of the nurses if you have concerns.  For further questions, please visit [email or web address]

## 2013-09-15 NOTE — Progress Notes (Signed)
Patient ID: Zachary Kim, male   DOB: [birth year], 63 y.o.   MRN: [id number]         [facility]    Date of Admission:  09/13/2013   off antibiotics for 8 days  Active Problems:   Tubulovillous adenoma of colon   S/P partial colectomy   S/P cholecystectomy March 20125   Normocytic anemia   Hyperglycemia   Hyponatremia   Failure to thrive in adult   Diarrhea   Anorexia   Hypokalemia   Thrombocytopenia   . cholestyramine light  4 g Oral TID  . enoxaparin (LOVENOX) injection  40 mg Subcutaneous Q24H  . famotidine  20 mg Oral BID  . feeding supplement (ENSURE COMPLETE)  237 mL Oral BID BM  . potassium chloride  20 mEq Oral BID  . saccharomyces boulardii  250 mg Oral BID    Subjective: He states that he is feeling better today. He still has had some nausea but was able to eat a good breakfast. He has had 3 bouts of diarrhea since breakfast. Review of Systems: Pertinent items are noted in HPI.  Past Medical History  Diagnosis Date  . Hyperlipidemia   . Arthritis   . GERD (gastroesophageal reflux disease)   . HOH (hard of hearing)   . Gallstones   . Asthma     if have cold  . Pneumonia     hx    History  Substance Use Topics  . Smoking status: Former Smoker -- 2.00 packs/day for 12 years    Types: Cigarettes    Quit date: 04/10/1983  . Smokeless tobacco: Current User    Types: Chew     Comment: chew tobacco, tobacco info given 06/29/13  . Alcohol Use: 1.8 oz/week    3 Cans of beer per week     Comment: daily    Family History  Problem Relation Age of Onset  . Colon cancer Neg Hx   . Diabetes Mother   . Heart disease Father     had valve replacement    Allergies  Allergen Reactions  . Tape Other (See Comments)    Adhesive tape causes blistered skin    Objective: Temp:  [97.6 F (36.4 C)-101 F (38.3 C)] 97.8 F (36.6 C) (08/01 0712) Pulse Rate:  [72-96] 90 (08/01 0520) Resp:  [16] 16 (08/01 0520) BP:  (125-140)/(56-68) 140/66 mmHg (08/01 0520) SpO2:  [97 %-100 %] 100 % (08/01 0520)  General: He is in good spirits up walking in his room Skin: No rash Eyes: No conjunctivitis Lungs: Clear Cor: Regular S1 and S2 no murmurs Abdomen: Soft and distended. Incisions healed well. No significant tenderness Joints and extremities: No acute abnormalities  Lab Results Lab Results  Component Value Date   WBC 11.3* 09/14/2013   HGB 10.2* 09/14/2013   HCT 29.3* 09/14/2013   MCV 90.2 09/14/2013   PLT 138* 09/14/2013    Lab Results  Component Value Date   CREATININE 0.71 09/15/2013   BUN 4* 09/15/2013   NA 131* 09/15/2013   K 4.5 09/15/2013   CL 99 09/15/2013   CO2 19 09/15/2013    Lab Results  Component Value Date   ALT 26 09/14/2013   AST 28 09/14/2013   ALKPHOS 69 09/14/2013   BILITOT 0.6 09/14/2013      Microbiology: Recent Results (from the past 240 hour(s))  CULTURE, BLOOD (ROUTINE X 2)     Status: None   Collection Time  09/13/13  1:15 PM      Result Value Ref Range Status   Specimen Description BLOOD RIGHT ANTECUBITAL   Final   Special Requests BOTTLES DRAWN AEROBIC AND ANAEROBIC 10CC   Final   Culture  Setup Time     Final   Value: 09/13/2013 17:14     Performed at [facility]   Culture     Final   Value:        BLOOD CULTURE RECEIVED NO GROWTH TO DATE CULTURE WILL BE HELD FOR 5 DAYS BEFORE ISSUING A FINAL NEGATIVE REPORT     Performed at [facility]   Report Status PENDING   Incomplete  CULTURE, BLOOD (ROUTINE X 2)     Status: None   Collection Time    09/13/13  1:45 PM      Result Value Ref Range Status   Specimen Description BLOOD RIGHT ANTECUBITAL   Final   Special Requests BOTTLES DRAWN AEROBIC AND ANAEROBIC 10CC   Final   Culture  Setup Time     Final   Value: 09/13/2013 17:14     Performed at [facility]   Culture     Final   Value:        BLOOD CULTURE RECEIVED NO GROWTH TO DATE CULTURE WILL BE HELD FOR 5 DAYS BEFORE ISSUING A FINAL NEGATIVE  REPORT     Performed at [facility]   Report Status PENDING   Incomplete  CLOSTRIDIUM DIFFICILE BY PCR     Status: None   Collection Time    09/13/13  2:27 PM      Result Value Ref Range Status   C difficile by pcr NEGATIVE  NEGATIVE Final    Studies/Results: Dg Chest 2 View  09/13/2013   CLINICAL DATA:  Fever.  Weakness.  EXAM: CHEST  2 VIEW  COMPARISON:  CT chest 08/31/2013.  FINDINGS: Mediastinum and hilar structures normal. Heart size is normal. Interim near incomplete clearing of patchy pulmonary infiltrates. No pleural effusion or pneumothorax. No acute bony abnormality.  IMPRESSION: Interim near complete clearing of bilateral patchy pulmonary infiltrates.   Electronically Signed   By: Marcello Moores  Register   On: 09/13/2013 13:34   Ct Abdomen Pelvis W Contrast  09/13/2013   CLINICAL DATA:  Recent colectomy.  Fever  EXAM: CT ABDOMEN AND PELVIS WITH CONTRAST  TECHNIQUE: Multidetector CT imaging of the abdomen and pelvis was performed using the standard protocol following bolus administration of intravenous contrast.  CONTRAST:  187mL OMNIPAQUE IOHEXOL 300 MG/ML  SOLN  COMPARISON:  08/31/2013  FINDINGS: No pleural effusion identified. Significant interval improvement in aeration to both lower lobes.  There is no focal liver abnormality. Previous cholecystectomy. No biliary dilatation. Normal appearance of the pancreas. The spleen is on unremarkable.  The adrenal glands are both normal. Normal appearance of the kidneys. The right kidney is slightly malrotated. The urinary bladder appears normal. Prostate gland and seminal vesicles are unremarkable.  Mild calcified atherosclerotic disease involves the abdominal aorta. There is no retroperitoneal adenopathy. Multiple small mesenteric lymph nodes are identified. No mesenteric adenopathy. There is no pelvic or inguinal adenopathy. No significant ascites identified within the abdomen or pelvis. No fluid collections identified to suggest abscess.   There is a small hiatal hernia. The stomach is otherwise on unremarkable. Increase caliber of the small bowel loops measures up to 4 cm. The patient is status post right hemicolectomy. The intra colonic anastomosis within the right upper quadrant of the  abdomen is patent. Enteric contrast material is identified within the colon up to the rectum.  Review of the visualized osseous structures is significant for mild degenerative disc disease at L5-S1.  IMPRESSION: 1. Postoperative change compatible with right hemicolectomy and intra colonic anastomosis. 2. Increase caliber of small bowel loops without evidence for high-grade bowel obstruction. Enteric contrast material is seen throughout the small bowel and colon. 3. No evidence for bowel perforation or abscess.   Electronically Signed   By: Kerby Moors M.D.   On: 09/13/2013 17:59    Assessment: The cause of his recent fever remains unclear. 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 I do not see any clear evidence of tick borne illness or other zoonotic infection. I favor continued observation off of antibiotics for now. He is very eager to go home. If he does go home and continues to have fever I will try to arrange outpatient followup in her clinic.  Plan: 1. Continued observation off of antibiotics  Michel Bickers, MD [facility] for Zachary Kim [phone] pager   [phone] cell 09/15/2013, 12:15 PM

## 2013-09-15 NOTE — Progress Notes (Signed)
Patient ID: Zachary Kim, male   DOB: [birth year], 63 y.o.   MRN: [id number] [facility] Surgery Progress Note     Subjective: Pt still having fevers.  Had more diarrhea yesterday due to Ct contrast.    Objective: Vital signs in last 24 hours: Temp:  [97.6 F (36.4 C)-101 F (38.3 C)] 97.8 F (36.6 C) (08/01 0712) Pulse Rate:  [72-96] 90 (08/01 0520) Resp:  [16] 16 (08/01 0520) BP: (125-140)/(56-68) 140/66 mmHg (08/01 0520) SpO2:  [97 %-100 %] 100 % (08/01 0520) Last BM Date: 09/15/13  Intake/Output from previous day: 07/31 0701 - 08/01 0700 In: 1875 [P.O.:600; I.V.:1275] Out: -  Intake/Output this shift: Total I/O In: 240 [P.O.:240] Out: -   PE: Gen:  Alert, NAD Card:  RRR Pulm:  Breathing comfortably Abd: Soft, NT/ND Ext:  No erythema, edema, or tenderness  Lab Results:   Recent Labs  09/13/13 1200 09/14/13 0435  WBC 10.8* 11.3*  HGB 12.3* 10.2*  HCT 35.6* 29.3*  PLT 128* 138*   BMET  Recent Labs  09/14/13 1420 09/15/13 0328  NA 131* 131*  K 3.7 4.5  CL 99 99  CO2 21 19  GLUCOSE 144* 113*  BUN 5* 4*  CREATININE 0.84 0.71  CALCIUM 8.0* 8.0*   PT/INR No results found for this basename: LABPROT, INR,  in the last 72 hours CMP     Component Value Date/Time   NA 131* 09/15/2013 0328   K 4.5 09/15/2013 0328   CL 99 09/15/2013 0328   CO2 19 09/15/2013 0328   GLUCOSE 113* 09/15/2013 0328   BUN 4* 09/15/2013 0328   CREATININE 0.71 09/15/2013 0328   CALCIUM 8.0* 09/15/2013 0328   PROT 5.4* 09/14/2013 0435   ALBUMIN 2.5* 09/14/2013 0435   AST 28 09/14/2013 0435   ALT 26 09/14/2013 0435   ALKPHOS 69 09/14/2013 0435   BILITOT 0.6 09/14/2013 0435   GFRNONAA >90 09/15/2013 0328   GFRAA >90 09/15/2013 0328   Lipase     Component Value Date/Time   LIPASE 19 08/23/2013 0859       Studies/Results: Dg Chest 2 View  09/13/2013   CLINICAL DATA:  Fever.  Weakness.  EXAM: CHEST  2 VIEW  COMPARISON:  CT chest 08/31/2013.  FINDINGS: Mediastinum and hilar structures  normal. Heart size is normal. Interim near incomplete clearing of patchy pulmonary infiltrates. No pleural effusion or pneumothorax. No acute bony abnormality.  IMPRESSION: Interim near complete clearing of bilateral patchy pulmonary infiltrates.   Electronically Signed   By: Marcello Moores  Register   On: 09/13/2013 13:34   Ct Abdomen Pelvis W Contrast  09/13/2013   CLINICAL DATA:  Recent colectomy.  Fever  EXAM: CT ABDOMEN AND PELVIS WITH CONTRAST  TECHNIQUE: Multidetector CT imaging of the abdomen and pelvis was performed using the standard protocol following bolus administration of intravenous contrast.  CONTRAST:  171mL OMNIPAQUE IOHEXOL 300 MG/ML  SOLN  COMPARISON:  08/31/2013  FINDINGS: No pleural effusion identified. Significant interval improvement in aeration to both lower lobes.  There is no focal liver abnormality. Previous cholecystectomy. No biliary dilatation. Normal appearance of the pancreas. The spleen is on unremarkable.  The adrenal glands are both normal. Normal appearance of the kidneys. The right kidney is slightly malrotated. The urinary bladder appears normal. Prostate gland and seminal vesicles are unremarkable.  Mild calcified atherosclerotic disease involves the abdominal aorta. There is no retroperitoneal adenopathy. Multiple small mesenteric lymph nodes are identified. No mesenteric adenopathy. There is no pelvic  or inguinal adenopathy. No significant ascites identified within the abdomen or pelvis. No fluid collections identified to suggest abscess.  There is a small hiatal hernia. The stomach is otherwise on unremarkable. Increase caliber of the small bowel loops measures up to 4 cm. The patient is status post right hemicolectomy. The intra colonic anastomosis within the right upper quadrant of the abdomen is patent. Enteric contrast material is identified within the colon up to the rectum.  Review of the visualized osseous structures is significant for mild degenerative disc disease at  L5-S1.  IMPRESSION: 1. Postoperative change compatible with right hemicolectomy and intra colonic anastomosis. 2. Increase caliber of small bowel loops without evidence for high-grade bowel obstruction. Enteric contrast material is seen throughout the small bowel and colon. 3. No evidence for bowel perforation or abscess.   Electronically Signed   By: Kerby Moors M.D.   On: 09/13/2013 17:59    Anti-infectives: Anti-infectives   None       Assessment/Plan Nausea/Diarrhea  Fatigue/Malaise  Low grade temps  Anorexia Night sweats/chills ?Adjustment disorder due to medical problems Recent PNA - resolved on CXR Hypokalemia  Plan: 1.  CT scan has normal post-op changes, no concern for leak or abscess etc.   2.  ID seeing to evaluate continued fever.  GI pathogen pending.   3.  Hypovolemia, hyponatremia.  Check urine chloride.   4. Hypokalemia improved.   5.  Unclear why patient is having so much trouble.  Appreciate input from ID.   6.  Try Lucrezia Starch for diarrhea.   Pt may go home later today if doing OK.  He has had significant serious complications ruled out.  Dr. Megan Salon evaluating as well.  He states that most likely further fever workup can be completed as outpt. Team to check on patient later today.      LOS: 2 days    Anjolie Majer 09/15/2013, 11:27 AM

## 2013-09-16 NOTE — Discharge Summary (Signed)
Seen, agree with above. Fevers concerning, but all studies have been negative for worrisome diagnoses.  Dr. Megan Salon feels that continued fever workup can be safely done as outpatient.

## 2013-09-18 ENCOUNTER — Telehealth (INDEPENDENT_AMBULATORY_CARE_PROVIDER_SITE_OTHER): Payer: Self-pay

## 2013-09-18 LAB — GI PATHOGEN PANEL BY PCR, STOOL
C difficile toxin A/B: NEGATIVE
Campylobacter by PCR: NEGATIVE
Cryptosporidium by PCR: NEGATIVE
E coli (ETEC) LT/ST: NEGATIVE
E coli (STEC): NEGATIVE
E coli 0157 by PCR: NEGATIVE
G LAMBLIA BY PCR: NEGATIVE
Norovirus GI/GII: NEGATIVE
ROTAVIRUS A BY PCR: NEGATIVE
SHIGELLA BY PCR: NEGATIVE
Salmonella by PCR: NEGATIVE

## 2013-09-18 NOTE — Telephone Encounter (Signed)
Pt's wife calling for results of blood cultures drawn while he was in the ED.  Results are still preliminary with no growth yet.  They will keep them for five more days.  Message was left with Dr. Hale Bogus office to contact the patient's wife to follow up from his hospital stay.  Discharge summary from Dr. Barry Dienes stated that Infectious Disease would be following patient on an out patient basis to treat his fever.  Wife has been made aware of this as well and was given telephone number for Dr. Megan Salon at [facility].  Patient will see Dr. Brantley Stage this Friday.

## 2013-09-19 LAB — CULTURE, BLOOD (ROUTINE X 2)
CULTURE: NO GROWTH
Culture: NO GROWTH

## 2013-09-19 NOTE — Telephone Encounter (Signed)
Pt's wife called again to check and see if the blood cultures were back yet.  After checking the chart and reading [facility] message below, I reemphasized to the pt's wife that we have not received them as of yet.  I reminded her of his f/u appt with Dr. Brantley Stage this Friday 09-21-13.  Pt's wife states that she is just concerned that the pt is not getting any better.  Possible dehydrating even after taking in plenty of fluids but she said that the pt did not want to go back to the ER.  I did let pt's wife know again that a message has been left with Dr. Hale Bogus office, but she states that they haven't contacted her as of yet.  She did state that she would try to call their office.  Anderson Malta

## 2013-09-20 ENCOUNTER — Telehealth: Payer: Self-pay | Admitting: *Deleted

## 2013-09-20 ENCOUNTER — Telehealth (INDEPENDENT_AMBULATORY_CARE_PROVIDER_SITE_OTHER): Payer: Self-pay

## 2013-09-20 ENCOUNTER — Encounter (INDEPENDENT_AMBULATORY_CARE_PROVIDER_SITE_OTHER): Payer: BC Managed Care – PPO | Admitting: General Surgery

## 2013-09-20 NOTE — Telephone Encounter (Signed)
I have put a call in to Dr Hale Bogus office requesting appt. Maudie Mercury will call me back today with that information.

## 2013-09-20 NOTE — Telephone Encounter (Signed)
Recevied call from [facility] and from [place] at [facility] [employer]. Patient's wife states she has been trying to get in touch with Dr. Megan Salon as the patient has been getting worse since discharge on 09/15/13. Since Saturday, he has had increasing symptoms including several bouts of diarrhea daily; fevers 99-100 during the day, up to 101 at night, despite taking tylenol around the clock; general lethargy and nausea. He has been eating toast, crackers and rice; drinking gatorade, pedialyte.  Patient has been taking nausea pills, pepcid, probiotics, prevalite powders. The symptoms are not alleviated. Per wife, patient has lost 15 pounds since he was admitted to the hospital. Please contact the patient's wife.  She fears he may need to be admitted, does not want to go to the ED only to be discharged back home without assistance. Wife: Jackelyn Poling [phone]. Landis Gandy, RN

## 2013-09-20 NOTE — Telephone Encounter (Signed)
Pt cancelled urge appt and has got to ED at [facility].

## 2013-09-20 NOTE — Telephone Encounter (Signed)
Called pt wife back with appt to see Dr Megan Salon Monday at 11:30 at address [address]. She states pt cannot wait to be seen. He does not see Dr Brantley Stage until tomorrow. Pt still has fever, diarrhea and dehydration she believes. They do not want to go to ED. They asked if someone in our office could see him today. Made urgent office appt with Dr Zella Richer for this afternoon.

## 2013-09-20 NOTE — Telephone Encounter (Signed)
Message copied by Carlene Coria on Thu Sep 20, 2013  2:33 PM ------      Message from: Joya San      Created: Thu Sep 20, 2013  1:09 PM       Just Zachary Kim he is at [facility] and might be admitted. ------

## 2013-09-20 NOTE — Telephone Encounter (Signed)
Pt's wife is calling back to see if the blood culture results were back. After checking there has not been any growth in 5 days. I reminded her of his f/u appt with Dr. Brantley Stage this Friday 09-21-13. Pt's wife states that she is just concerned that the pt is not getting any better, she states that he has been running a fever of 100.0 for several days. Wife states that he has also been having continues diarrhea, anytime he drinks anything it goes straight through him. Wife states that if there isn't anything that we can do for him they are going to take him to [facility] or [facility]. Informed the wife that I would send a message to Dr Brantley Stage. Wife states that she also has been calling Dr Hale Bogus office and they have not returned any of her calls.

## 2013-09-21 ENCOUNTER — Other Ambulatory Visit (INDEPENDENT_AMBULATORY_CARE_PROVIDER_SITE_OTHER): Payer: Self-pay | Admitting: *Deleted

## 2013-09-21 ENCOUNTER — Encounter (INDEPENDENT_AMBULATORY_CARE_PROVIDER_SITE_OTHER): Payer: Self-pay | Admitting: Surgery

## 2013-09-21 ENCOUNTER — Ambulatory Visit (INDEPENDENT_AMBULATORY_CARE_PROVIDER_SITE_OTHER): Payer: BC Managed Care – PPO | Admitting: Surgery

## 2013-09-21 VITALS — BP 134/76 | HR 82 | Resp 16 | Ht 74.0 in | Wt 222.8 lb

## 2013-09-21 DIAGNOSIS — Z9889 Other specified postprocedural states: Secondary | ICD-10-CM

## 2013-09-21 DIAGNOSIS — Z9049 Acquired absence of other specified parts of digestive tract: Secondary | ICD-10-CM

## 2013-09-21 NOTE — Patient Instructions (Signed)
Will try to set up [place]. Increase imodium to two caplets twice a day Return in 7 - 10 days.

## 2013-09-21 NOTE — Progress Notes (Signed)
Patient returns to clinic today. He readmitted last week the hospital due to dehydration and intermittent fever. He still having multiple bowel movements a day and was seen yesterday at [facility] where nothing new was found. He feels better after IV fluids were given. He is on Questran but it is not slowing down his bowel function. He continues to have fever at night and has been seen twice by infectious disease without etiology identified. Today he is doing okay he is afebrile currently. He has a poor appetite but he is eating some. He has had 6 bowel movements already today and to previous C. Difficile test negative. He is following up Monday with infectious disease. Denies abdominal pain. Temperature gets around 100.5-101 point 5 at night. Tylenol helps.  Exam: Fatigued male.  Abdomen: Soft nontender with mild distention. No rebound guarding. Well-healed incision.  Impression: 5 weeks out right hemicolectomy complicated by ileus, fever of unknown origin and failure to thrive with postop diarrhea  Plan: Arrange for home health to see him 3 times a week for IV fluid therapy. Increase Imodium to twice a day slowly. Blood cultures are all negative. Followup infectious disease next week. Try her best to keep him out of hospital. Return in 10-14 days.

## 2013-09-24 ENCOUNTER — Ambulatory Visit (INDEPENDENT_AMBULATORY_CARE_PROVIDER_SITE_OTHER): Payer: BC Managed Care – PPO | Admitting: Internal Medicine

## 2013-09-24 ENCOUNTER — Encounter: Payer: Self-pay | Admitting: Internal Medicine

## 2013-09-24 VITALS — BP 126/73 | HR 84 | Temp 98.4°F | Ht 74.0 in | Wt 222.0 lb

## 2013-09-24 DIAGNOSIS — R197 Diarrhea, unspecified: Secondary | ICD-10-CM

## 2013-09-24 NOTE — Assessment & Plan Note (Addendum)
I don't have a good explanation for the diarrhea with fever but no evidence for infection.  I do not feel empiric antibiotics are indicated.  No other tests indicated.  I have recommended he increase immodium to 2 tabs 2-3 times daily.  I will discuss with Dr. Brantley Stage if GI input of any utility at this point.  He is already taking Florastor.

## 2013-09-24 NOTE — Progress Notes (Signed)
   Subjective:    Patient ID: Zachary Kim, male    DOB: 1950/04/01, 63 y.o.   MRN: [id number]  HPI Here for hospital follow up.  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  He underwent open resection July 2nd of terminal ileum and descending colong and since that time has had persistent problems with diarrhea, going at least 15 times per day.  Stool described as watery.  No blood, no mucous.  He was recently hospitalized with persistent diarrhea and also has been febrile.  All tests including multiple stool PCR and C diff have been negative.  He has been taking immodium and recently started on IV hydration at home by his surgeon due to the significant dehydration.  His temp has been up to 101, particularly in the evenings.  He went to [facility] ED on 8/6 and they hydrated him and repeat stool tests, all were negative.  He is here with his wife and both are frustrated that there is no answer.  Ask about seeing GI.  Ask about doing an MRI.  His weight is about 8 lbs lower than in June.     Review of Systems  Constitutional: Positive for fever and fatigue. Negative for chills.  Gastrointestinal: Positive for diarrhea. Negative for nausea and abdominal pain.  Skin: Negative for rash.  Neurological: Negative for dizziness and light-headedness.       Objective:   Physical Exam  Constitutional: He appears well-developed and well-nourished.  Tired appearing  Eyes: No scleral icterus.  Cardiovascular: Normal rate, regular rhythm and normal heart sounds.   No murmur heard. Pulmonary/Chest: Effort normal and breath sounds normal. No respiratory distress. He has no wheezes.  Lymphadenopathy:    He has no cervical adenopathy.  Skin: No rash noted.          Assessment & Plan:

## 2013-09-25 ENCOUNTER — Telehealth: Payer: Self-pay | Admitting: Gastroenterology

## 2013-09-25 ENCOUNTER — Telehealth (INDEPENDENT_AMBULATORY_CARE_PROVIDER_SITE_OTHER): Payer: Self-pay | Admitting: *Deleted

## 2013-09-25 ENCOUNTER — Telehealth (INDEPENDENT_AMBULATORY_CARE_PROVIDER_SITE_OTHER): Payer: Self-pay

## 2013-09-25 NOTE — Telephone Encounter (Signed)
[facility] reports a 9 lb wt loss on this patient in 1 week. They have had to give him fluids and have sent him to the ER. Appointment made.

## 2013-09-25 NOTE — Telephone Encounter (Signed)
[place] with [facility] called regarding pt.  She states that they made their 1st home visit 09-22-13 and started the IV and taught the pt and his wife how to do the saline.  V[place]ont states that instead of doing it 3 times a week like they advised her, that the wife done it Sat, Sun, and Monday.  Wife called [facility] this morning stating that pt is still real weak and having 8-10 bowel movements a day.  V[place]ont states that maybe pt would benefit from having 1 liter of saline a day instead of 2 liters 3 times a week.  She states that the pt is a hard stick, and that if pt is going to be on this for a while, she recommends a PICC line possibly being put in.    Please advise!  Anderson Malta

## 2013-09-25 NOTE — Telephone Encounter (Signed)
Pts home health agency called about orders and appt for GI. Appt was given for Friday and she will call them with appt

## 2013-09-25 NOTE — Telephone Encounter (Signed)
Called Dr Deatra Ina office and the first avail appt they had wasn't until Oct. They took a triage message and will call me back with a sooner appt if possible.

## 2013-09-25 NOTE — Telephone Encounter (Signed)
LMOM> Per Dr Brantley Stage he would like pt to get back into to see Dr Deatra Ina to see if they can help or have any suggestions to help with his diarrhea. If they need [place] to help get appt we would be glad to .

## 2013-09-25 NOTE — Telephone Encounter (Signed)
Informed pt of message below. Wife would like Sharyn Lull to call Dr Kelby Fam office and make this appt if she could. Informed pt that I would send Sharyn Lull the message letting her know.  Wife verbalized understanding.

## 2013-09-26 ENCOUNTER — Other Ambulatory Visit (INDEPENDENT_AMBULATORY_CARE_PROVIDER_SITE_OTHER): Payer: Self-pay | Admitting: *Deleted

## 2013-09-26 DIAGNOSIS — E860 Dehydration: Secondary | ICD-10-CM

## 2013-09-26 NOTE — Telephone Encounter (Signed)
THAT'S FINE SET UP FOR OUTPATIENT PICC.  HE HAS BEEN REFERRED TO GI MEDICINE.  TELL HIS WIFE THAT THEY CAN TAKE 2 CAPLETS OF IMODIUM 3 TIMES A DAY IF NOT DOING SO AT THIS POINT.  CAN INCREASE IMODIUM TO 2 CAPLETS EVERY 6 HOURS IF THIS DOES NOT WORK.

## 2013-09-26 NOTE — Telephone Encounter (Signed)
Called pt after speaking with [place] with [facility].  Pt is scheduled to have PICC line insert 09-27-13 @ [facility].  Pt agrees with the appt.  I did explain to the pt that he can increase his Imodium to 2 caplets 3 times a day.  Pt stated that he was only taking 1 at a time, but he did state that he will increase it to 2.  Pt said he was not as bad as he has been, but he was still weak.  I advised pt to go have his PICC line put in and to go see Dr. Deatra Ina on Friday and hopefully we can get him back to where he was.  Pt verbalized understanding.  Anderson Malta

## 2013-09-27 ENCOUNTER — Ambulatory Visit (HOSPITAL_COMMUNITY)
Admission: RE | Admit: 2013-09-27 | Discharge: 2013-09-27 | Disposition: A | Discharge status: Home / Self Care | Payer: BC Managed Care – PPO | Source: Ambulatory Visit | Attending: Surgery | Admitting: Surgery

## 2013-09-27 ENCOUNTER — Telehealth (INDEPENDENT_AMBULATORY_CARE_PROVIDER_SITE_OTHER): Payer: Self-pay

## 2013-09-27 ENCOUNTER — Other Ambulatory Visit (INDEPENDENT_AMBULATORY_CARE_PROVIDER_SITE_OTHER): Payer: Self-pay | Admitting: Surgery

## 2013-09-27 DIAGNOSIS — E860 Dehydration: Secondary | ICD-10-CM

## 2013-09-27 HISTORY — PX: PERIPHERALLY INSERTED CENTRAL CATHETER INSERTION: SHX2221

## 2013-09-27 MED ORDER — LIDOCAINE HCL 1 % IJ SOLN
INTRAMUSCULAR | Status: AC
  Filled 2013-09-27: qty 20

## 2013-09-27 NOTE — Telephone Encounter (Signed)
Called to give order for the 1 liter of saline per Dr Brantley Stage today.

## 2013-09-27 NOTE — Telephone Encounter (Signed)
Pt's wife calling in b/c she is concerned about pt not getting fluids since Monday. The pt received his last 2 bags of IV fluids on Monday. The pt received his PICC line today and has appt with Dr Deatra Ina tomorrow. The wife called home health to see about bringing a bag of fluid to the home today but they will not without an order.   I called Dr Brantley Stage to get an order for the saline. Per Dr Brantley Stage the pt may have 1 liter of saline today and make sure the pt is following his directions on taking Immodium 2 capsules TID. If the pt is taking the Immodium 2 capsules TID then he can go up to taking 2 capsules QID per Dr Brantley Stage. The saline order may change with pt seeing Dr Deatra Ina tomorrow.  I notified pt's wife that Dr Brantley Stage did give the saline order so I will call order into home health. I advised about the instructions with the Immodium. The wife said the pt has been doing Dr Cornett's recommendations and she will be increasing the Immodium to QID.

## 2013-09-27 NOTE — Procedures (Signed)
RUE PICC 43 cm SVC RA No comp

## 2013-09-28 ENCOUNTER — Ambulatory Visit (INDEPENDENT_AMBULATORY_CARE_PROVIDER_SITE_OTHER): Payer: BC Managed Care – PPO | Admitting: Gastroenterology

## 2013-09-28 ENCOUNTER — Encounter: Payer: Self-pay | Admitting: Gastroenterology

## 2013-09-28 ENCOUNTER — Other Ambulatory Visit (INDEPENDENT_AMBULATORY_CARE_PROVIDER_SITE_OTHER): Payer: BC Managed Care – PPO

## 2013-09-28 VITALS — BP 108/60 | HR 80 | Ht 72.0 in | Wt 219.1 lb

## 2013-09-28 DIAGNOSIS — Z9889 Other specified postprocedural states: Secondary | ICD-10-CM

## 2013-09-28 DIAGNOSIS — R634 Abnormal weight loss: Secondary | ICD-10-CM

## 2013-09-28 DIAGNOSIS — Z9049 Acquired absence of other specified parts of digestive tract: Secondary | ICD-10-CM

## 2013-09-28 DIAGNOSIS — R509 Fever, unspecified: Secondary | ICD-10-CM | POA: Insufficient documentation

## 2013-09-28 DIAGNOSIS — D126 Benign neoplasm of colon, unspecified: Secondary | ICD-10-CM

## 2013-09-28 DIAGNOSIS — R197 Diarrhea, unspecified: Secondary | ICD-10-CM

## 2013-09-28 LAB — IGA: IGA: 225 mg/dL (ref 68–378)

## 2013-09-28 MED ORDER — NA SULFATE-K SULFATE-MG SULF 17.5-3.13-1.6 GM/177ML PO SOLN: ORAL | Status: DC

## 2013-09-28 NOTE — Patient Instructions (Addendum)
You have been scheduled for a colonoscopy. Please follow written instructions given to you at your visit today.  Please pick up your prep kit at the pharmacy within the next 1-3 days. If you use inhalers (even only as needed), please bring them with you on the day of your procedure. Your physician has requested that you go to www.startemmi.com and enter the access code given to you at your visit today. This web site gives a general overview about your procedure. However, you should still follow specific instructions given to you by our office regarding your preparation for the procedure.  Your physician has requested that you go to the basement for the following lab work before leaving today: Fecal Leukocytes  Pancreatic Elastase Fecal  TTG IGA  We have sent the following medications to your pharmacy for you to pick up at your convenience: Lomotil, take one to two tablets by mouth every six hours as needed for diarrhea   Stop Imodium

## 2013-09-28 NOTE — Telephone Encounter (Signed)
Wife calling in from Dr Kelby Fam office b/c they are telling them while they are there they need to call our office to get the fluid orders for the PICC line. Pt saw Janett Billow, [place] with Dr Deatra Ina b/c Dr Deatra Ina not in the office this week. Wife is worried about the pt not getting an order put back into home health for the supplies to get delivered today.   I called and spoke to [place], [place] with Dr Deatra Ina about the pt and what they are going to advise for the pt. Janett Billow, [place] did discuss the pt with Dr Hilarie Fredrickson with the recommendations of getting a colonoscopy, labs getting drawn today, STOP Immodium, start Lomotil, and continue Questran. Jessica,PA said they are not going to give order on the fluid b/c they are not the one's who gave the order for the PICC line. The pt is scheduled for a colonoscopy with Dr Deatra Ina on 10/10/2013. I advised that I would check with urgent office doctor Dr Excell Seltzer about getting the standard order for fluids back into home health until Dr Cornett's comes back next week.  Spoke to Dr Excell Seltzer about the pt and what we were needing until Dr Brantley Stage comes back. Dr Excell Seltzer gave the ok for standard orders of 2 liters of saline 3x's a week. I called Sonia Side the Pharmacist with home health [phone] to give the verbal order on the fluids. The supplies will be delivered tonight.  Called pt's wife back to notify her the fluid orders have been put back into the pharmacy and they will be delivered tonight. Pt has appt with Dr Brantley Stage on 10/03/2013.

## 2013-09-28 NOTE — Progress Notes (Signed)
09/28/2013 Zachary Kim [id number] 08-May-1950   History of Present Illness:  [name] who underwent open cholecystectomy in March 2015. He subsequently developed an ileus and underwent CT scan of the abdomen and pelvis on March 15, which revealed an abnormally dilated appendix to 2.4 cm with internal hypodensity, differential considerations including a mucocele, mucinous appendiceal adenocarcinoma, or possibly appendicitis but appendicitis was not favored because no wall thickening was visualized. He was apparently recommended to undergo a colonoscopy, but never proceeded with that. He was seen again by Dr. Brantley Stage, and he recommended a repeat CT scan and referral to our office for colonoscopy. 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  He was seen in our office on 06/29/2013 and was scheduled for colonoscopy.  That was performed on May 26 at which time he was found to have a mass at the IC valve, cecal polyps, and rectal polyps. Pathology showed tubulovillous adenomas x3; it was recommended he have a repeat colonoscopy in 3 years. He subsequently then underwent open partial right colectomy on July 2 by Dr. Brantley Stage.  Pathology showed multiple tubulovillous adenomas and some hyperplastic polyps. There was no malignancy identified including in the 11 lymph nodes that were removed. He subsequently had a rather difficult post operative course. He developed a "double pneumonia" and was treated with antibiotics. Since his surgery he has had severe diarrhea as well as low-grade fever. GI pathogen panel has been negative on 2 separate occasions. TSH is normal. He has been seen in follow-up by Dr. Brantley Stage as well as ID, Dr. Linus Salmons.  He is currently taking Questran 1 packet 3 times daily, florastor 2 times daily, and Imodium for the diarrhea. He says the diarrhea varies but some days he has 16-18 bowel movements a day. So far today he only had  6 bowel movements. His wife says that in the middle of him trying  To eat something that has to get up and use the restroom. He had a PICC line placed yesterday and apparently had been given IV fluids at home previously as well for dehydration.   Current Medications, Allergies, Past Medical History, Past Surgical History, Family History and Social History were reviewed in [employer] record.   Physical Exam: BP 108/60  Pulse 80  Ht 6' (1.829 m)  Wt 219 lb 2 oz (99.394 kg)  BMI 29.71 kg/m2 General: Well developed white male in no acute distress Head: Normocephalic and atraumatic Eyes:  Sclerae anicteric, conjunctiva pink  Ears: Normal auditory acuity Lungs: Clear throughout to auscultation Heart: Regular rate and rhythm Abdomen: Soft, non-distended.  Normal bowel sounds.  Non-tender.  Open chole and laparotomy scars noted. Musculoskeletal: Symmetrical with no gross deformities  Extremities: No edema  Neurological: Alert oriented x 4, grossly non-focal Psychological:  Alert and cooperative. Normal mood and affect  Assessment and Recommendations: -Diarrhea:  Since right partial colectomy surgery on 7/2.  Several times per day.  No improvement on medications to this point.  Referred back here by [facility].  Unsure of the cause of his diarrhea.  I discussed with Dr. Hilarie Fredrickson.  At this point we will check a stool pancreatic elastase and fecal leukocytes.  Will also check labs for celiac although this seems unlikely.  Will schedule for repeat colonoscopy with Dr. Deatra Ina for possible random biopsies, etc to try and determine the cause of the diarrhea.  In the interim he will continue the [place] three times per  day as well as the florastor twice a day.  Will discontinue Imodium and have him use Lomotil 1-2 every 6 hours prn.  Management of fluids via his PICC line per surgery. -Weight loss:  Down 15 pounds in 3 weeks.  This has also been an since his surgery.  Associated with  diarrhea.

## 2013-09-28 NOTE — Addendum Note (Signed)
Addended by: Hope Pigeon A on: 09/28/2013 04:25 PM   Modules accepted: Orders, Medications

## 2013-09-29 NOTE — Progress Notes (Signed)
Reviewed and agree with management. Solstice Lastinger D. Mickie Kozikowski, M.D., FACG  

## 2013-10-01 ENCOUNTER — Ambulatory Visit: Payer: BC Managed Care – PPO

## 2013-10-01 ENCOUNTER — Other Ambulatory Visit: Payer: Self-pay | Admitting: *Deleted

## 2013-10-01 ENCOUNTER — Telehealth: Payer: Self-pay | Admitting: *Deleted

## 2013-10-01 DIAGNOSIS — R197 Diarrhea, unspecified: Secondary | ICD-10-CM

## 2013-10-01 MED ORDER — DIPHENOXYLATE-ATROPINE 2.5-0.025 MG PO TABS: ORAL_TABLET | ORAL | Status: DC

## 2013-10-01 NOTE — Telephone Encounter (Signed)
Faxed lomotil, pharmacy did not get

## 2013-10-02 LAB — FECAL LACTOFERRIN, QUANT: Lactoferrin: POSITIVE

## 2013-10-02 LAB — T-TRANSGLUTAMINASE (TTG) IGG: TISSUE TRANSGLUT AB: 3 U/mL (ref 0–5)

## 2013-10-03 ENCOUNTER — Encounter (INDEPENDENT_AMBULATORY_CARE_PROVIDER_SITE_OTHER): Payer: Self-pay | Admitting: Surgery

## 2013-10-03 ENCOUNTER — Telehealth (INDEPENDENT_AMBULATORY_CARE_PROVIDER_SITE_OTHER): Payer: Self-pay

## 2013-10-03 ENCOUNTER — Ambulatory Visit (INDEPENDENT_AMBULATORY_CARE_PROVIDER_SITE_OTHER): Payer: BC Managed Care – PPO | Admitting: Surgery

## 2013-10-03 VITALS — BP 124/76 | HR 86 | Temp 98.5°F | Resp 16 | Ht 72.0 in | Wt 217.6 lb

## 2013-10-03 DIAGNOSIS — Z9889 Other specified postprocedural states: Secondary | ICD-10-CM

## 2013-10-03 DIAGNOSIS — R197 Diarrhea, unspecified: Secondary | ICD-10-CM

## 2013-10-03 NOTE — Patient Instructions (Signed)
Return 7 - 10 days.  Alternate 1 - 2 bags a day.  Continue meds and GI follow up.  Will check blood work.

## 2013-10-03 NOTE — Telephone Encounter (Signed)
WE NEED TO PUT THE ORDER IN.  FOR FUTURE REFERENCE OK TO RENEW PRESENT ORDER

## 2013-10-03 NOTE — Telephone Encounter (Signed)
Tried call ing [place] with [facility] to switch IV fluid amount. Left message for her to call me back so I can speak with her.

## 2013-10-03 NOTE — Progress Notes (Signed)
Patient returns to clinic today.  He still having multiple bowel movements a day and was seen yesterday at [facility] where nothing new was found. He feels better after IV fluids were given. He is on Questran but it is not slowing down his bowel function. He continues to have fever at night and has been seen twice by infectious disease without etiology identified. Today he is doing okay he is afebrile currently. He has a poor appetite but he is eating some. He has had 6 bowel movements already today and to previous C. Difficile test negative. He is following up with infectious disease. Denies abdominal pain. Temperature gets around 100.5-101 point 5 at night. Tylenol helps. IVF help and PICC in place.  Being seen by GI medicine for diarrhea.  Lost 15 lbs and has poor appetite.  Denies abdominal pain.  Feels week and has no stamina.  No vomiting or back pain.  Beer helps some.  Not drinking excessively.  Cannot work.   Exam: Fatigued male.  Abdomen: Soft nontender with mild distention. No rebound guarding. Well-healed incision.  Impression: 7 weeks out right hemicolectomy complicated by ileus, fever of unknown origin and failure to thrive with postop diarrhea  Plan: Arrange for home health to see him 3 times a week for IV fluid therapy. Follow up with GI on lomotil but not helping much.  Check labs and electrolytes.  . Blood cultures are all negative. . Try her best to keep him out of hospital. Return in 10 days.  Increase IVF to 1 - 2 liters a day to see if he feels more energy.  May need referral to tertiary  Care since  No idea how to slow diarrhea.  GI following.

## 2013-10-04 NOTE — Telephone Encounter (Signed)
Late entry: I spoke to [place] yesterday and gave verbal order for 10 bags of NS a week for IV fluids. Pt wife will alternate between 2 bags a day and 1 bag a day for total of 10 bags a week. Also gave verbal order for CBC and BMET to be drawn. She will fax over order for signature.

## 2013-10-05 ENCOUNTER — Encounter: Payer: Self-pay | Admitting: Gastroenterology

## 2013-10-08 LAB — PANCREATIC ELASTASE, FECAL: Pancreatic Elastase-1, Stool: 403 mcg/g

## 2013-10-09 ENCOUNTER — Encounter: Payer: Self-pay | Admitting: *Deleted

## 2013-10-09 ENCOUNTER — Telehealth (INDEPENDENT_AMBULATORY_CARE_PROVIDER_SITE_OTHER): Payer: Self-pay

## 2013-10-09 NOTE — Progress Notes (Signed)
Patient ID: Zachary Kim, male   DOB: 1950/10/29, 63 y.o.   MRN: [id number] Pt scheduled for colonoscopy at [facility] 8/26 with Dr. Deatra Ina. Pt has PICC line for home IV fluids.  Edmon Crape discussed with Dr. Deatra Ina need for hospital vs [facility] procedure due to concern difficult IV access.  Dr. Deatra Ina says okay for [facility] and pt has option of proceeding with colonoscopy with no sedation if unable to access IV. Izora Gala talked with pt's wife and they are agreeable to this if unable to find IV access. Pt's wife will infuse 1 liter of IV fluids before pt comes to [facility]

## 2013-10-09 NOTE — Telephone Encounter (Signed)
Pt's IV caregiver, [facility], calling to request a copy of operative note for PICC line.  Note copied and faxed to Butch Penny, [place] at [phone].

## 2013-10-10 ENCOUNTER — Ambulatory Visit (AMBULATORY_SURGERY_CENTER): Payer: BC Managed Care – PPO | Admitting: Gastroenterology

## 2013-10-10 ENCOUNTER — Encounter: Payer: Self-pay | Admitting: Gastroenterology

## 2013-10-10 VITALS — BP 162/85 | HR 80 | Temp 98.2°F | Resp 14 | Ht 73.0 in | Wt 217.0 lb

## 2013-10-10 DIAGNOSIS — D126 Benign neoplasm of colon, unspecified: Secondary | ICD-10-CM

## 2013-10-10 DIAGNOSIS — R197 Diarrhea, unspecified: Secondary | ICD-10-CM

## 2013-10-10 MED ORDER — BUDESONIDE 9 MG PO TB24: 1.0000 | ORAL_TABLET | Freq: Every morning | ORAL | Status: DC

## 2013-10-10 MED ORDER — MESALAMINE 1.2 G PO TBEC: 2.4000 g | DELAYED_RELEASE_TABLET | Freq: Every day | ORAL | Status: DC

## 2013-10-10 MED ORDER — SODIUM CHLORIDE 0.9 % IV SOLN: 500.0000 mL | INTRAVENOUS | Status: DC

## 2013-10-10 NOTE — Progress Notes (Signed)
A/ox3 pleased with MAC, report to Tracy W RN 

## 2013-10-10 NOTE — Op Note (Signed)
[facility]  [address] [place], [id number]   COLONOSCOPY PROCEDURE REPORT  PATIENT: Zachary, Kim  MR#: [id number] BIRTHDATE: Sep 07, 1950 , 2  yrs. old GENDER: Male ENDOSCOPIST: Inda Castle, MD REFERRED BY: PROCEDURE DATE:  10/10/2013 PROCEDURE:   Colonoscopy with biopsy and Colonoscopy with cold biopsy polypectomy First Screening Colonoscopy - Avg.  risk and is 50 yrs.  old or older - No.  Prior Negative Screening - Now for repeat screening. N/A  History of Adenoma - Now for follow-up colonoscopy & has been > or = to 3 yrs.  No.  It has been less than 3 yrs since last colonoscopy.  Medical reason.  Polyps Removed Today? Yes. ASA CLASS:   Class II INDICATIONS:Unexplained diarrhea. MEDICATIONS: MAC sedation, administered by CRNA and propofol (Diprivan) 150mg  IV  DESCRIPTION OF PROCEDURE:   After the risks benefits and alternatives of the procedure were thoroughly explained, informed consent was obtained.  A digital rectal exam revealed no abnormalities of the rectum.   The LB [id number]  endoscope was introduced through the anus and advanced to the ileum. No adverse events experienced.   The quality of the prep was excellent using Suprep  The instrument was then slowly withdrawn as the colon was fully examined.      COLON FINDINGS: Two sessile polyps measuring 2 mm in size were found in the sigmoid colon.  A polypectomy was performed with cold forceps.   The colon was otherwise normal. Random biopsies were taken throughout the colon to rule out microscopic colitis. There was no diverticulosis, inflammation, polyps or cancers unless previously stated.  Retroflexed views revealed no abnormalities. The time to cecum=3 minutes 33 seconds.  Withdrawal time=9 minutes 14 seconds.  The scope was withdrawn and the procedure completed. COMPLICATIONS: There were no complications.  ENDOSCOPIC IMPRESSION: 1.   Two sessile polyps measuring 2 mm in  size were found in the sigmoid colon; polypectomy was performed with cold forceps 2.   The colon was otherwise normal  RECOMMENDATIONS: 1.  Await biopsy results 2.  empiric therapy with Uceris and lialda 3.  t/c empiric antibiotic therapy pending results of above 4.  office visit one month   eSigned:  Inda Castle, MD 10/10/2013 11:01 AM   cc: Suzan Garibaldi, MD, Scharlene Gloss, MD, Erroll Luna, MD   PATIENT NAME:  Zachary, Kim MR#: [id number]

## 2013-10-10 NOTE — Progress Notes (Signed)
Called to room to assist during endoscopic procedure.  Patient ID and intended procedure confirmed with present staff. Received instructions for my participation in the procedure from the performing physician.  

## 2013-10-10 NOTE — Patient Instructions (Signed)
Findings:  Polyps Recommendations:  Empiric therapy with Uceris and Lialda,  Office will call with office visit in one month.  Wait for biopsy results  YOU HAD AN ENDOSCOPIC PROCEDURE TODAY AT [facility]: Refer to the procedure report that was given to you for any specific questions about what was found during the examination.  If the procedure report does not answer your questions, please call your gastroenterologist to clarify.  If you requested that your care partner not be given the details of your procedure findings, then the procedure report has been included in a sealed envelope for you to review at your convenience later.  YOU SHOULD EXPECT: Some feelings of bloating in the abdomen. Passage of more gas than usual.  Walking can help get rid of the air that was put into your GI tract during the procedure and reduce the bloating. If you had a lower endoscopy (such as a colonoscopy or flexible sigmoidoscopy) you may notice spotting of blood in your stool or on the toilet paper. If you underwent a bowel prep for your procedure, then you may not have a normal bowel movement for a few days.  DIET: Your first meal following the procedure should be a light meal and then it is ok to progress to your normal diet.  A half-sandwich or bowl of soup is an example of a good first meal.  Heavy or fried foods are harder to digest and may make you feel nauseous or bloated.  Likewise meals heavy in dairy and vegetables can cause extra gas to form and this can also increase the bloating.  Drink plenty of fluids but you should avoid alcoholic beverages for 24 hours.  ACTIVITY: Your care partner should take you home directly after the procedure.  You should plan to take it easy, moving slowly for the rest of the day.  You can resume normal activity the day after the procedure however you should NOT DRIVE or use heavy machinery for 24 hours (because of the sedation medicines used during the test).     SYMPTOMS TO REPORT IMMEDIATELY: A gastroenterologist can be reached at any hour.  During normal business hours, 8:30 AM to 5:00 PM Monday through Friday, call [phone].  After hours and on weekends, please call the GI answering service at [phone] who will take a message and have the physician on call contact you.   Following lower endoscopy (colonoscopy or flexible sigmoidoscopy):  Excessive amounts of blood in the stool  Significant tenderness or worsening of abdominal pains  Swelling of the abdomen that is new, acute  Fever of 100F or higher  Following upper endoscopy (EGD)  Vomiting of blood or coffee ground material  New chest pain or pain under the shoulder blades  Painful or persistently difficult swallowing  New shortness of breath  Fever of 100F or higher  Black, tarry-looking stools  FOLLOW UP: If any biopsies were taken you will be contacted by phone or by letter within the next 1-3 weeks.  Call your gastroenterologist if you have not heard about the biopsies in 3 weeks.  Our staff will call the home number listed on your records the next business day following your procedure to check on you and address any questions or concerns that you may have at that time regarding the information given to you following your procedure. This is a courtesy call and so if there is no answer at the home number and we have not heard from  you through the emergency physician on call, we will assume that you have returned to your regular daily activities without incident.  SIGNATURES/CONFIDENTIALITY: You and/or your care partner have signed paperwork which will be entered into your electronic medical record.  These signatures attest to the fact that that the information above on your After Visit Summary has been reviewed and is understood.  Full responsibility of the confidentiality of this discharge information lies with you and/or your care-partner.  Please follow all discharge  instructions given to you by the recovery room nurse. If you have any questions or problems after discharge please call one of the numbers listed above. You will receive a phone call in the am to see how you are doing and answer any questions you may have. Thank you for choosing [facility] for your health care needs.

## 2013-10-11 ENCOUNTER — Telehealth: Payer: Self-pay | Admitting: *Deleted

## 2013-10-11 ENCOUNTER — Encounter (INDEPENDENT_AMBULATORY_CARE_PROVIDER_SITE_OTHER): Payer: Self-pay

## 2013-10-11 NOTE — Telephone Encounter (Signed)
Ok to refill x 1  

## 2013-10-11 NOTE — Telephone Encounter (Signed)
Per dr Carlean Purl prescribe [phone]   Called pharmacy and refilled patients medication

## 2013-10-11 NOTE — Telephone Encounter (Signed)
Dr Carlean Purl, The pharmacy called and stated that this patient needs a refill of Lomotil '  Is it ok to refill

## 2013-10-11 NOTE — Telephone Encounter (Signed)
  Follow up Call-  Call back number 10/10/2013 07/10/2013  Post procedure Call Back phone  # [phone]  Permission to leave phone message Yes Yes     Patient questions:  Do you have a fever, pain , or abdominal swelling? No. Pain Score  0 *  Have you tolerated food without any problems? Yes.    Have you been able to return to your normal activities? Yes.    Do you have any questions about your discharge instructions: Diet   No. Medications  No. Follow up visit  No.  Do you have questions or concerns about your Care? No.  Actions: * If pain score is 4 or above: No action needed, pain <4.

## 2013-10-12 ENCOUNTER — Ambulatory Visit (INDEPENDENT_AMBULATORY_CARE_PROVIDER_SITE_OTHER): Payer: BC Managed Care – PPO | Admitting: Surgery

## 2013-10-12 ENCOUNTER — Encounter (INDEPENDENT_AMBULATORY_CARE_PROVIDER_SITE_OTHER): Payer: Self-pay | Admitting: Surgery

## 2013-10-12 VITALS — BP 116/66 | HR 78 | Temp 98.0°F | Ht 72.0 in | Wt 220.2 lb

## 2013-10-12 DIAGNOSIS — Z9889 Other specified postprocedural states: Secondary | ICD-10-CM

## 2013-10-12 DIAGNOSIS — R197 Diarrhea, unspecified: Secondary | ICD-10-CM

## 2013-10-12 NOTE — Patient Instructions (Signed)
Return 2 weeks.  Continue IVF until 5 or less BM per day

## 2013-10-12 NOTE — Progress Notes (Signed)
Patient returns to clinic today.  He still having multiple bowel movements a day and was seen yesterday at [facility] where nothing new was found. He feels better after IV fluids were given. He is on Questran but it is not slowing down his bowel function. He continues to have fever at night and has been seen twice by infectious disease without etiology identified. Today he is doing okay he is afebrile currently. He has a poor appetite but he is eating some. He has had 6 bowel movements already today and to previous C. Difficile test negative. He is following up with infectious disease. Denies abdominal pain. Temperature gets around 100.5-101 point 5 at night. Tylenol helps. IVF help and PICC in place.  Being seen by GI medicine for diarrhea.  Lost 15 lbs and has poor appetite.  Denies abdominal pain.  Feels week and has no stamina.  No vomiting or back pain.  Beer helps some.  Not drinking excessively.  Cannot work.  Colonoscopy this week normal and CBC and CMET normal. Looks better and in better mood.   Exam: white male NAD  Abdomen: Soft nontender with mild distention. No rebound guarding. Well-healed incision.  Impression: 8 weeks out right hemicolectomy complicated by ileus, fever of unknown origin and failure to thrive with postop diarrhea  Plan: Arrange for home health to see him 3 times a week for IV fluid therapy. Follow up with GI on lomotil but not helping much.  Check labs and electrolytes.  . Blood cultures are all negative. . Try her best to keep him out of hospital. Return in 14 days. Cont IVF.  Electrolytes look good.

## 2013-10-16 ENCOUNTER — Other Ambulatory Visit (INDEPENDENT_AMBULATORY_CARE_PROVIDER_SITE_OTHER): Payer: Self-pay | Admitting: Surgery

## 2013-10-17 ENCOUNTER — Other Ambulatory Visit: Payer: Self-pay

## 2013-10-17 MED ORDER — SBI/PROTEIN ISOLATE 5 G PO PACK: 1.0000 | PACK | Freq: Two times a day (BID) | ORAL | Status: DC

## 2013-10-18 ENCOUNTER — Other Ambulatory Visit: Payer: Self-pay

## 2013-10-18 MED ORDER — SBI/PROTEIN ISOLATE 5 G PO PACK: 1.0000 | PACK | Freq: Two times a day (BID) | ORAL | Status: DC

## 2013-10-26 ENCOUNTER — Encounter (INDEPENDENT_AMBULATORY_CARE_PROVIDER_SITE_OTHER): Payer: BC Managed Care – PPO | Admitting: Surgery

## 2013-10-29 ENCOUNTER — Telehealth: Payer: Self-pay

## 2013-10-29 ENCOUNTER — Other Ambulatory Visit: Payer: Self-pay

## 2013-10-29 ENCOUNTER — Other Ambulatory Visit: Payer: Self-pay | Admitting: Gastroenterology

## 2013-10-29 NOTE — Telephone Encounter (Signed)
Message copied by Greggory Keen on Mon Oct 29, 2013  4:20 PM ------      Message from: Irene Shipper      Created: Mon Oct 29, 2013  3:34 PM       Okay to refill once, under Dr. Kelby Fam name.      ----- Message -----         From: Virgina Evener, LPN         Sent: 3/54/6568   1:57 PM           To: Irene Shipper, MD            He is requesting a refill of Lomotil. It was last filled on 10/11/13 for #60.The SIG is 1 to 2 every 6 hrs. Thank you.                                                                                                                                                               ------

## 2013-10-29 NOTE — Telephone Encounter (Signed)
Dr. Deatra Ina patient

## 2013-10-29 NOTE — Telephone Encounter (Signed)
Lomotil refilled as directed.

## 2013-10-30 NOTE — Telephone Encounter (Signed)
Okay per Dr Henrene Pastor

## 2013-11-28 ENCOUNTER — Other Ambulatory Visit: Payer: Self-pay | Admitting: Gastroenterology

## 2013-12-13 ENCOUNTER — Ambulatory Visit (INDEPENDENT_AMBULATORY_CARE_PROVIDER_SITE_OTHER): Payer: BC Managed Care – PPO | Admitting: Gastroenterology

## 2013-12-13 ENCOUNTER — Encounter: Payer: Self-pay | Admitting: *Deleted

## 2013-12-13 ENCOUNTER — Other Ambulatory Visit (INDEPENDENT_AMBULATORY_CARE_PROVIDER_SITE_OTHER): Payer: BC Managed Care – PPO

## 2013-12-13 ENCOUNTER — Encounter: Payer: Self-pay | Admitting: Gastroenterology

## 2013-12-13 VITALS — BP 148/70 | HR 84 | Ht 72.0 in | Wt 228.0 lb

## 2013-12-13 DIAGNOSIS — R197 Diarrhea, unspecified: Secondary | ICD-10-CM

## 2013-12-13 LAB — COMPREHENSIVE METABOLIC PANEL
ALBUMIN: 3.4 g/dL — AB (ref 3.5–5.2)
ALT: 19 U/L (ref 0–53)
AST: 15 U/L (ref 0–37)
Alkaline Phosphatase: 61 U/L (ref 39–117)
BUN: 11 mg/dL (ref 6–23)
CO2: 21 mEq/L (ref 19–32)
Calcium: 9.3 mg/dL (ref 8.4–10.5)
Chloride: 103 mEq/L (ref 96–112)
Creatinine, Ser: 0.9 mg/dL (ref 0.4–1.5)
GFR: 93.16 mL/min (ref >60.00)
Glucose, Bld: 111 mg/dL — ABNORMAL HIGH (ref 70–99)
Potassium: 3.7 mEq/L (ref 3.5–5.1)
SODIUM: 136 meq/L (ref 135–145)
TOTAL PROTEIN: 6.9 g/dL (ref 6.0–8.3)
Total Bilirubin: 0.7 mg/dL (ref 0.2–1.2)

## 2013-12-13 LAB — CBC WITH DIFFERENTIAL/PLATELET
Basophils Absolute: 0 10*3/uL (ref 0.0–0.1)
Basophils Relative: 0.2 % (ref 0.0–3.0)
EOS ABS: 0.1 10*3/uL (ref 0.0–0.7)
Eosinophils Relative: 0.6 % (ref 0.0–5.0)
HCT: 39.2 % (ref 39.0–52.0)
Hemoglobin: 13.4 g/dL (ref 13.0–17.0)
Lymphocytes Relative: 26.4 % (ref 12.0–46.0)
Lymphs Abs: 2.4 10*3/uL (ref 0.7–4.0)
MCHC: 34.1 g/dL (ref 30.0–36.0)
MCV: 93.1 fl (ref 78.0–100.0)
MONO ABS: 0.8 10*3/uL (ref 0.1–1.0)
Monocytes Relative: 8.6 % (ref 3.0–12.0)
NEUTROS PCT: 64.2 % (ref 43.0–77.0)
Neutro Abs: 5.8 10*3/uL (ref 1.4–7.7)
PLATELETS: 184 10*3/uL (ref 150.0–400.0)
RBC: 4.21 Mil/uL — ABNORMAL LOW (ref 4.22–5.81)
RDW: 15.9 % — ABNORMAL HIGH (ref 11.5–15.5)
WBC: 9 10*3/uL (ref 4.0–10.5)

## 2013-12-13 NOTE — Patient Instructions (Signed)
Follow up is scheduled on 01/21/2014 at 2:15pm Go to the basement  For labs today Stop Uceris in 1 week and Lialda in one week  Call back if diarrhea worsens

## 2013-12-13 NOTE — Progress Notes (Signed)
      History of Present Illness:  [name] has returned for follow-up of diarrhea.  He takes 3 Lomotil daily.  With that he generally tends to have a solid stool although it may be poorly formed.  His main complaint is overall fatigue.  He remains on Uceris and lialda.  Random biopsies from colonoscopy were negative.    Review of Systems: Pertinent positive and negative review of systems were noted in the above HPI section. All other review of systems were otherwise negative.    Current Medications, Allergies, Past Medical History, Past Surgical History, Family History and Social History were reviewed in [facility] record  Vital signs were reviewed in today's medical record. Physical Exam: General: Well developed , well nourished, no acute distress   See Assessment and Plan under Problem List

## 2013-12-13 NOTE — Progress Notes (Signed)
Patient ID: Zachary Kim, male   DOB: 1950-04-26, 63 y.o.   MRN: [id number] Inda Castle, MD Laverna Peace, RN            10 years      Previous Messages      ----- Message -----  From: Laverna Peace, RN  Sent: 12/12/2013 3:44 PM  To: Inda Castle, MD   Dr. Deatra Ina,   When do want this pt to come back for his next colonoscopy?   Thanks,  [employer]

## 2013-12-13 NOTE — Assessment & Plan Note (Addendum)
Diarrhea clearly has improved on a regimen a Uceris, lialda and Lomotil.  Etiology is probably related to surgery with altered anatomy.  Recommendations #1 DC Uceris.  If stable he will discontinue lialda and one week while continuing Lomotil #2 check CBC and comprehensive metabolic profile

## 2014-01-21 ENCOUNTER — Ambulatory Visit (INDEPENDENT_AMBULATORY_CARE_PROVIDER_SITE_OTHER): Payer: BC Managed Care – PPO | Admitting: Gastroenterology

## 2014-01-21 ENCOUNTER — Encounter: Payer: Self-pay | Admitting: Gastroenterology

## 2014-01-21 VITALS — BP 138/72 | HR 84 | Ht 72.0 in | Wt 220.0 lb

## 2014-01-21 DIAGNOSIS — D126 Benign neoplasm of colon, unspecified: Secondary | ICD-10-CM

## 2014-01-21 DIAGNOSIS — K635 Polyp of colon: Secondary | ICD-10-CM

## 2014-01-21 DIAGNOSIS — R197 Diarrhea, unspecified: Secondary | ICD-10-CM

## 2014-01-21 MED ORDER — DIPHENOXYLATE-ATROPINE 2.5-0.025 MG PO TABS: 2.0000 | ORAL_TABLET | Freq: Four times a day (QID) | ORAL | Status: DC | PRN

## 2014-01-21 NOTE — Progress Notes (Signed)
      History of Present Illness:  Zachary Kim has returned for follow-up of diarrhea.  On Lomotil one tablet 3 times a day alone symptoms are fairly well-controlled.  At times he'll have loose stools with urgency.  He is unable to lower the dose without incurring severe diarrhea.  He thinks he felt better when he was taking uceris and lialda.  He denies abdominal pain or bleeding.  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  He still complains of fatigue.    Review of Systems: Pertinent positive and negative review of systems were noted in the above HPI section. All other review of systems were otherwise negative.    Current Medications, Allergies, Past Medical History, Past Surgical History, Family History and Social History were reviewed in [facility] record  Vital signs were reviewed in today's medical record. Physical Exam: General: Well developed , well nourished, no acute distress   See Assessment and Plan under Problem List

## 2014-01-21 NOTE — Patient Instructions (Signed)
Your follow up appointment is scheduled on 03/20/2014 at 2pm Go to the basement for labs today We are giving you a printed prescription of Lomotil  Go to the basement for labs

## 2014-01-21 NOTE — Assessment & Plan Note (Signed)
Etiology is still unclear.  Symptoms are moderately controlled with Lomotil.  He thinks he may have been better with the addition of uceris and lialda.  Stool studies have all been negative.  Diarrhea may be the result of his altered anatomy.  Recommendations #1 repeat stool C. difficile toxin and lactoferrin #2 patient was instructed to take Lomotil 2 tabs every morning and 1-2 tabs every 6-8 hours

## 2014-01-21 NOTE — Assessment & Plan Note (Signed)
Post right hemicolectomy.  Plan colonoscopy in 5 years

## 2014-01-22 ENCOUNTER — Other Ambulatory Visit: Payer: BC Managed Care – PPO

## 2014-01-22 DIAGNOSIS — R197 Diarrhea, unspecified: Secondary | ICD-10-CM

## 2014-01-23 LAB — FECAL LACTOFERRIN, QUANT: Lactoferrin: POSITIVE

## 2014-01-23 LAB — CLOSTRIDIUM DIFFICILE BY PCR: CDIFFPCR: NOT DETECTED

## 2014-01-24 ENCOUNTER — Other Ambulatory Visit: Payer: Self-pay

## 2014-01-24 MED ORDER — METRONIDAZOLE 500 MG PO TABS: ORAL_TABLET | ORAL | Status: DC

## 2014-01-24 NOTE — Progress Notes (Signed)
Quick Note:  Patient has pseudomembranous colitis. Please prescribe Flagyl (metronidazole) sees 500 mg every [place] for 14 days. He needs office visit in 8 weeks ______

## 2014-03-22 ENCOUNTER — Ambulatory Visit: Payer: BC Managed Care – PPO | Admitting: Gastroenterology

## 2014-04-22 ENCOUNTER — Encounter: Payer: Self-pay | Admitting: Gastroenterology

## 2014-04-22 ENCOUNTER — Ambulatory Visit (INDEPENDENT_AMBULATORY_CARE_PROVIDER_SITE_OTHER): Payer: BLUE CROSS/BLUE SHIELD | Admitting: Gastroenterology

## 2014-04-22 VITALS — BP 136/78 | HR 80 | Ht 72.0 in | Wt 232.2 lb

## 2014-04-22 DIAGNOSIS — R197 Diarrhea, unspecified: Secondary | ICD-10-CM

## 2014-04-22 MED ORDER — CHOLESTYRAMINE 4 G PO PACK: 4.0000 g | PACK | Freq: Four times a day (QID) | ORAL | Status: DC

## 2014-04-22 NOTE — Patient Instructions (Signed)
Discontinue Protonix,pepsid,and florastor We will send in a new prescription to your pharmacy

## 2014-04-22 NOTE — Assessment & Plan Note (Addendum)
Stool studies including C. difficile was negative.  Stool lactoferrin was requested but not done.  Lomotil seems to be keeping symptoms and moderately good control.  Overall I believe symptoms are related to his altered anatomy.  Recommendations #1 DC Pepcid, Protonix and he is serous #2 add cholestyramine one packet 4 times a day #3 reevaluate in 6 weeks

## 2014-04-22 NOTE — Progress Notes (Signed)
      History of Present Illness:  [name] as returned for follow-up of diarrhea.  On a regimen of Lomotil 2 tabs 4 times a day symptoms are fairly well-controlled.  He may have an initial solid bowel movement followed by loose stools.  He does complain of urgency.  Energy level has improved and he has gained weight.    Review of Systems: Pertinent positive and negative review of systems were noted in the above HPI section. All other review of systems were otherwise negative.    Current Medications, Allergies, Past Medical History, Past Surgical History, Family History and Social History were reviewed in [facility] record  Vital signs were reviewed in today's medical record. Physical Exam: General: Well developed , well nourished, no acute distress   See Assessment and Plan under Problem List

## 2014-05-04 ENCOUNTER — Other Ambulatory Visit: Payer: Self-pay | Admitting: Gastroenterology

## 2014-05-06 ENCOUNTER — Other Ambulatory Visit: Payer: Self-pay | Admitting: Gastroenterology

## 2014-06-03 ENCOUNTER — Telehealth: Payer: Self-pay | Admitting: Gastroenterology

## 2014-06-03 NOTE — Telephone Encounter (Signed)
Left message for pt to call back  °

## 2014-06-04 ENCOUNTER — Ambulatory Visit: Payer: BLUE CROSS/BLUE SHIELD | Admitting: Gastroenterology

## 2014-06-04 NOTE — Telephone Encounter (Signed)
I have left message for the patient to call back. I want to know what he is presently taking. Specifically, is he taking Questran or Lomotil?

## 2014-06-10 NOTE — Telephone Encounter (Signed)
I have left message for the patient to call back 

## 2014-06-11 ENCOUNTER — Other Ambulatory Visit: Payer: Self-pay

## 2014-06-11 MED ORDER — LOPERAMIDE HCL 2 MG PO TABS: ORAL_TABLET | ORAL | Status: DC

## 2014-06-11 MED ORDER — SUCRALFATE 1 G PO TABS: 1.0000 g | ORAL_TABLET | Freq: Four times a day (QID) | ORAL | Status: DC

## 2014-06-11 MED ORDER — DIPHENOXYLATE-ATROPINE 2.5-0.025 MG PO TABS: 2.0000 | ORAL_TABLET | Freq: Four times a day (QID) | ORAL | Status: DC

## 2014-06-11 NOTE — Telephone Encounter (Signed)
Advised 

## 2014-06-11 NOTE — Telephone Encounter (Signed)
He refuses Questran. He will only take pills. He is taking Lomotil. He has Costipol that he will take but is not taking daily at this point. He didn't know if he could. He has 5 to 7 loose bm's daily on a good day. Please advise on a regimen to decrease his bm's.

## 2014-06-11 NOTE — Telephone Encounter (Signed)
He should be taking 2 Imodium every 6 hours.  Candida Carafate 1 g 4 times a day.

## 2014-06-11 NOTE — Telephone Encounter (Signed)
I have left message for the patient to call back 

## 2014-07-30 ENCOUNTER — Telehealth: Payer: Self-pay | Admitting: Gastroenterology

## 2014-07-30 ENCOUNTER — Encounter: Payer: Self-pay | Admitting: Gastroenterology

## 2014-07-30 ENCOUNTER — Ambulatory Visit (INDEPENDENT_AMBULATORY_CARE_PROVIDER_SITE_OTHER): Payer: BLUE CROSS/BLUE SHIELD | Admitting: Gastroenterology

## 2014-07-30 DIAGNOSIS — R197 Diarrhea, unspecified: Secondary | ICD-10-CM

## 2014-07-30 MED ORDER — DIPHENOXYLATE-ATROPINE 2.5-0.025 MG PO TABS: 2.0000 | ORAL_TABLET | Freq: Four times a day (QID) | ORAL | Status: DC

## 2014-07-30 NOTE — Patient Instructions (Signed)
We are giving you a printed prescription today of Lomotil Call back in one week to report progress Follow up in 3 months

## 2014-07-30 NOTE — Telephone Encounter (Signed)
Patient calling has already been on Lomotil and did not help. They didn't realize that till they got home with he prescription   Carafate was not on his list wanted you to know he was taking this but it didn't help        Do you want patient to go ahead and continue Lomotil and stop carafate?

## 2014-07-30 NOTE — Assessment & Plan Note (Signed)
Trial of Lomotil 2 tabs every 6 hours.  If not improved will try hyomax every 6 hours.  20  minutes were spent with the patient and/or family.  Greater than 50% was spent in counseling and coordination of care with the patient

## 2014-07-30 NOTE — Progress Notes (Signed)
      History of Present Illness:  Mr. Carlile and 10 use to complain of diarrhea.  Typically he has a solid bowel movement in the morning followed by 3-4 loose stools throughout the day.  Stools are accompanied by urgency.  He will not take Questran because it is a powder.  He feels that Imodium and Carafate did not improve his symptoms.    Review of Systems: Pertinent positive and negative review of systems were noted in the above HPI section. All other review of systems were otherwise negative.    Current Medications, Allergies, Past Medical History, Past Surgical History, Family History and Social History were reviewed in [facility] record  Vital signs were reviewed in today's medical record. Physical Exam: General: Well developed , well nourished, no acute distress   See Assessment and Plan under Problem List

## 2014-07-31 NOTE — Telephone Encounter (Signed)
He can stop Carafate since it is not helping.  He also does not need to take Lomotil.  Let's see how he does with fiber supplementation alone.  He should be taking one dose daily.  Call back in 2 weeks

## 2014-08-02 NOTE — Telephone Encounter (Signed)
Wants to speak to [facility]

## 2014-10-15 ENCOUNTER — Telehealth: Payer: Self-pay | Admitting: Gastroenterology

## 2014-10-16 NOTE — Telephone Encounter (Signed)
i don't recall seeing any letter

## 2014-10-22 NOTE — Telephone Encounter (Signed)
Found a request for medical records. No letter on plan of care scanned into chart or in Dr Kelby Fam unscanned documents. Left message for [employer] "Judson Roch" of this.

## 2015-08-28 IMAGING — CR DG ABD PORTABLE 1V
1 series · 1 of 1 positions shown · non-contrast
Comparison: CT abdomen and pelvis 06/28/2013.

CLINICAL DATA: Status post discharge from the hospital 1 day ago
after right hemicolectomy 08/16/2013 for cecal mass.

EXAM:
PORTABLE ABDOMEN - 1 VIEW

[supine ap]
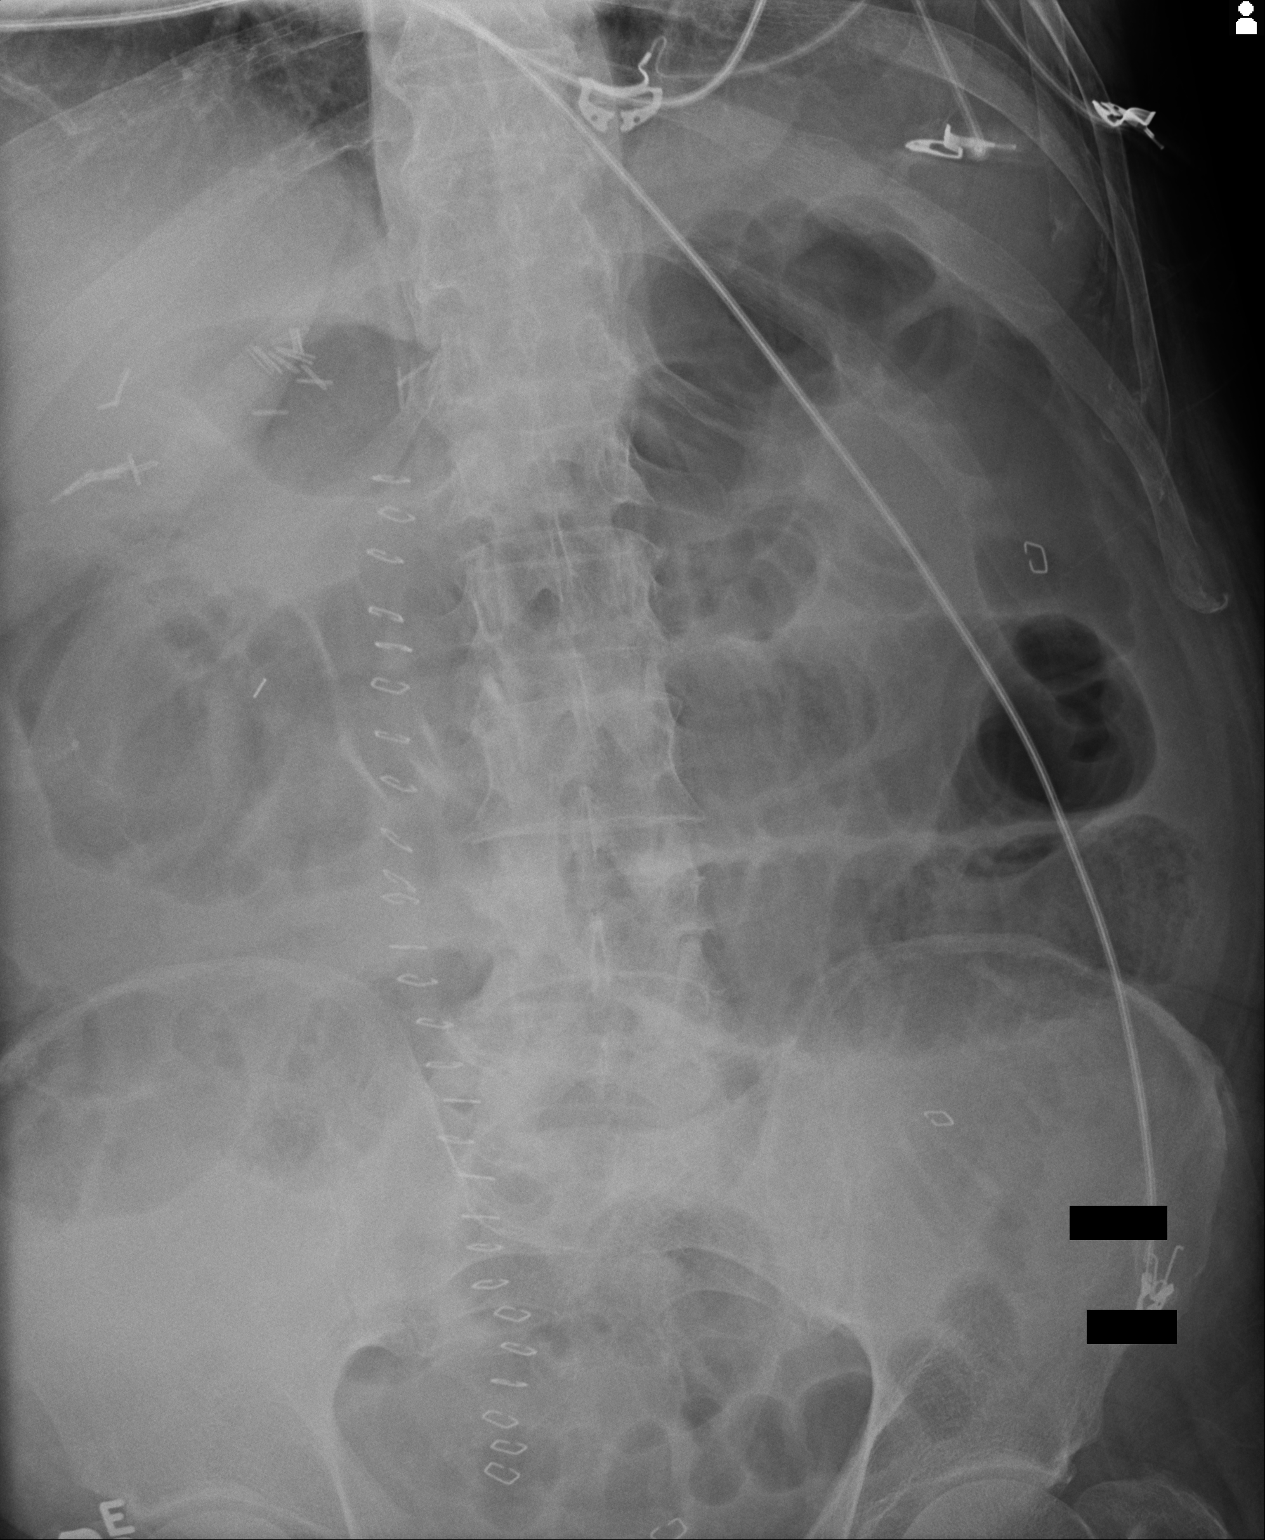

[1 of 1 positions shown; findings below may reference images not displayed]

FINDINGS: Midline surgical staples are in place. Surgical clips in the left
upper quadrant are identified. Small bowel loops are dilated up to
5.8 cm. A small amount gas is seen the descending colon.
IMPRESSION: Dilatation of small bowel loops of the 5.8 cm could be due to
obstruction or ileus.

## 2015-08-30 IMAGING — CR DG ABDOMEN 1V
2 series · 2 of 2 positions shown · non-contrast
Comparison: 08/24/2013

CLINICAL DATA: Abdominal pain

EXAM:
ABDOMEN - 1 VIEW

[t abdomen supine (1 of 2)]
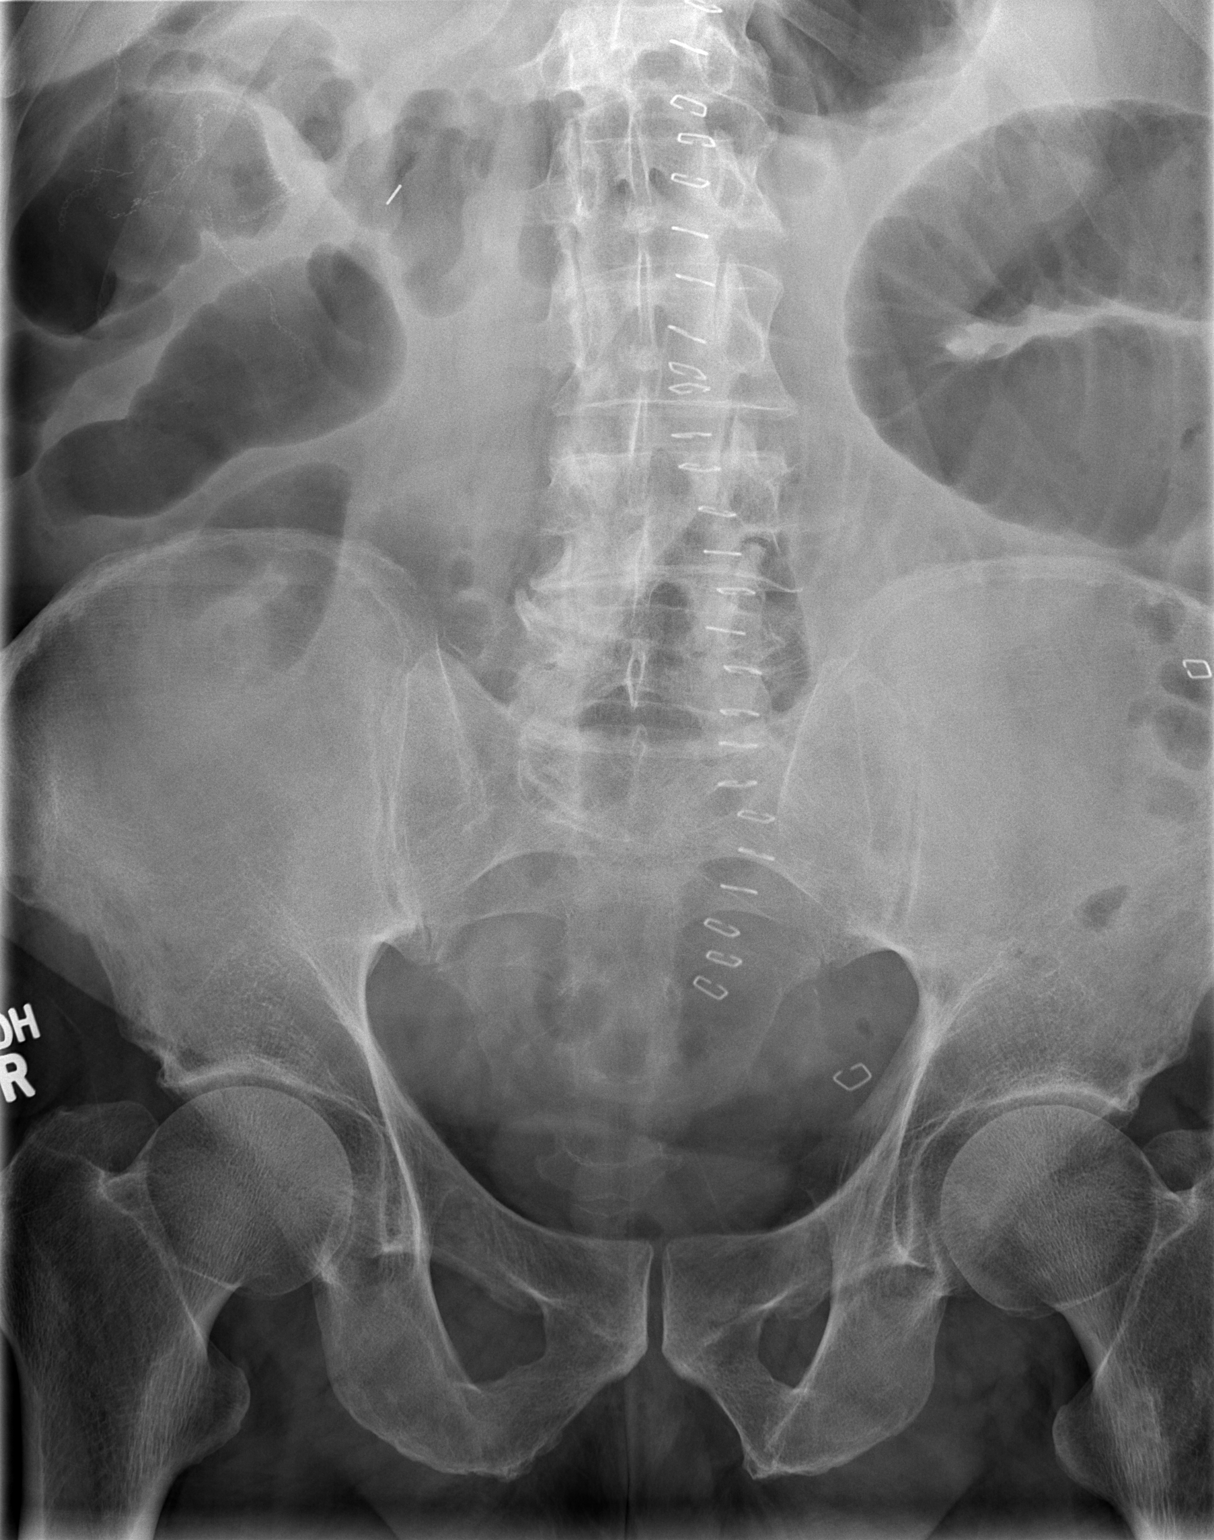

[t abdomen supine (2 of 2)]
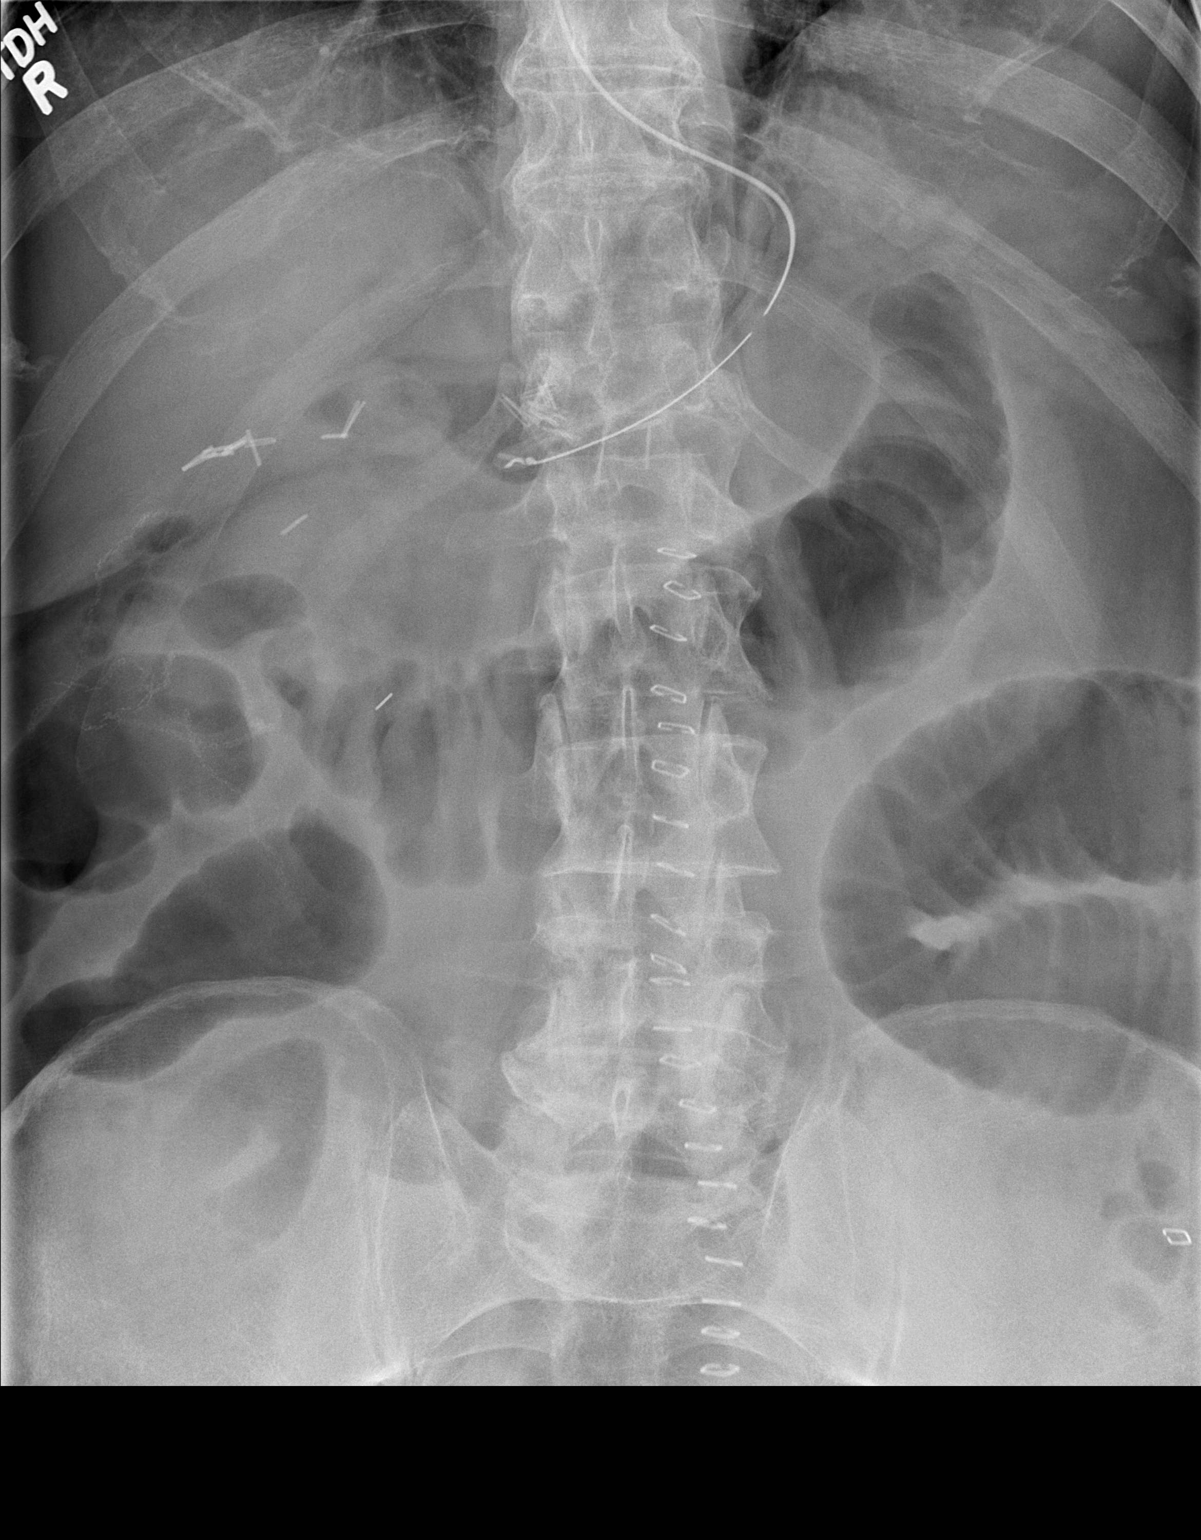

[2 of 2 positions shown; findings below may reference images not displayed]

FINDINGS: Postsurgical changes are again seen. A nasogastric catheter is noted
within the stomach. Diffuse small bowel dilatation is noted. No free
air is seen. No acute bony abnormality is noted.
IMPRESSION: Stable small bowel dilatation.  Continued followup is recommended.

## 2015-09-18 IMAGING — CT CT ABD-PELV W/ CM
2 of 5 series · 11 of 46 positions shown, 12 images · IV contrast (Iodine)
Comparison: 08/31/2013

CLINICAL DATA: Recent colectomy.  Fever

EXAM:
CT ABDOMEN AND PELVIS WITH CONTRAST
TECHNIQUE: Multidetector CT imaging of the abdomen and pelvis was performed
using the standard protocol following bolus administration of
intravenous contrast.
CONTRAST:  100mL OMNIPAQUE IOHEXOL 300 MG/ML  SOLN

[Series 201: routine, idose (2) · axial · 0.86mm/px · z∈[+96,+506]mm · 8 of 102 slices shown, 9 images]
[im 10/102  soft-tissue]
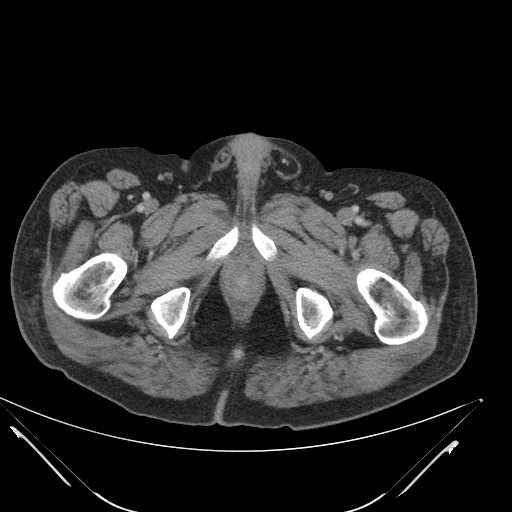
[im 10/102  bone]
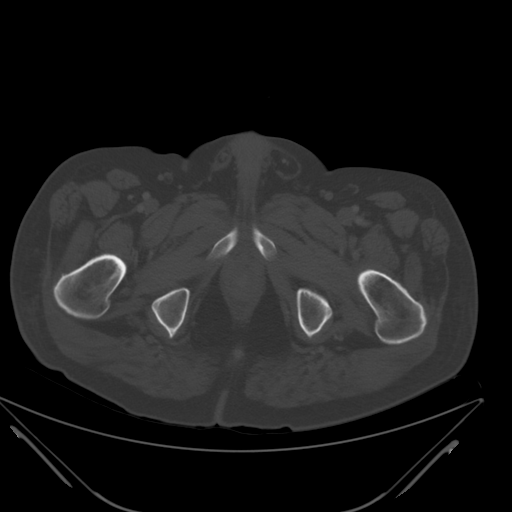
[im 20/102  soft-tissue]
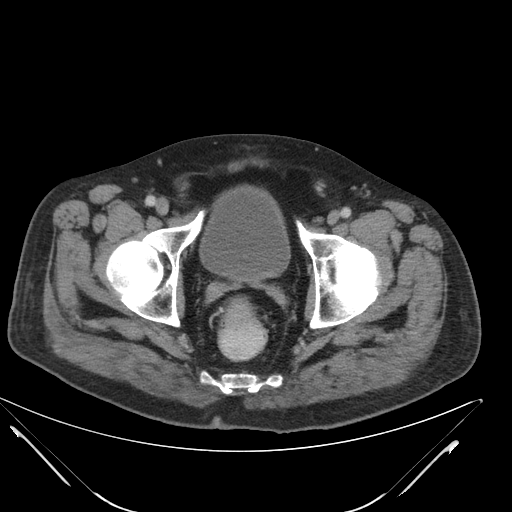
[im 34/102  soft-tissue]
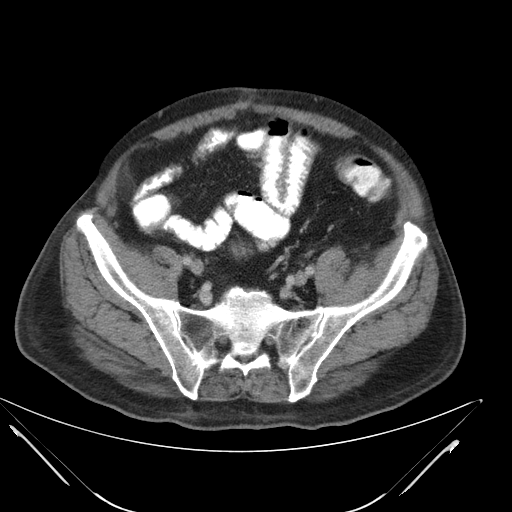
[im 44/102  soft-tissue]
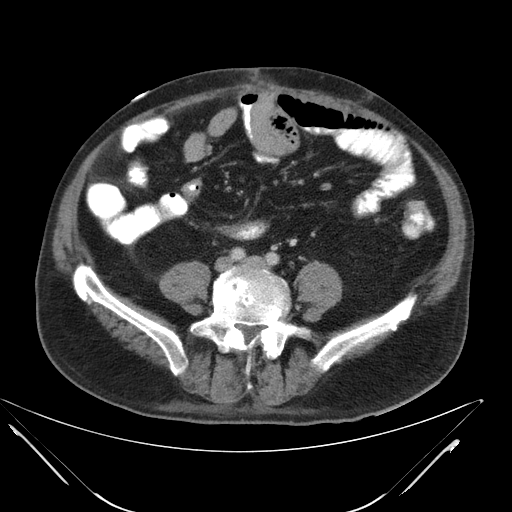
[im 58/102  soft-tissue]
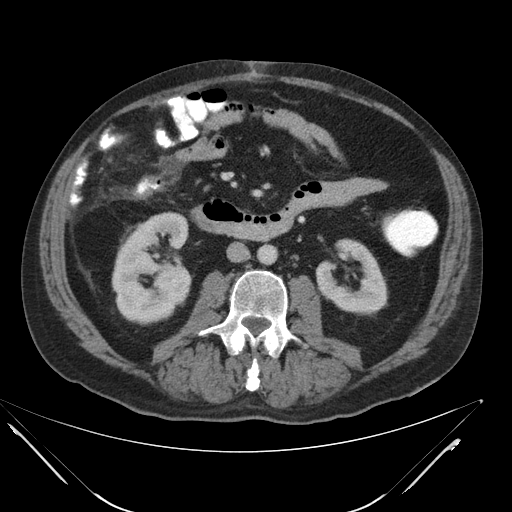
[im 68/102  soft-tissue]
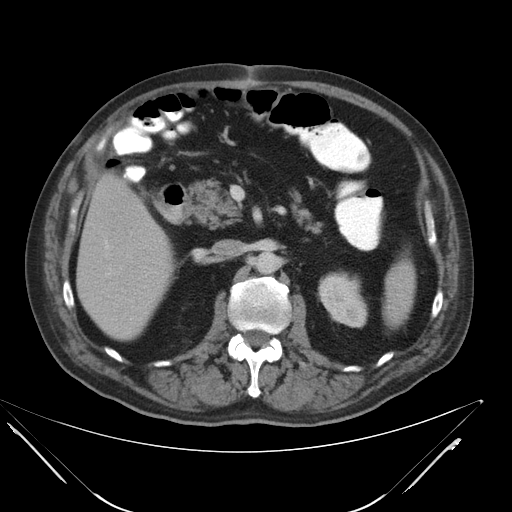
[im 82/102  soft-tissue]
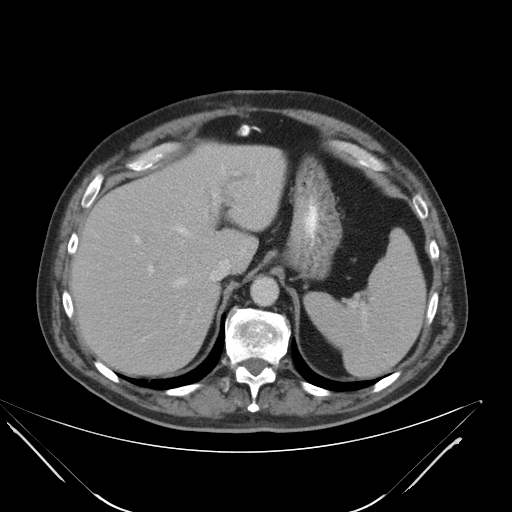
[im 92/102  soft-tissue]
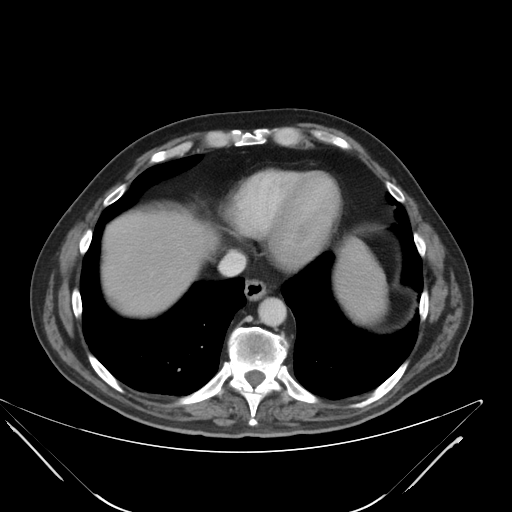

[Series 203: coronals, idose (2) · coronal · 0.50mm/px · 3 of 166 slices shown]
[im 56/166  soft-tissue]
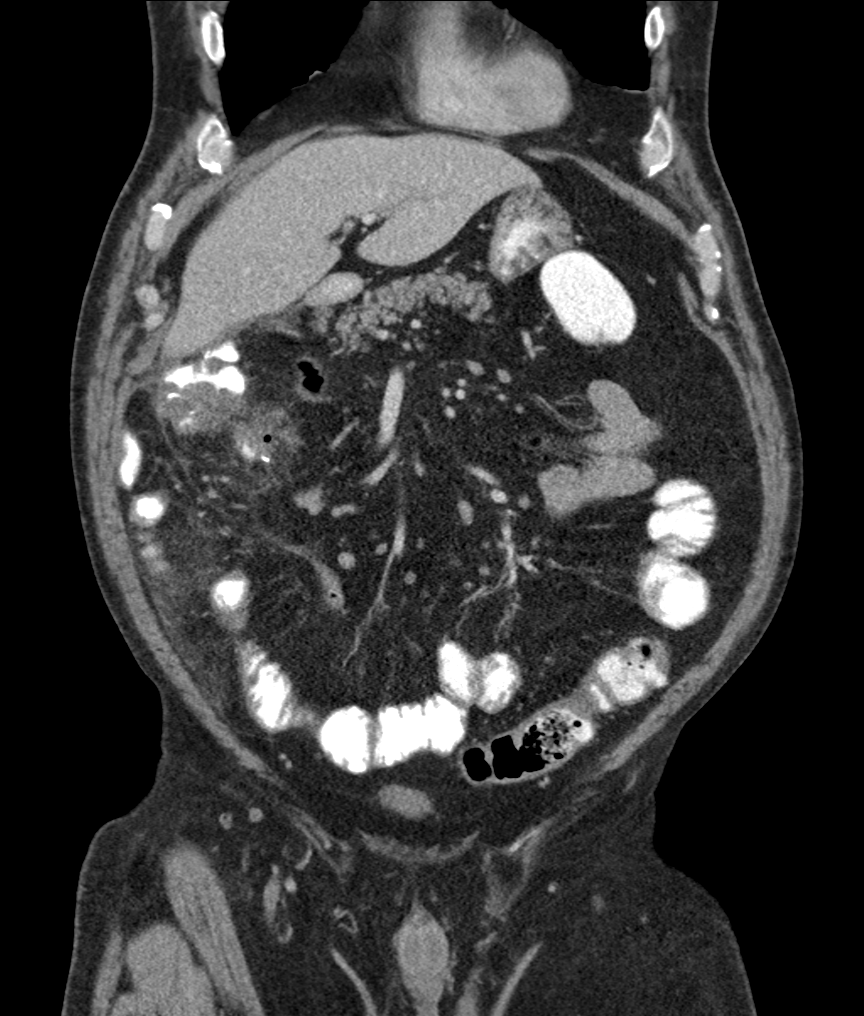
[im 74/166  soft-tissue]
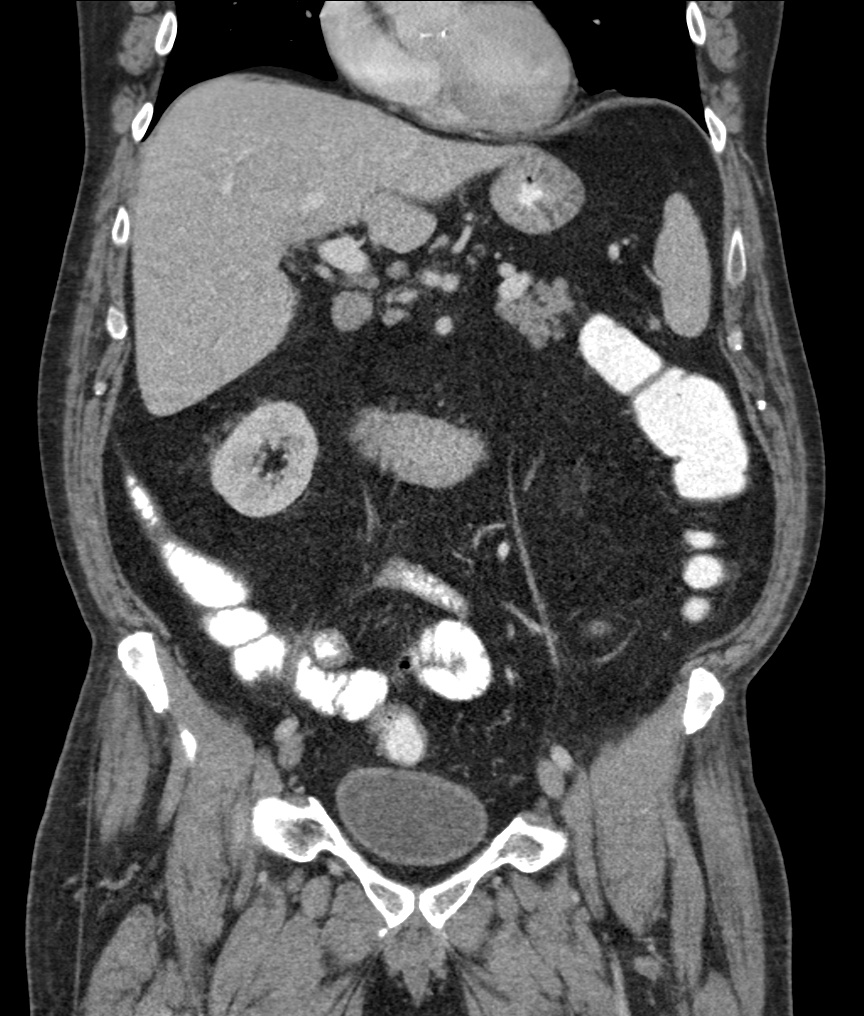
[im 92/166  soft-tissue]
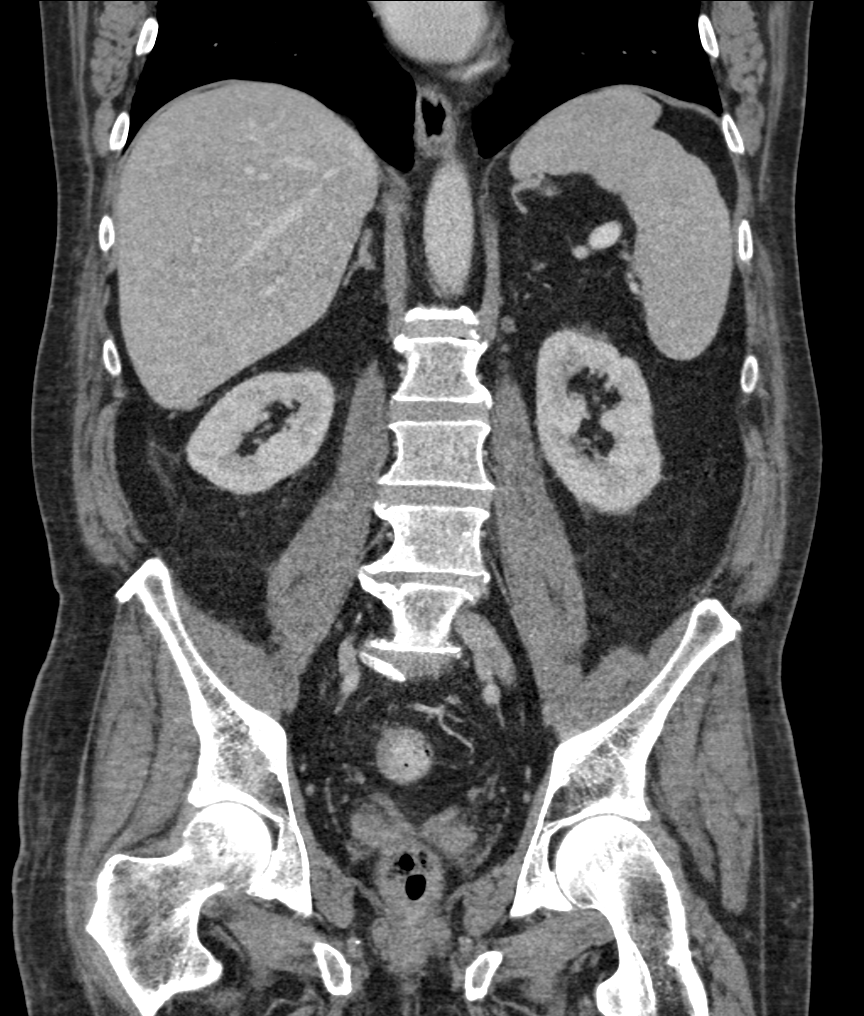

[11 of 46 positions shown; findings below may reference images not displayed]

FINDINGS: No pleural effusion identified. Significant interval improvement in
aeration to both lower lobes.

There is no focal liver abnormality. Previous cholecystectomy. No
biliary dilatation. Normal appearance of the pancreas. The spleen is
on unremarkable.

The adrenal glands are both normal. Normal appearance of the
kidneys. The right kidney is slightly malrotated. The urinary
bladder appears normal. Prostate gland and seminal vesicles are
unremarkable.

Mild calcified atherosclerotic disease involves the abdominal aorta.
There is no retroperitoneal adenopathy. Multiple small mesenteric
lymph nodes are identified. No mesenteric adenopathy. There is no
pelvic or inguinal adenopathy. No significant ascites identified
within the abdomen or pelvis. No fluid collections identified to
suggest abscess.

There is a small hiatal hernia. The stomach is otherwise on
unremarkable. Increase caliber of the small bowel loops measures up
to 4 cm. The patient is status post right hemicolectomy. The intra
colonic anastomosis within the right upper quadrant of the abdomen
is patent. Enteric contrast material is identified within the colon
up to the rectum.

Review of the visualized osseous structures is significant for mild
degenerative disc disease at L5-S1.
IMPRESSION: 1. Postoperative change compatible with right hemicolectomy and
intra colonic anastomosis.
2. Increase caliber of small bowel loops without evidence for
high-grade bowel obstruction. Enteric contrast material is seen
throughout the small bowel and colon.
3. No evidence for bowel perforation or abscess.

## 2016-11-16 ENCOUNTER — Encounter: Payer: Self-pay | Admitting: Gastroenterology

## 2016-11-16 ENCOUNTER — Encounter (INDEPENDENT_AMBULATORY_CARE_PROVIDER_SITE_OTHER): Payer: Self-pay

## 2016-11-16 ENCOUNTER — Other Ambulatory Visit (INDEPENDENT_AMBULATORY_CARE_PROVIDER_SITE_OTHER): Payer: Medicare Other

## 2016-11-16 ENCOUNTER — Ambulatory Visit (INDEPENDENT_AMBULATORY_CARE_PROVIDER_SITE_OTHER): Payer: Medicare Other | Admitting: Gastroenterology

## 2016-11-16 VITALS — BP 128/64 | HR 80 | Ht 72.0 in | Wt 221.2 lb

## 2016-11-16 DIAGNOSIS — R12 Heartburn: Secondary | ICD-10-CM

## 2016-11-16 DIAGNOSIS — R195 Other fecal abnormalities: Secondary | ICD-10-CM

## 2016-11-16 DIAGNOSIS — D509 Iron deficiency anemia, unspecified: Secondary | ICD-10-CM | POA: Diagnosis not present

## 2016-11-16 LAB — CBC WITH DIFFERENTIAL/PLATELET
BASOS ABS: 0.1 10*3/uL (ref 0.0–0.1)
Basophils Relative: 0.6 % (ref 0.0–3.0)
EOS ABS: 0.1 10*3/uL (ref 0.0–0.7)
Eosinophils Relative: 1.2 % (ref 0.0–5.0)
HCT: 40.8 % (ref 39.0–52.0)
HEMOGLOBIN: 14 g/dL (ref 13.0–17.0)
LYMPHS ABS: 2 10*3/uL (ref 0.7–4.0)
Lymphocytes Relative: 20.9 % (ref 12.0–46.0)
MCHC: 34.2 g/dL (ref 30.0–36.0)
MCV: 96 fl (ref 78.0–100.0)
MONO ABS: 0.6 10*3/uL (ref 0.1–1.0)
Monocytes Relative: 6 % (ref 3.0–12.0)
NEUTROS PCT: 71.3 % (ref 43.0–77.0)
Neutro Abs: 7 10*3/uL (ref 1.4–7.7)
Platelets: 188 10*3/uL (ref 150.0–400.0)
RBC: 4.26 Mil/uL (ref 4.22–5.81)
RDW: 13.7 % (ref 11.5–15.5)
WBC: 9.8 10*3/uL (ref 4.0–10.5)

## 2016-11-16 LAB — IBC PANEL
IRON: 86 ug/dL (ref 42–165)
SATURATION RATIOS: 26.4 % (ref 20.0–50.0)
TRANSFERRIN: 233 mg/dL (ref 212.0–360.0)

## 2016-11-16 LAB — FERRITIN: FERRITIN: 189.1 ng/mL (ref 22.0–322.0)

## 2016-11-16 NOTE — Progress Notes (Signed)
[facility] Progress Note  Chief Complaint: Heartburn  Subjective  History:  This is a 66 year old man referred by his surgeon for evaluation of recent digestive symptoms. Stony was previously seen by Dr. Deatra Ina, most recently in June 2016. 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  The patient did not want to take cholestyramine because the powder did not taste good. He apparently had no improvement on Imodium or Carafate, and so was prescribed Lomotil. Last colonoscopy in August 2015 shows the right colon anastomosis, biopsies negative for microscopic colitis, and a diminutive hyperplastic polyp removed.  Angelique Blonder is here with his wife today. He is a somewhat difficult historian, preferring to have his wife tell the story. He has had at least several months of frequent pyrosis which she takes a lot of Tums. He describes his stool as being dark at times. His upper digestive symptoms of that no clear food trigger. His wife does state that for many years he tends to have a bowel movement soon after meals. The diarrhea has been improved over the last year since his primary care provider gave him colestipol tablets, which she takes once daily. He denies dysphagia, odynophagia, nausea or vomiting. His appetite is apparently good and his weight stable as far as he can tell.  He started iron tablets when his primary care provider told him he was anemic several months ago.  ROS: Cardiovascular:  no chest pain Respiratory: no dyspnea  The patient's Past Medical, Family and Social History were reviewed and are on file in the EMR.  Objective:  Med list reviewed  Current Outpatient Prescriptions:  .  aspirin 81 MG chewable tablet, Chew 81 mg by mouth daily., Disp: , Rfl:  .  Cholecalciferol (VITAMIN D-1000 MAX ST) 1000 units tablet, Take 1 tablet by mouth daily., Disp: , Rfl:  .  clomiPRAMINE  (ANAFRANIL) 50 MG capsule, Take 50 mg by mouth at bedtime., Disp: , Rfl:  .  colestipol (COLESTID) 1 g tablet, Take 1 g by mouth daily., Disp: , Rfl: 0 .  Cyanocobalamin (VITAMIN B-12) 5000 MCG TBDP, Take 1 tablet by mouth daily., Disp: , Rfl:  .  famotidine (PEPCID) 20 MG tablet, Take 1 tablet (20 mg total) by mouth 2 (two) times daily., Disp: , Rfl:  .  ferrous sulfate 325 (65 FE) MG EC tablet, Take 325 mg by mouth daily with breakfast., Disp: , Rfl:  .  loperamide (ANTI-DIARRHEAL) 2 MG tablet, Take 2 mg by mouth 4 (four) times daily as needed for diarrhea or loose stools., Disp: , Rfl:  .  Multiple Vitamins-Minerals (CENTRUM SILVER PO), Take 1 tablet by mouth daily., Disp: , Rfl:  .  Potassium Gluconate 595 MG CAPS, Take 1 capsule by mouth daily., Disp: , Rfl:  .  saccharomyces boulardii (FLORASTOR) 250 MG capsule, Take 1 capsule (250 mg total) by mouth 2 (two) times daily. (Patient taking differently: Take 250 mg by mouth daily. ), Disp: 60 capsule, Rfl: 1  Past Medical History:  Diagnosis Date  . Arthritis   . Asthma    if have cold  . Gallstones   . GERD (gastroesophageal reflux disease)   . HOH (hard of hearing)   . Hyperlipidemia   . Hypokalemia   . Pneumonia    hx   Past Surgical History:  Procedure Laterality Date  . APPENDECTOMY    . BACK SURGERY     lower  slipped disc  . CHOLECYSTECTOMY N/A 04/24/2013   Procedure: LAPAROSCOPIC CHOLECYSTECTOMY WITH INTRAOPERATIVE CHOLANGIOGRAM;  Surgeon: [name]. Cornett, MD;  Location: [facility];  Service: General;  Laterality: N/A;  attempted  . CHOLECYSTECTOMY  04/24/2013   Procedure: CHOLECYSTECTOMY;  Surgeon: [name]. Cornett, MD;  Location: [facility];  Service: General;;  . LAPAROSCOPIC PARTIAL COLECTOMY N/A 08/16/2013   Procedure: ATTEMPTED LAPAROSCOPIC PARTIAL COLECTOMY CONVERTED TO OPEN;  Surgeon: Marcello Moores A. Cornett, MD;  Location: [facility];  Service: General;  Laterality: N/A;  . LYSIS OF ADHESION N/A 04/24/2013   Procedure: LYSIS OF  ADHESION;  Surgeon: [name]. Cornett, MD;  Location: [facility];  Service: General;  Laterality: N/A;  . PARTIAL COLECTOMY  08/16/2013   OPEN      BY CORNETT  . PERIPHERALLY INSERTED CENTRAL CATHETER INSERTION Right 09/27/13  . SMALL INTESTINE SURGERY     intestine blockage   Right hemicolectomy for benign TVA  Vital signs in last 24 hrs: Vitals:   11/16/16 0920  BP: 128/64  Pulse: 80    Physical Exam    HEENT: sclera anicteric, oral mucosa moist without lesions  Neck: supple, no thyromegaly, JVD or lymphadenopathy  Cardiac: RRR without murmurs, S1S2 heard, no peripheral edema  Pulm: clear to auscultation bilaterally, normal RR and effort noted  Abdomen: soft, No tenderness, with active bowel sounds. No guarding or palpable hepatosplenomegaly. Multiple abdominal scars  Skin; warm and dry, no jaundice or rash Rectal: Normal sphincter tone, soft, light brown heme positive stool, no tenderness or palpable internal lesion Normal external exam  Recent Labs:  CBC Latest Ref Rng & Units 11/16/2016 12/13/2013 09/14/2013  WBC 4.0 - 10.5 K/uL 9.8 9.0 11.3(H)  Hemoglobin 13.0 - 17.0 g/dL 14.0 13.4 10.2(L)  Hematocrit 39.0 - 52.0 % 40.8 39.2 29.3(L)  Platelets 150.0 - 400.0 K/uL 188.0 184.0 138(L)   Labs resulted after the office visit today.  @ASSESSMENTPLANBEGIN @ Assessment: Encounter Diagnoses  Name Primary?  . Heartburn Yes  . Heme positive stool   . Iron deficiency anemia, unspecified iron deficiency anemia type     The nature of his symptoms is unclear at present. It is a definite change for him, and it is also difficult to tell how it is related to the heme positive stool. If he was iron deficient several months ago, he is no longer after taking iron supplements.  Plan:  I changed him from the H2 blocker to a once daily PPI Upper endoscopy tomorrow. He is agreeable after discussion of the procedure and risks.  The benefits and risks of the planned procedure were  described in detail with the patient or (when appropriate) their health care proxy.  Risks were outlined as including, but not limited to, bleeding, infection, perforation, adverse medication reaction leading to cardiac or pulmonary decompensation, or pancreatitis (if ERCP).  The limitation of incomplete mucosal visualization was also discussed.  No guarantees or warranties were given.   Total time 45 minutes, over half spent in record review, counseling and coordination of care.   Nelida Meuse III

## 2016-11-16 NOTE — Patient Instructions (Addendum)
If you are age 65 or older, your body mass index should be between 23-30. Your Body mass index is 30 kg/m. If this is out of the aforementioned range listed, please consider follow up with your Primary Care Provider.  If you are age 81 or younger, your body mass index should be between 19-25. Your Body mass index is 30 kg/m. If this is out of the aformentioned range listed, please consider follow up with your Primary Care Provider.   You have been scheduled for an endoscopy. Please follow written instructions given to you at your visit today. If you use inhalers (even only as needed), please bring them with you on the day of your procedure. Your physician has requested that you go to www.startemmi.com and enter the access code given to you at your visit today. This web site gives a general overview about your procedure. However, you should still follow specific instructions given to you by our office regarding your preparation for the procedure.  Your physician has requested that you go to the basement for lab work before leaving today.  Stop Pepcid and start over the counter omeprazole 20 mg a day.  Thank you for choosing [facility]  Dr Wilfrid Lund III

## 2016-11-17 ENCOUNTER — Encounter: Payer: Self-pay | Admitting: Gastroenterology

## 2016-11-17 ENCOUNTER — Ambulatory Visit (AMBULATORY_SURGERY_CENTER): Payer: Medicare Other | Admitting: Gastroenterology

## 2016-11-17 VITALS — BP 125/66 | HR 64 | Temp 98.0°F | Resp 17 | Ht 72.0 in | Wt 221.0 lb

## 2016-11-17 DIAGNOSIS — R12 Heartburn: Secondary | ICD-10-CM | POA: Diagnosis not present

## 2016-11-17 DIAGNOSIS — R195 Other fecal abnormalities: Secondary | ICD-10-CM | POA: Diagnosis not present

## 2016-11-17 MED ORDER — SODIUM CHLORIDE 0.9 % IV SOLN: 500.0000 mL | INTRAVENOUS | Status: DC

## 2016-11-17 NOTE — Op Note (Signed)
[name]: Zachary Kim Procedure Date: 11/17/2016 10:56 AM MRN: [id number] Endoscopist: K[place]. Loletha Carrow , MD Age: 66 Referring MD:  Date of Birth: [birth year] Gender: Male Account #: [id number] Procedure:                Upper GI endoscopy Indications:              Heartburn, Heme positive stool Medicines:                Monitored Anesthesia Care Procedure:                Pre-Anesthesia Assessment:                           - Prior to the procedure, a History and Physical                            was performed, and patient medications and                            allergies were reviewed. The patient's tolerance of                            previous anesthesia was also reviewed. The risks                            and benefits of the procedure and the sedation                            options and risks were discussed with the patient.                            All questions were answered, and informed consent                            was obtained. Anticoagulants: The patient has taken                            aspirin. It was decided not to withhold this                            medication prior to the procedure. ASA Grade                            Assessment: II - A patient with mild systemic                            disease. After reviewing the risks and benefits,                            the patient was deemed in satisfactory condition to                            undergo the procedure.  After obtaining informed consent, the endoscope was                            passed under direct vision. Throughout the                            procedure, the patient's blood pressure, pulse, and                            oxygen saturations were monitored continuously. The                            Endoscope was introduced through the mouth, and                            advanced to the second part of duodenum. The upper                             GI endoscopy was accomplished without difficulty.                            The patient tolerated the procedure well. Scope In: Scope Out: Findings:                 The larynx was normal.                           Savary-Miller Grade II (multiple lesions and folds,                            noncircumferential, with or without confluence)                            esophagitis with no bleeding was found in the                            distal esophagus.                           There were esophageal mucosal changes suspicious                            for long-segment Barrett's esophagus present in the                            lower third of the esophagus. The maximum                            longitudinal extent of these mucosal changes was 3                            cm in length.There were no raised or suspicious                            areas within it. Mucosa was  biopsied with a cold                            forceps for histology in 4 quadrants at 37, 38 and                            40 cm from the incisors. A total of 3 specimen                            bottles were sent to pathology.                           A 3 cm hiatal hernia was present.                           The stomach was normal.                           The cardia and gastric fundus were normal on                            retroflexion.                           A few small non-bleeding diverticulae was found in                            the second portion of the duodenum. Complications:            No immediate complications. Estimated Blood Loss:     Estimated blood loss was minimal. Impression:               - Normal larynx.                           - Savary-Miller Grade II reflux esophagitis.                           - Esophageal mucosal changes suspicious for                            long-segment Barrett's esophagus. Biopsied.                           - 3 cm  hiatal hernia.                           - Normal stomach.                           - Non-bleeding duodenal diverticulum. Recommendation:           - Patient has a contact number available for                            emergencies. The signs and symptoms of potential  delayed complications were discussed with the                            patient. Return to normal activities tomorrow.                            Written discharge instructions were provided to the                            patient.                           - Resume previous diet.                           - Continue present medications with the exception                            of iron tablets. The iron can be stopped because                            CBC yesterday was normal. The patient was just                            changed from H2RA to PPI yesterday.                           - Await pathology results.                           - Follow an antireflux regimen indefinitely.                           - Perform a colonoscopy at appointment to be                            scheduled because of heme positive stool. Nthony Lefferts L. Loletha Carrow, MD 11/17/2016 11:28:25 AM This report has been signed electronically.

## 2016-11-17 NOTE — Progress Notes (Signed)
To recoery, report to RN, VSS

## 2016-11-17 NOTE — Patient Instructions (Signed)
YOU HAD AN ENDOSCOPIC PROCEDURE TODAY AT [facility]:   Refer to the procedure report that was given to you for any specific questions about what was found during the examination.  If the procedure report does not answer your questions, please call your gastroenterologist to clarify.  If you requested that your care partner not be given the details of your procedure findings, then the procedure report has been included in a sealed envelope for you to review at your convenience later.  YOU SHOULD EXPECT: Some feelings of bloating in the abdomen. Passage of more gas than usual.  Walking can help get rid of the air that was put into your GI tract during the procedure and reduce the bloating. If you had a lower endoscopy (such as a colonoscopy or flexible sigmoidoscopy) you may notice spotting of blood in your stool or on the toilet paper. If you underwent a bowel prep for your procedure, you may not have a normal bowel movement for a few days.  Please Note:  You might notice some irritation and congestion in your nose or some drainage.  This is from the oxygen used during your procedure.  There is no need for concern and it should clear up in a day or so.  SYMPTOMS TO REPORT IMMEDIATELY:   Following upper endoscopy (EGD)  Vomiting of blood or coffee ground material  New chest pain or pain under the shoulder blades  Painful or persistently difficult swallowing  New shortness of breath  Fever of 100F or higher  Black, tarry-looking stools  For urgent or emergent issues, a gastroenterologist can be reached at any hour by calling [phone].   DIET:  We do recommend a small meal at first, but then you may proceed to your regular diet.  Drink plenty of fluids but you should avoid alcoholic beverages for 24 hours.  MEDICATIONS: Continue present medications with the exception of Iron tablets. The Iron tablets can be stopped because the CBC yesterday was normal. The patient was  changed from H2RA to PPI yesterday.  FOLLOW UP: Schedule a colonoscopy because of heme (blood) positive stool.  Follow an antireflux regimen indefinitely. Please see handouts given to you by your recovery nurse.  ACTIVITY:  You should plan to take it easy for the rest of today and you should NOT DRIVE or use heavy machinery until tomorrow (because of the sedation medicines used during the test).    FOLLOW UP: Our staff will call the number listed on your records the next business day following your procedure to check on you and address any questions or concerns that you may have regarding the information given to you following your procedure. If we do not reach you, we will leave a message.  However, if you are feeling well and you are not experiencing any problems, there is no need to return our call.  We will assume that you have returned to your regular daily activities without incident.  If any biopsies were taken you will be contacted by phone or by letter within the next 1-3 weeks.  Please call us at [phone] if you have not heard about the biopsies in 3 weeks.   Thank you for allowing [place] to provide for your healthcare needs today.   SIGNATURES/CONFIDENTIALITY: You and/or your care partner have signed paperwork which will be entered into your electronic medical record.  These signatures attest to the fact that that the information above on your After Visit Summary has  been reviewed and is understood.  Full responsibility of the confidentiality of this discharge information lies with you and/or your care-partner.

## 2016-11-17 NOTE — Progress Notes (Signed)
Called to room to assist during endoscopic procedure.  Patient ID and intended procedure confirmed with present staff. Received instructions for my participation in the procedure from the performing physician.  

## 2016-11-18 ENCOUNTER — Telehealth: Payer: Self-pay

## 2016-11-18 ENCOUNTER — Telehealth: Payer: Self-pay | Admitting: Gastroenterology

## 2016-11-18 ENCOUNTER — Telehealth: Payer: Self-pay | Admitting: *Deleted

## 2016-11-18 NOTE — Telephone Encounter (Signed)
Please schedule a colonoscopy for this patient (see EGD report from yesterday). I would like it done within the next 4 weeks.  Diagnosis:   Heme positive stool  OK for LEC, Miralax prep will be fine.  That should save a trip to the office for instructions and prescription.

## 2016-11-18 NOTE — Telephone Encounter (Signed)
Attempted to reach pt. With follow up call following endoscopic procedure 11/17/2016.  LM on pt. Ans. Machine.  Will try to reach pt. Again later today.

## 2016-11-18 NOTE — Telephone Encounter (Signed)
  Follow up Call-  Call back number 11/17/2016  Post procedure Call Back phone  # [phone]  Permission to leave phone message No  Some recent data might be hidden     Patient questions:    Message left to call us if necessary.

## 2016-11-18 NOTE — Telephone Encounter (Signed)
Left message to call office back, need to schedule colonoscopy.

## 2016-11-18 NOTE — Telephone Encounter (Signed)
Attempted to reach pt. With follow up call following endoscopic procedure 11/17/2016.  LM on pt. Ans. Machine to call if he has any questions or concerns.

## 2016-11-19 NOTE — Telephone Encounter (Signed)
Called and had to leave voice message again on patient's preferred, home, number. Asked him to contact office so that this can be scheduled. I need to speak to patient about prep instructions.

## 2016-11-23 ENCOUNTER — Encounter: Payer: Self-pay | Admitting: Gastroenterology

## 2016-11-23 NOTE — Telephone Encounter (Signed)
I have called patient twice and left voice messages on his preferred number, patient has not called back. I called his mobile number and patient hung up. I will send a letter to patient asking him to call back to schedule colonoscopy.

## 2016-11-23 NOTE — Telephone Encounter (Signed)
Understood. He and his wife know that a colonoscopy is the plan (discussed with both after EGD), and they know how to contact us when ready.  Agree with the letter.

## 2016-11-24 ENCOUNTER — Encounter: Payer: Self-pay | Admitting: Gastroenterology

## 2016-12-28 ENCOUNTER — Ambulatory Visit (AMBULATORY_SURGERY_CENTER): Payer: Self-pay

## 2016-12-28 VITALS — Ht 72.0 in | Wt 226.2 lb

## 2016-12-28 DIAGNOSIS — R195 Other fecal abnormalities: Secondary | ICD-10-CM

## 2016-12-28 MED ORDER — NA SULFATE-K SULFATE-MG SULF 17.5-3.13-1.6 GM/177ML PO SOLN: 1.0000 | Freq: Once | ORAL | 0 refills | Status: AC

## 2016-12-28 NOTE — Progress Notes (Signed)
Per pt, no allergies to soy or egg products.Pt not taking any weight loss meds or using  O2 at home.   Pt refused Emmi video. 

## 2017-01-11 ENCOUNTER — Encounter: Payer: Self-pay | Admitting: Gastroenterology

## 2017-01-11 ENCOUNTER — Other Ambulatory Visit: Payer: Self-pay

## 2017-01-11 ENCOUNTER — Ambulatory Visit (AMBULATORY_SURGERY_CENTER): Payer: Medicare Other | Admitting: Gastroenterology

## 2017-01-11 VITALS — BP 141/73 | HR 66 | Temp 97.3°F | Resp 14 | Ht 72.0 in | Wt 226.0 lb

## 2017-01-11 DIAGNOSIS — R195 Other fecal abnormalities: Secondary | ICD-10-CM

## 2017-01-11 DIAGNOSIS — K633 Ulcer of intestine: Secondary | ICD-10-CM | POA: Diagnosis not present

## 2017-01-11 MED ORDER — SODIUM CHLORIDE 0.9 % IV SOLN: 500.0000 mL | INTRAVENOUS | Status: DC

## 2017-01-11 NOTE — Op Note (Signed)
[facility] Patient Name: Zachary Kim Procedure Date: 01/11/2017 10:07 AM MRN: [id number] Endoscopist: [place]. Loletha Carrow , MD Age: 66 Referring MD:  Date of Birth: 02-Nov-1950 Gender: Male Account #: [id number] Procedure:                Colonoscopy Indications:              Heme positive stool Medicines:                Monitored Anesthesia Care Procedure:                Pre-Anesthesia Assessment:                           - Prior to the procedure, a History and Physical                            was performed, and patient medications and                            allergies were reviewed. The patient's tolerance of                            previous anesthesia was also reviewed. The risks                            and benefits of the procedure and the sedation                            options and risks were discussed with the patient.                            All questions were answered, and informed consent                            was obtained. Anticoagulants: The patient has taken                            aspirin. It was decided not to withhold this                            medication prior to the procedure. ASA Grade                            Assessment: II - A patient with mild systemic                            disease. After reviewing the risks and benefits,                            the patient was deemed in satisfactory condition to                            undergo the procedure.  After obtaining informed consent, the colonoscope                            was passed under direct vision. Throughout the                            procedure, the patient's blood pressure, pulse, and                            oxygen saturations were monitored continuously. The                            Colonoscope was introduced through the anus and                            advanced to the the ileocolonic anastomosis and           neo-terminal ileum. The colonoscopy was performed                            without difficulty. The patient tolerated the                            procedure well. The quality of the bowel                            preparation was excellent. The rectum and                            Neo-terminal ileum and ileo-colic anastomosis were                            photographed. Scope In: 10:23:30 AM Scope Out: 10:31:12 AM Scope Withdrawal Time: 0 hours 5 minutes 45 seconds  Total Procedure Duration: 0 hours 7 minutes 42 seconds  Findings:                 The perianal and digital rectal examinations were                            normal.                           The neo-terminal ileum appeared normal.                           There was evidence of a prior end-to-side                            ileo-colonic anastomosis at the hepatic flexure.                            This was patent and was characterized by two                            beingn-appearing ulcerations. The anastomosis was  widely patent and easily traversed.                           Internal hemorrhoids were found. The hemorrhoids                            were Grade I (internal hemorrhoids that do not                            prolapse).                           The exam was otherwise without abnormality on                            direct and retroflexion views. Complications:            No immediate complications. Estimated Blood Loss:     Estimated blood loss: none. Impression:               - The examined portion of the ileum was normal.                           - Patent end-to-side ileo-colonic anastomosis,                            characterized by ulceration.                           - Internal hemorrhoids.                           - The examination was otherwise normal on direct                            and retroflexion views.                           - No specimens  collected. Recommendation:           - Patient has a contact number available for                            emergencies. The signs and symptoms of potential                            delayed complications were discussed with the                            patient. Return to normal activities tomorrow.                            Written discharge instructions were provided to the                            patient.                           -  Resume previous diet.                           - Continue present medications.                           - Patient advised to discuss discontinuation of                            aspirin if not strongly indicated.                           - Repeat colonoscopy in 10 years for screening                            purposes. Henry L. Loletha Carrow, MD 01/11/2017 10:41:53 AM This report has been signed electronically.

## 2017-01-11 NOTE — Patient Instructions (Signed)
YOU HAD AN ENDOSCOPIC PROCEDURE TODAY AT [facility]:   Refer to the procedure report that was given to you for any specific questions about what was found during the examination.  If the procedure report does not answer your questions, please call your gastroenterologist to clarify.  If you requested that your care partner not be given the details of your procedure findings, then the procedure report has been included in a sealed envelope for you to review at your convenience later.  YOU SHOULD EXPECT: Some feelings of bloating in the abdomen. Passage of more gas than usual.  Walking can help get rid of the air that was put into your GI tract during the procedure and reduce the bloating. If you had a lower endoscopy (such as a colonoscopy or flexible sigmoidoscopy) you may notice spotting of blood in your stool or on the toilet paper. If you underwent a bowel prep for your procedure, you may not have a normal bowel movement for a few days.  Please Note:  You might notice some irritation and congestion in your nose or some drainage.  This is from the oxygen used during your procedure.  There is no need for concern and it should clear up in a day or so.  SYMPTOMS TO REPORT IMMEDIATELY:   Following lower endoscopy (colonoscopy or flexible sigmoidoscopy):  Excessive amounts of blood in the stool  Significant tenderness or worsening of abdominal pains  Swelling of the abdomen that is new, acute  Fever of 100F or higher   Following upper endoscopy (EGD)  Vomiting of blood or coffee ground material  New chest pain or pain under the shoulder blades  Painful or persistently difficult swallowing  New shortness of breath  Fever of 100F or higher  Black, tarry-looking stools  For urgent or emergent issues, a gastroenterologist can be reached at any hour by calling [phone].   DIET:  We do recommend a small meal at first, but then you may proceed to your regular diet.  Drink  plenty of fluids but you should avoid alcoholic beverages for 24 hours.  ACTIVITY:  You should plan to take it easy for the rest of today and you should NOT DRIVE or use heavy machinery until tomorrow (because of the sedation medicines used during the test).    FOLLOW UP: Our staff will call the number listed on your records the next business day following your procedure to check on you and address any questions or concerns that you may have regarding the information given to you following your procedure. If we do not reach you, we will leave a message.  However, if you are feeling well and you are not experiencing any problems, there is no need to return our call.  We will assume that you have returned to your regular daily activities without incident.  If any biopsies were taken you will be contacted by phone or by letter within the next 1-3 weeks.  Please call us at [phone] if you have not heard about the biopsies in 3 weeks.    SIGNATURES/CONFIDENTIALITY: You and/or your care partner have signed paperwork which will be entered into your electronic medical record.  These signatures attest to the fact that that the information above on your After Visit Summary has been reviewed and is understood.  Full responsibility of the confidentiality of this discharge information lies with you and/or your care-partner.YOU HAD AN ENDOSCOPIC PROCEDURE TODAY AT [facility]:  Refer to the procedure report that was given to you for any specific questions about what was found during the examination.  If the procedure report does not answer your questions, please call your gastroenterologist to clarify.  If you requested that your care partner not be given the details of your procedure findings, then the procedure report has been included in a sealed envelope for you to review at your convenience later.  YOU SHOULD EXPECT: Some feelings of bloating in the abdomen. Passage of more gas than  usual.  Walking can help get rid of the air that was put into your GI tract during the procedure and reduce the bloating. If you had a lower endoscopy (such as a colonoscopy or flexible sigmoidoscopy) you may notice spotting of blood in your stool or on the toilet paper. If you underwent a bowel prep for your procedure, you may not have a normal bowel movement for a few days.  Please Note:  You might notice some irritation and congestion in your nose or some drainage.  This is from the oxygen used during your procedure.  There is no need for concern and it should clear up in a day or so.  SYMPTOMS TO REPORT IMMEDIATELY:   Following lower endoscopy (colonoscopy or flexible sigmoidoscopy):  Excessive amounts of blood in the stool  Significant tenderness or worsening of abdominal pains  Swelling of the abdomen that is new, acute  Fever of 100F or higher   Following upper endoscopy (EGD)  Vomiting of blood or coffee ground material  New chest pain or pain under the shoulder blades  Painful or persistently difficult swallowing  New shortness of breath  Fever of 100F or higher  Black, tarry-looking stools  For urgent or emergent issues, a gastroenterologist can be reached at any hour by calling [phone].   DIET:  We do recommend a small meal at first, but then you may proceed to your regular diet.  Drink plenty of fluids but you should avoid alcoholic beverages for 24 hours.  ACTIVITY:  You should plan to take it easy for the rest of today and you should NOT DRIVE or use heavy machinery until tomorrow (because of the sedation medicines used during the test).    FOLLOW UP: Our staff will call the number listed on your records the next business day following your procedure to check on you and address any questions or concerns that you may have regarding the information given to you following your procedure. If we do not reach you, we will leave a message.  However, if you are feeling  well and you are not experiencing any problems, there is no need to return our call.  We will assume that you have returned to your regular daily activities without incident.  If any biopsies were taken you will be contacted by phone or by letter within the next 1-3 weeks.  Please call us at [phone] if you have not heard about the biopsies in 3 weeks.    SIGNATURES/CONFIDENTIALITY: You and/or your care partner have signed paperwork which will be entered into your electronic medical record.  These signatures attest to the fact that that the information above on your After Visit Summary has been reviewed and is understood.  Full responsibility of the confidentiality of this discharge information lies with you and/or your care-partner.YOU HAD AN ENDOSCOPIC PROCEDURE TODAY AT [facility]:   Refer to the procedure report that was given to you for any  specific questions about what was found during the examination.  If the procedure report does not answer your questions, please call your gastroenterologist to clarify.  If you requested that your care partner not be given the details of your procedure findings, then the procedure report has been included in a sealed envelope for you to review at your convenience later.  YOU SHOULD EXPECT: Some feelings of bloating in the abdomen. Passage of more gas than usual.  Walking can help get rid of the air that was put into your GI tract during the procedure and reduce the bloating. If you had a lower endoscopy (such as a colonoscopy or flexible sigmoidoscopy) you may notice spotting of blood in your stool or on the toilet paper. If you underwent a bowel prep for your procedure, you may not have a normal bowel movement for a few days.  Please Note:  You might notice some irritation and congestion in your nose or some drainage.  This is from the oxygen used during your procedure.  There is no need for concern and it should clear up in a day or  so.  SYMPTOMS TO REPORT IMMEDIATELY:   Following lower endoscopy (colonoscopy or flexible sigmoidoscopy):  Excessive amounts of blood in the stool  Significant tenderness or worsening of abdominal pains  Swelling of the abdomen that is new, acute  Fever of 100F or higher   Following upper endoscopy (EGD)  Vomiting of blood or coffee ground material  New chest pain or pain under the shoulder blades  Painful or persistently difficult swallowing  New shortness of breath  Fever of 100F or higher  Black, tarry-looking stools  For urgent or emergent issues, a gastroenterologist can be reached at any hour by calling [phone].   DIET:  We do recommend a small meal at first, but then you may proceed to your regular diet.  Drink plenty of fluids but you should avoid alcoholic beverages for 24 hours.  ACTIVITY:  You should plan to take it easy for the rest of today and you should NOT DRIVE or use heavy machinery until tomorrow (because of the sedation medicines used during the test).    FOLLOW UP: Our staff will call the number listed on your records the next business day following your procedure to check on you and address any questions or concerns that you may have regarding the information given to you following your procedure. If we do not reach you, we will leave a message.  However, if you are feeling well and you are not experiencing any problems, there is no need to return our call.  We will assume that you have returned to your regular daily activities without incident.  If any biopsies were taken you will be contacted by phone or by letter within the next 1-3 weeks.  Please call us at [phone] if you have not heard about the biopsies in 3 weeks.    SIGNATURES/CONFIDENTIALITY: You and/or your care partner have signed paperwork which will be entered into your electronic medical record.  These signatures attest to the fact that that the information above on your After  Visit Summary has been reviewed and is understood.  Full responsibility of the confidentiality of this discharge information lies with you and/or your care-partner.  Hemorrhoid information given.  Recall colonoscopy 10 years-2028

## 2017-01-11 NOTE — Progress Notes (Signed)
Pt's states no medical or surgical changes since previsit or office visit. 

## 2017-01-11 NOTE — Progress Notes (Signed)
Report given to PACU, vss 

## 2017-01-12 ENCOUNTER — Telehealth: Payer: Self-pay

## 2017-01-12 NOTE — Telephone Encounter (Signed)
  Follow up Call-  Call back number 01/11/2017 11/17/2016  Post procedure Call Back phone  # [phone]  Permission to leave phone message Yes No  Some recent data might be hidden     Patient questions:  Do you have a fever, pain , or abdominal swelling? No. Pain Score  0 *  Have you tolerated food without any problems? Yes.    Have you been able to return to your normal activities? Yes.    Do you have any questions about your discharge instructions: Diet   No. Medications  No. Follow up visit  No.  Do you have questions or concerns about your Care? No.  Actions: * If pain score is 4 or above: No action needed, pain <4.

## 2018-05-15 ENCOUNTER — Emergency Department (HOSPITAL_COMMUNITY): Payer: Medicare Other

## 2018-05-15 ENCOUNTER — Emergency Department (HOSPITAL_COMMUNITY)
Admission: EM | Admit: 2018-05-15 | Discharge: 2018-05-15 | Disposition: A | Discharge status: Home / Self Care | Payer: Medicare Other | Attending: Emergency Medicine | Admitting: Emergency Medicine

## 2018-05-15 ENCOUNTER — Other Ambulatory Visit: Payer: Self-pay

## 2018-05-15 ENCOUNTER — Other Ambulatory Visit (HOSPITAL_COMMUNITY): Payer: Self-pay

## 2018-05-15 ENCOUNTER — Encounter (HOSPITAL_COMMUNITY): Payer: Self-pay | Admitting: *Deleted

## 2018-05-15 DIAGNOSIS — Y9389 Activity, other specified: Secondary | ICD-10-CM | POA: Diagnosis not present

## 2018-05-15 DIAGNOSIS — Y929 Unspecified place or not applicable: Secondary | ICD-10-CM | POA: Diagnosis not present

## 2018-05-15 DIAGNOSIS — Z87891 Personal history of nicotine dependence: Secondary | ICD-10-CM | POA: Insufficient documentation

## 2018-05-15 DIAGNOSIS — G8911 Acute pain due to trauma: Secondary | ICD-10-CM | POA: Insufficient documentation

## 2018-05-15 DIAGNOSIS — I10 Essential (primary) hypertension: Secondary | ICD-10-CM | POA: Insufficient documentation

## 2018-05-15 DIAGNOSIS — R102 Pelvic and perineal pain: Secondary | ICD-10-CM | POA: Diagnosis not present

## 2018-05-15 DIAGNOSIS — T1490XA Injury, unspecified, initial encounter: Secondary | ICD-10-CM | POA: Insufficient documentation

## 2018-05-15 DIAGNOSIS — W309XXA Contact with unspecified agricultural machinery, initial encounter: Secondary | ICD-10-CM | POA: Diagnosis not present

## 2018-05-15 DIAGNOSIS — Y999 Unspecified external cause status: Secondary | ICD-10-CM | POA: Insufficient documentation

## 2018-05-15 HISTORY — DX: Dorsalgia, unspecified: M54.9

## 2018-05-15 LAB — CBC WITH DIFFERENTIAL/PLATELET
Abs Immature Granulocytes: 0.12 10*3/uL — ABNORMAL HIGH (ref 0.00–0.07)
BASOS PCT: 0 %
Basophils Absolute: 0 10*3/uL (ref 0.0–0.1)
Eosinophils Absolute: 0 10*3/uL (ref 0.0–0.5)
Eosinophils Relative: 0 %
HCT: 42 % (ref 39.0–52.0)
Hemoglobin: 14.6 g/dL (ref 13.0–17.0)
Immature Granulocytes: 1 %
Lymphocytes Relative: 11 %
Lymphs Abs: 1.2 10*3/uL (ref 0.7–4.0)
MCH: 33.4 pg (ref 26.0–34.0)
MCHC: 34.8 g/dL (ref 30.0–36.0)
MCV: 96.1 fL (ref 80.0–100.0)
MONOS PCT: 7 %
Monocytes Absolute: 0.8 10*3/uL (ref 0.1–1.0)
Neutro Abs: 8.9 10*3/uL — ABNORMAL HIGH (ref 1.7–7.7)
Neutrophils Relative %: 81 %
Platelets: 133 10*3/uL — ABNORMAL LOW (ref 150–400)
RBC: 4.37 MIL/uL (ref 4.22–5.81)
RDW: 13.1 % (ref 11.5–15.5)
WBC: 11.1 10*3/uL — ABNORMAL HIGH (ref 4.0–10.5)
nRBC: 0 % (ref 0.0–0.2)

## 2018-05-15 LAB — BASIC METABOLIC PANEL
ANION GAP: 11 (ref 5–15)
BUN: 10 mg/dL (ref 8–23)
CO2: 21 mmol/L — ABNORMAL LOW (ref 22–32)
Calcium: 9.1 mg/dL (ref 8.9–10.3)
Chloride: 99 mmol/L (ref 98–111)
Creatinine, Ser: 1.22 mg/dL (ref 0.61–1.24)
GFR calc Af Amer: >60 mL/min (ref >60)
GFR calc non Af Amer: >60 mL/min (ref >60)
GLUCOSE: 136 mg/dL — AB (ref 70–99)
Potassium: 4.1 mmol/L (ref 3.5–5.1)
Sodium: 131 mmol/L — ABNORMAL LOW (ref 135–145)

## 2018-05-15 LAB — ABO/RH: ABO/RH(D): O POS

## 2018-05-15 MED ORDER — FENTANYL CITRATE (PF) 100 MCG/2ML IJ SOLN
100.0000 ug | INTRAMUSCULAR | Status: DC | PRN
  Administered 2018-05-15: 100 ug via INTRAVENOUS
  Filled 2018-05-15: qty 2

## 2018-05-15 MED ORDER — IOHEXOL 300 MG/ML  SOLN
100.0000 mL | Freq: Once | INTRAMUSCULAR | Status: AC | PRN
  Administered 2018-05-15: 100 mL via INTRAVENOUS

## 2018-05-15 MED ORDER — HYDROCODONE-ACETAMINOPHEN 5-325 MG PO TABS
1.0000 | ORAL_TABLET | Freq: Once | ORAL | Status: AC
  Administered 2018-05-15: 1 via ORAL
  Filled 2018-05-15: qty 1

## 2018-05-15 NOTE — ED Provider Notes (Signed)
[facility] EMERGENCY DEPARTMENT Provider Note   CSN: [id number] Arrival date & time: 05/15/18  1124    History   Chief Complaint Chief Complaint  Patient presents with  . Trauma    HPI Zachary Kim is a 68 y.o. male.     HPI Patient presents as a level 2 trauma. Patient states that he was in his usual state of health, when he had a piece of farm equipment fall onto his right hip, right arm. He was trapped for approximately 10 minutes, was able to free his arm himself, but required assistance with lifting the heavy equipment from his body. Patient has baseline numbness in his right leg, this is unchanged Pain was severe, sharp, worse with motion, improved after placement of a pelvic binder. No new loss of sensation distally, no incontinence, no abdominal pain, no dyspnea. EMS reports the patient was hypertensive in route, but pain improved with pelvic binder placement, as above. Past Medical History:  Diagnosis Date  . Back pain   . Hypertension     There are no active problems to display for this patient.   Past Surgical History:  Procedure Laterality Date  . ABDOMINAL SURGERY    . BACK SURGERY    . CHOLECYSTECTOMY          Home Medications    Prior to Admission medications   Not on File    Family History No family history on file.  Social History Social History   Tobacco Use  . Smoking status: Former Research scientist (life sciences)  . Smokeless tobacco: Current User    Types: Chew  Substance Use Topics  . Alcohol use: Yes  . Drug use: Never     Allergies   Patient has no allergy information on record.   Review of Systems Review of Systems  Unable to perform ROS: Acuity of condition     Physical Exam Updated Vital Signs BP (!) 154/83   Pulse (!) 102   Temp 97.7 F (36.5 C) (Temporal)   Resp 17   Ht 6\' 1"  (1.854 m)   Wt 99.8 kg   SpO2 100%   BMI 29.03 kg/m   Physical Exam Vitals signs and nursing note reviewed.   Constitutional:      General: He is not in acute distress.    Appearance: He is well-developed.     Comments: Uncomfortable appearing elderly male awake and alert  HENT:     Head: Normocephalic and atraumatic.  Eyes:     Conjunctiva/sclera: Conjunctivae normal.  Cardiovascular:     Rate and Rhythm: Regular rhythm. Tachycardia present.     Pulses: No decreased pulses.  Pulmonary:     Effort: Pulmonary effort is normal. No respiratory distress.     Breath sounds: No stridor.  Abdominal:     General: There is no distension.     Comments: Soft, nontender abdomen with multiple surgical scars, both vertically oriented and right upper quadrant.   Musculoskeletal:     Right knee: Normal.     Right ankle: Normal.       Legs:  Skin:    General: Skin is warm and dry.  Neurological:     Mental Status: He is alert and oriented to person, place, and time.      ED Treatments / Results  Labs (all labs ordered are listed, but only abnormal results are displayed) Labs Reviewed  CBC WITH DIFFERENTIAL/PLATELET - Abnormal; Notable for the following components:  Result Value   WBC 11.1 (*)    Platelets 133 (*)    Neutro Abs 8.9 (*)    Abs Immature Granulocytes 0.12 (*)    All other components within normal limits  BASIC METABOLIC PANEL - Abnormal; Notable for the following components:   Sodium 131 (*)    CO2 21 (*)    Glucose, Bld 136 (*)    All other components within normal limits  TYPE AND SCREEN  ABO/RH    EKG EKG Interpretation  Date/Time:  Monday May 15 2018 11:50:37 EDT Ventricular Rate:  96 PR Interval:    QRS Duration: 81 QT Interval:  339 QTC Calculation: 429 R Axis:   30 Text Interpretation:  Sinus rhythm Confirmed by Carmin Muskrat [phone]) on 05/15/2018 12:05:51 PM   Radiology Ct Abdomen Pelvis W Contrast  Result Date: 05/15/2018 CLINICAL DATA:  Pinned beneath combine with right pelvic pain, initial encounter EXAM: CT ABDOMEN AND PELVIS WITH CONTRAST  TECHNIQUE: Multidetector CT imaging of the abdomen and pelvis was performed using the standard protocol following bolus administration of intravenous contrast. CONTRAST:  124mL OMNIPAQUE IOHEXOL 300 MG/ML  SOLN COMPARISON:  None. FINDINGS: Lower chest: No acute abnormality. Hepatobiliary: Diffuse fatty infiltration of the liver is noted. The gallbladder has been surgically removed. Pancreas: Pancreas is within normal limits. Spleen: Normal in size without focal abnormality. Adrenals/Urinary Tract: Adrenal glands are unremarkable. Kidneys are normal, without renal calculi, focal lesion, or hydronephrosis. Bladder is unremarkable. Stomach/Bowel: The appendix is not well visualized and likely surgically removed given the changes in the right mid abdomen associated with the large and small bowel. No obstructive or inflammatory changes are seen. The stomach is within normal limits. Vascular/Lymphatic: Aortic atherosclerosis. No enlarged abdominal or pelvic lymph nodes. Reproductive: Prostate is unremarkable. Other: No abdominal wall hernia or abnormality. No abdominopelvic ascites. Musculoskeletal: No acute bony abnormality is noted. Degenerative changes of the lumbar spine are seen. Very minimal soft tissue edema is noted in the right inguinal region consistent with the recent injury. These changes are also noted in the lateral aspect of the right buttock. IMPRESSION: Very mild soft tissue changes in the region of the right inguinal canal and right buttock as described. No other acute abnormality is seen. Electronically Signed   By: Inez Catalina M.D.   On: 05/15/2018 14:01   Dg Pelvis Portable  Result Date: 05/15/2018 CLINICAL DATA:  Pinned beneath combine, initial encounter EXAM: PORTABLE PELVIS 1-2 VIEWS COMPARISON:  None. FINDINGS: Pelvic ring is intact. No definitive fracture is seen. No soft tissue abnormality is noted. IMPRESSION: No acute abnormality seen. Electronically Signed   By: Inez Catalina M.D.   On:  05/15/2018 12:45   Dg Chest Port 1 View  Result Date: 05/15/2018 CLINICAL DATA:  Pinned beneath combine with chest pain, initial encounter EXAM: PORTABLE CHEST 1 VIEW COMPARISON:  None. FINDINGS: The heart size and mediastinal contours are within normal limits. Both lungs are clear. The visualized skeletal structures are unremarkable. IMPRESSION: No active disease. Electronically Signed   By: Inez Catalina M.D.   On: 05/15/2018 12:45    Procedures Procedures (including critical care time)  Medications Ordered in ED Medications  fentaNYL (SUBLIMAZE) injection 100 mcg (100 mcg Intravenous Given 05/15/18 1255)  iohexol (OMNIPAQUE) 300 MG/ML solution 100 mL (100 mLs Intravenous Contrast Given 05/15/18 1347)     Initial Impression / Assessment and Plan / ED Course  I have reviewed the triage vital signs and the nursing notes.  Pertinent labs &  imaging results that were available during my care of the patient were reviewed by me and considered in my medical decision making (see chart for details).        3:23 PM Patient in no distress, awake and alert. Reviewed all findings including x-rays, CT scan, no evidence for soft tissue injury, no evidence for fracture. I confirmed the patient can move his leg, hand, he notes some stiffness in his hand, but states that is old. There are superficial abrasions, but otherwise no evidence for trauma about the hand, wrist. Absent other complaints, with reassuring findings as above, the patient was discharged in stable condition.  Final Clinical Impressions(s) / ED Diagnoses   Final diagnoses:  Trauma     Carmin Muskrat, MD 05/15/18 1524

## 2018-05-15 NOTE — ED Notes (Signed)
Patient states he is feeling a little better since the KED was removed.

## 2018-05-15 NOTE — ED Triage Notes (Signed)
States he was changing a tire on his combine and it shifted and busted the block that holding it up states he got pinned under the machine with his right lower  Pelvis and right  Arm. Abrasion to right elbow, and c/o pain in right lower abd. Area. [place] to right lower abd and upper right thigh Moves all ext x 4 , alert and oriented, he was able to get his arm out and called for help

## 2018-05-15 NOTE — ED Notes (Signed)
Patient stated he didn't want anything for pain now.

## 2018-05-15 NOTE — ED Notes (Signed)
Patient wanting to sit up states it hurts for him to lay on his back normally. Instructed not to sit up until his CT scan is completed.

## 2018-05-15 NOTE — ED Notes (Signed)
Transported to CT 

## 2018-05-15 NOTE — Progress Notes (Signed)
I responded to a Level 2 ED page from the nurse. I visited the ED but the patient was unavailable due to care from the medical team. I remained available for additional support as needed or requested.    05/15/18 1200  Clinical Encounter Type  Visited With Other (Comment)  Visit Type Spiritual support;ED;Trauma  Referral From Nurse  Consult/Referral To Chaplain  Spiritual Encounters  Spiritual Needs Prayer    Chaplain Dr Redgie Grayer

## 2018-05-15 NOTE — ED Notes (Signed)
Pt's sps. Nell Range [phone]. Pt's contact with updates

## 2018-05-15 NOTE — Discharge Instructions (Signed)
As discussed, your evaluation today has been largely reassuring.  But, it is important that you monitor your condition carefully, and do not hesitate to return to the ED if you develop new, or concerning changes in your condition. ? ?Otherwise, please follow-up with your physician for appropriate ongoing care. ? ?

## 2018-05-15 NOTE — ED Notes (Signed)
Lab results called to CT

## 2018-05-15 NOTE — ED Notes (Signed)
Actual arrival time 1143, pt roomed in error prior to arrival.

## 2018-05-15 NOTE — ED Notes (Signed)
Patient Alert and oriented to baseline. Stable and ambulatory to baseline. Patient verbalized understanding of the discharge instructions.  Patient belongings were taken by the patient.   

## 2018-05-16 ENCOUNTER — Encounter: Payer: Self-pay | Admitting: Gastroenterology

## 2018-05-16 LAB — TYPE AND SCREEN
ABO/RH(D): O POS
Antibody Screen: NEGATIVE

## 2020-03-31 ENCOUNTER — Encounter: Payer: Self-pay | Admitting: Gastroenterology

## 2022-03-19 ENCOUNTER — Encounter: Payer: Self-pay | Admitting: Physician Assistant

## 2022-04-19 NOTE — Progress Notes (Signed)
04/20/2022 Zachary Kim [id number] 1950-11-20  Referring provider: Suzan Garibaldi, FNP Primary GI doctor: Dr. Loletha Carrow  ASSESSMENT AND PLAN:   Barrett's esophagus without dysplasia EGD to evaluate for Barrett's/dysplasia Continue on the same PPI dose, reports symptoms are well controlled.  Tobacco cessation and ETOH cessation discussed in detail.  I discussed risks of EGD with patient today, including risk of sedation, bleeding or perforation.  Patient provides understanding and gave verbal consent to proceed.  Bile salt-induced diarrhea Normal colonoscopy 2015/ 2018 with no microscopic colitis s/p cholecystectomy  2015, better with colestid.  Continue colestid 1 gram twice daily, add on fiber.   Tubulovillous adenoma of colon benign S/P partial colectomy 12/2016 colonoscopy with recall 2028  Fatty liver seen on CT 2020, history of thrombocytopenia resolved FIB 4 is 1.72 --Continue to work on risk factor modification including diet exercise and control of risk factors including blood sugars. Discussed stopping ETOH.  - monitor q 6 months.  - consider AB [place]   Patient Care Team: Suzan Garibaldi, FNP as PCP - General (Nurse Practitioner) Suzan Garibaldi, FNP (Nurse Practitioner)  HISTORY OF PRESENT ILLNESS: 72 y.o. male with a past medical history of HTN, chol, Barret's, benign villous adenoma s/p right colectomy 09/2013, and others listed below presents for evaluation of barret's esophagus.   Right hemicolectomy for benign villous adenoma 08/2013 09/2013 colonoscopy right colon anastomosis biopsies negative for microscopic colitis and a diminutive hyperplastic polyp removed 12/2016 Colonoscopy recall 10 years  11/17/2016 EGD for GERD  Showed Barrett's esophagus without dysplasia, recall 3 years.  05/15/2018 CT AB and pelvis with contrast for trauma showed fatty liver  History of cholecystectomy 2015, had diarrhea since that time, controlled with colestid 1 g BID.  Has  some urgency in the morning with eating or not. He will have 1 BM daily, can be loose or formed. Much improved. Denies AB pain, hematochezia.   He is on the pantoprazole 20 mg in the morning before food most of the time.  He denies dysphagia, melena, AB pain.  He denies nausea, vomiting.  No unintentional weight loss, no night sweats. He denies nocturnal symptoms.   He denies NSAID use.  He reports ETOH use 3 beers every day for years.   Denies smoking tobacco, but does chew.  02/23/2022 labs from PCP reviewed: WBC 8.4, HGB 15.4, platelets 189, AST 18, ALT 15, alk phos 67, total bili 1.1, cr 1.0.   He  reports that he quit smoking about 39 years ago. His smoking use included cigarettes. He has a 24.00 pack-year smoking history. His smokeless tobacco use includes chew. He reports current alcohol use. He reports that he does not use drugs.  RELEVANT LABS AND IMAGING: CBC    Component Value Date/Time   WBC 11.1 (H) 05/15/2018 1150   RBC 4.37 05/15/2018 1150   HGB 14.6 05/15/2018 1150   HCT 42.0 05/15/2018 1150   PLT 133 (L) 05/15/2018 1150   MCV 96.1 05/15/2018 1150   MCH 33.4 05/15/2018 1150   MCHC 34.8 05/15/2018 1150   RDW 13.1 05/15/2018 1150   LYMPHSABS 1.2 05/15/2018 1150   MONOABS 0.8 05/15/2018 1150   EOSABS 0.0 05/15/2018 1150   BASOSABS 0.0 05/15/2018 1150   No results for input(s): "HGB" in the last 8760 hours.   CMP     Component Value Date/Time   NA 131 (L) 05/15/2018 1150   K 4.1 05/15/2018 1150   CL 99 05/15/2018 1150   CO2 21 (L)  05/15/2018 1150   GLUCOSE 136 (H) 05/15/2018 1150   BUN 10 05/15/2018 1150   CREATININE 1.22 05/15/2018 1150   CALCIUM 9.1 05/15/2018 1150   PROT 6.9 12/13/2013 0944   ALBUMIN 3.4 (L) 12/13/2013 0944   AST 15 12/13/2013 0944   ALT 19 12/13/2013 0944   ALKPHOS 61 12/13/2013 0944   BILITOT 0.7 12/13/2013 0944   GFRNONAA >60 05/15/2018 1150   GFRAA >60 05/15/2018 1150      Latest Ref Rng & Units 12/13/2013    9:44 AM  09/14/2013    4:35 AM 09/13/2013   12:00 PM  Hepatic Function  Total Protein 6.0 - 8.3 g/dL 6.9  5.4  6.5   Albumin 3.5 - 5.2 g/dL 3.4  2.5  3.0   AST 0 - 37 U/L '15  28  31   '$ ALT 0 - 53 U/L 19  26  33   Alk Phosphatase 39 - 117 U/L 61  69  78   Total Bilirubin 0.2 - 1.2 mg/dL 0.7  0.6  0.7       Current Medications:    Current Outpatient Medications (Cardiovascular):    amLODipine (NORVASC) 5 MG tablet, Take 5 mg by mouth daily.   colestipol (COLESTID) 1 g tablet, Take 1 g by mouth 2 (two) times daily.   Current Outpatient Medications (Analgesics):    acetaminophen (TYLENOL) 500 MG tablet, Take 500-1,000 mg by mouth every 6 (six) hours as needed (for pain).   aspirin 81 MG chewable tablet, Chew 81 mg by mouth daily.  Current Outpatient Medications (Hematological):    Cyanocobalamin (VITAMIN B-12) 5000 MCG TBDP, Take 1 tablet by mouth daily.  Current Outpatient Medications (Other):    Cholecalciferol (VITAMIN D-1000 MAX ST) 1000 units tablet, Take 1 tablet by mouth daily.   Cholecalciferol (VITAMIN D-3) 25 MCG (1000 UT) CAPS, Take 1,000-2,000 Units by mouth daily with breakfast.   Multiple Vitamins-Minerals (CENTRUM SILVER PO), Take 1 tablet by mouth daily.   pantoprazole (PROTONIX) 20 MG tablet, Take 20 mg daily by mouth.   Potassium Gluconate 595 MG CAPS, Take 1 capsule by mouth daily.   saccharomyces boulardii (FLORASTOR) 250 MG capsule, Take 1 capsule (250 mg total) by mouth 2 (two) times daily. (Patient taking differently: Take 250 mg by mouth daily.)   vitamin C (ASCORBIC ACID) 500 MG tablet, Take 500-1,000 mg by mouth daily.  Medical History:  Past Medical History:  Diagnosis Date   Arthritis    Asthma    if have cold   Back pain    Gallstones    GERD (gastroesophageal reflux disease)    HOH (hard of hearing)    no hearing aids   Hyperlipidemia    Hypertension    Hypokalemia    Pneumonia    hx   Allergies:  Allergies  Allergen Reactions   Tape Other (See  Comments)    Adhesive tape causes blistered skin   Tape Rash    (Only allergy noted in other chart in [facility] & [facility] had none on file)     Surgical History:  He  has a past surgical history that includes Small intestine surgery; Back surgery; Cholecystectomy (N/A, 04/24/2013); Cholecystectomy (04/24/2013); Lysis of adhesion (N/A, 04/24/2013); Partial colectomy (08/16/2013); Laparoscopic partial colectomy (N/A, 08/16/2013); Appendectomy; Peripherally inserted central catheter insertion (Right, 09/27/13); Colonoscopy; Back surgery; Cholecystectomy; and Abdominal surgery. Family History:  His family history includes Breast cancer in his sister; Diabetes in his mother; Heart disease in his father.  REVIEW  OF SYSTEMS  : All other systems reviewed and negative except where noted in the History of Present Illness.  PHYSICAL EXAM: BP 120/70   Pulse 79   Ht 6' (1.829 m)   Wt 231 lb (104.8 kg)   BMI 31.33 kg/m  General Appearance: Well nourished, in no apparent distress. Head:   Normocephalic and atraumatic. Ruddy/erythematous nose. Eyes:  sclerae anicteric,conjunctive pink  Respiratory: Respiratory effort normal, BS equal bilaterally without rales, rhonchi, wheezing. Cardio: RRR with no MRGs. Peripheral pulses intact.  Abdomen: Soft,  Obese ,active bowel sounds. No tenderness. Without guarding and Without rebound. No masses. Rectal: Not evaluated Musculoskeletal: Full ROM, Normal gait. Without edema. Skin:  Dry and intact without significant lesions or rashes Neuro: Alert and  oriented x4;  No focal deficits. Psych:  Cooperative. Normal mood and affect.    Vladimir Crofts, PA-C 12:02 PM

## 2022-04-20 ENCOUNTER — Ambulatory Visit (INDEPENDENT_AMBULATORY_CARE_PROVIDER_SITE_OTHER): Payer: Medicare Other | Admitting: Physician Assistant

## 2022-04-20 ENCOUNTER — Encounter: Payer: Self-pay | Admitting: Physician Assistant

## 2022-04-20 VITALS — BP 120/70 | HR 79 | Ht 72.0 in | Wt 231.0 lb

## 2022-04-20 DIAGNOSIS — K9089 Other intestinal malabsorption: Secondary | ICD-10-CM | POA: Diagnosis not present

## 2022-04-20 DIAGNOSIS — Z9049 Acquired absence of other specified parts of digestive tract: Secondary | ICD-10-CM | POA: Diagnosis not present

## 2022-04-20 DIAGNOSIS — K2270 Barrett's esophagus without dysplasia: Secondary | ICD-10-CM

## 2022-04-20 DIAGNOSIS — D126 Benign neoplasm of colon, unspecified: Secondary | ICD-10-CM

## 2022-04-20 DIAGNOSIS — K760 Fatty (change of) liver, not elsewhere classified: Secondary | ICD-10-CM

## 2022-04-20 NOTE — Patient Instructions (Addendum)
Please take your proton pump inhibitor medication, pantoprazole  Please take this medication 30 minutes to 1 hour before meals- this makes it more effective.  Avoid spicy and acidic foods Avoid fatty foods Limit your intake of coffee, tea, alcohol, and carbonated drinks Work to maintain a healthy weight Keep the head of the bed elevated at least 3 inches with blocks or a wedge pillow if you are having any nighttime symptoms Stay upright for 2 hours after eating Avoid meals and snacks three to four hours before bedtime   Suggest cutting back/stopping alcohol for your stomach, liver, and heart.   Metabolic dysfunction associated seatohepatitis  Now the leading cause of liver failure in the [place].  It is normally from such risk factors as obesity, diabetes, insulin resistance, high cholesterol, or metabolic syndrome.  The only definitive therapy is weight loss and exercise.   Suggest walking 20-30 mins daily.  Decreasing carbohydrates, increasing veggies.    Fatty Liver Fatty liver is the accumulation of fat in liver cells. It is also called hepatosteatosis or steatohepatitis. It is normal for your liver to contain some fat. If fat is more than 5 to 10% of your liver's weight, you have fatty liver.  There are often no symptoms (problems) for years while damage is still occurring. People often learn about their fatty liver when they have medical tests for other reasons. Fat can damage your liver for years or even decades without causing problems. When it becomes severe, it can cause fatigue, weight loss, weakness, and confusion. This makes you more likely to develop more serious liver problems. The liver is the largest organ in the body. It does a lot of work and often gives no warning signs when it is sick until late in a disease. The liver has many important jobs including: Breaking down foods. Storing vitamins, iron, and other minerals. Making proteins. Making bile for food  digestion. Breaking down many products including medications, alcohol and some poisons.  PROGNOSIS  Fatty liver may cause no damage or it can lead to an inflammation of the liver. This is, called steatohepatitis.  Over time the liver may become scarred and hardened. This condition is called cirrhosis. Cirrhosis is serious and may lead to liver failure or cancer. NASH is one of the leading causes of cirrhosis. About 10-20% of Americans have fatty liver and a smaller 2-5% has NASH.  TREATMENT  Weight loss, fat restriction, and exercise in overweight patients produces inconsistent results but is worth trying. Good control of diabetes may reduce fatty liver. Eat a balanced, healthy diet. Increase your physical activity. There are no medical or surgical treatments for a fatty liver or NASH, but improving your diet and increasing your exercise may help prevent or reverse some of the damage. You have been scheduled for an endoscopy. Please follow written instructions given to you at your visit today. If you use inhalers (even only as needed), please bring them with you on the day of your procedure.   Due to recent changes in healthcare laws, you may see the results of your imaging and laboratory studies on MyChart before your provider has had a chance to review them.  We understand that in some cases there may be results that are confusing or concerning to you. Not all laboratory results come back in the same time frame and the provider may be waiting for multiple results in order to interpret others.  Please give [place] 48 hours in order for your provider to  thoroughly review all the results before contacting the office for clarification of your results.    I appreciate the  opportunity to care for you  Thank You   [facility]

## 2022-04-21 NOTE — Progress Notes (Signed)
____________________________________________________________  Attending physician addendum:  Thank you for sending this case to me. I have reviewed the entire note and agree with the plan.   Agapito Hanway Danis, MD  ____________________________________________________________  

## 2022-04-29 ENCOUNTER — Encounter: Payer: Self-pay | Admitting: Physician Assistant

## 2022-05-11 ENCOUNTER — Encounter: Payer: Self-pay | Admitting: Gastroenterology

## 2022-05-11 ENCOUNTER — Ambulatory Visit (AMBULATORY_SURGERY_CENTER): Payer: Medicare Other | Admitting: Gastroenterology

## 2022-05-11 VITALS — BP 132/79 | HR 66 | Temp 97.5°F | Resp 14 | Ht 72.0 in | Wt 231.0 lb

## 2022-05-11 DIAGNOSIS — K2270 Barrett's esophagus without dysplasia: Secondary | ICD-10-CM

## 2022-05-11 MED ORDER — SODIUM CHLORIDE 0.9 % IV SOLN: 500.0000 mL | Freq: Once | INTRAVENOUS | Status: DC

## 2022-05-11 NOTE — Progress Notes (Signed)
Called to room to assist during endoscopic procedure.  Patient ID and intended procedure confirmed with present staff. Received instructions for my participation in the procedure from the performing physician.  

## 2022-05-11 NOTE — Progress Notes (Signed)
Uneventful anesthetic. Report to pacu rn. Vss. Care resumed by rn. 

## 2022-05-11 NOTE — Progress Notes (Signed)
VS completed by DT.  Pt's states no medical or surgical changes since previsit or office visit.  

## 2022-05-11 NOTE — Op Note (Signed)
[facility] Patient Name: Zachary Kim Procedure Date: 05/11/2022 1:23 PM MRN: [id number] Endoscopist: Mallie Mussel L. Loletha Carrow , MD, [id number] Age: 72 Referring MD:  Date of Birth: 07-07-1950 Gender: Male Account #: [id number] Procedure:                Upper GI endoscopy Indications:              Surveillance for malignancy due to personal history                            of Barrett's esophagus Medicines:                Monitored Anesthesia Care Procedure:                Pre-Anesthesia Assessment:                           - Prior to the procedure, a History and Physical                            was performed, and patient medications and                            allergies were reviewed. The patient's tolerance of                            previous anesthesia was also reviewed. The risks                            and benefits of the procedure and the sedation                            options and risks were discussed with the patient.                            All questions were answered, and informed consent                            was obtained. Prior Anticoagulants: The patient has                            taken no anticoagulant or antiplatelet agents. ASA                            Grade Assessment: III - A patient with severe                            systemic disease. After reviewing the risks and                            benefits, the patient was deemed in satisfactory                            condition to undergo the procedure.  After obtaining informed consent, the endoscope was                            passed under direct vision. Throughout the                            procedure, the patient's blood pressure, pulse, and                            oxygen saturations were monitored continuously. The                            GIF [id number] was introduced through the                            mouth, and advanced to the  second part of duodenum.                            The upper GI endoscopy was accomplished without                            difficulty. The patient tolerated the procedure                            well. Scope In: Scope Out: Findings:                 The larynx was normal.                           There were esophageal mucosal changes secondary to                            established short-segment Barrett's disease present                            in the lower third of the esophagus. The maximum                            longitudinal extent of these mucosal changes was 3                            cm in length. Circumferential in the most distal 2                            cm, tongues and islands of salmon-colored mucosa in                            the proximal centimeter. Area biopsied with a cold                            forceps for histology in a targeted manner and in 4  quadrants at intervals of 1.5 cm from 38 to 41 cm                            from the incisors. A total of 3 specimen bottles                            were sent to pathology. The entire area was                            examined under white light, NBI and magnification                            with no raised or otherwise suspicious areas seen.                           The exam of the esophagus was otherwise normal.                           The stomach was normal.                           The cardia and gastric fundus were normal on                            retroflexion.                           A few small diverticuli was found in the second                            portion of the duodenum. Duodenum otherwise normal Complications:            No immediate complications. Estimated Blood Loss:     Estimated blood loss was minimal. Impression:               - Normal larynx.                           - Esophageal mucosal changes secondary to                             established short-segment Barrett's disease.                            Biopsied.                           - Normal stomach.                           - Duodenal diverticulum. Recommendation:           - Patient has a contact number available for                            emergencies. The signs and symptoms of potential  delayed complications were discussed with the                            patient. Return to normal activities tomorrow.                            Written discharge instructions were provided to the                            patient.                           - Resume previous diet.                           - Continue present medications.                           - Await pathology results.                           - Repeat upper endoscopy for surveillance based on                            pathology results. Latice Waitman L. Loletha Carrow, MD 05/11/2022 1:53:40 PM This report has been signed electronically.

## 2022-05-11 NOTE — Progress Notes (Signed)
No changes to clinical history since GI office visit on 04/19/32.  The patient is appropriate for an endoscopic procedure in the ambulatory setting.  - Wilfrid Lund, MD

## 2022-05-11 NOTE — Patient Instructions (Addendum)
     Await pathology results on biopsies done today    YOU HAD AN ENDOSCOPIC PROCEDURE TODAY AT [facility]:   Refer to the procedure report that was given to you for any specific questions about what was found during the examination.  If the procedure report does not answer your questions, please call your gastroenterologist to clarify.  If you requested that your care partner not be given the details of your procedure findings, then the procedure report has been included in a sealed envelope for you to review at your convenience later.  YOU SHOULD EXPECT: Some feelings of bloating in the abdomen. Passage of more gas than usual.  Walking can help get rid of the air that was put into your GI tract during the procedure and reduce the bloating. If you had a lower endoscopy (such as a colonoscopy or flexible sigmoidoscopy) you may notice spotting of blood in your stool or on the toilet paper. If you underwent a bowel prep for your procedure, you may not have a normal bowel movement for a few days.  Please Note:  You might notice some irritation and congestion in your nose or some drainage.  This is from the oxygen used during your procedure.  There is no need for concern and it should clear up in a day or so.  SYMPTOMS TO REPORT IMMEDIATELY:  Following upper endoscopy (EGD)  Vomiting of blood or coffee ground material  New chest pain or pain under the shoulder blades  Painful or persistently difficult swallowing  New shortness of breath  Fever of 100F or higher  Black, tarry-looking stools  For urgent or emergent issues, a gastroenterologist can be reached at any hour by calling [phone]. Do not use MyChart messaging for urgent concerns.    DIET:  We do recommend a small meal at first, but then you may proceed to your regular diet.  Drink plenty of fluids but you should avoid alcoholic beverages for 24 hours.  ACTIVITY:  You should plan to take it easy for the rest  of today and you should NOT DRIVE or use heavy machinery until tomorrow (because of the sedation medicines used during the test).    FOLLOW UP: Our staff will call the number listed on your records the next business day following your procedure.  We will call around 7:15- 8:00 am to check on you and address any questions or concerns that you may have regarding the information given to you following your procedure. If we do not reach you, we will leave a message.     If any biopsies were taken you will be contacted by phone or by letter within the next 1-3 weeks.  Please call us at [phone] if you have not heard about the biopsies in 3 weeks.    SIGNATURES/CONFIDENTIALITY: You and/or your care partner have signed paperwork which will be entered into your electronic medical record.  These signatures attest to the fact that that the information above on your After Visit Summary has been reviewed and is understood.  Full responsibility of the confidentiality of this discharge information lies with you and/or your care-partner.

## 2022-05-12 ENCOUNTER — Telehealth: Payer: Self-pay

## 2022-05-12 NOTE — Telephone Encounter (Signed)
  Follow up Call-     05/11/2022   12:48 PM  Call back number  Post procedure Call Back phone  # [phone]  Permission to leave phone message Yes     Patient questions:  Do you have a fever, pain , or abdominal swelling? No. Pain Score  0 *  Have you tolerated food without any problems? Yes.    Have you been able to return to your normal activities? Yes.    Do you have any questions about your discharge instructions: Diet   No. Medications  No. Follow up visit  No.  Do you have questions or concerns about your Care? No.  Actions: * If pain score is 4 or above: No action needed, pain <4.

## 2022-05-18 ENCOUNTER — Encounter: Payer: Self-pay | Admitting: Gastroenterology

## 2022-12-28 ENCOUNTER — Encounter (INDEPENDENT_AMBULATORY_CARE_PROVIDER_SITE_OTHER): Payer: Self-pay | Admitting: Otolaryngology

## 2023-01-05 ENCOUNTER — Other Ambulatory Visit (INDEPENDENT_AMBULATORY_CARE_PROVIDER_SITE_OTHER): Payer: Medicare Other

## 2023-01-05 ENCOUNTER — Encounter: Payer: Self-pay | Admitting: Orthopaedic Surgery

## 2023-01-05 ENCOUNTER — Ambulatory Visit (INDEPENDENT_AMBULATORY_CARE_PROVIDER_SITE_OTHER): Payer: Medicare Other | Admitting: Orthopaedic Surgery

## 2023-01-05 VITALS — BP 137/70 | HR 79 | Ht 72.0 in | Wt 230.0 lb

## 2023-01-05 DIAGNOSIS — M545 Low back pain, unspecified: Secondary | ICD-10-CM

## 2023-01-05 DIAGNOSIS — G8929 Other chronic pain: Secondary | ICD-10-CM

## 2023-01-05 NOTE — Progress Notes (Signed)
Office Visit Note   Patient: Zachary Kim           Date of Birth: 12-03-1950           MRN: [id number] Visit Date: 01/05/2023              Requested by: Ellis Parents, FNP [address] [place],  [place] [address] PCP: Marin Comment, FNP   Assessment & Plan: Visit Diagnoses:  1. Chronic right-sided low back pain, unspecified whether sciatica present     Plan: Will set patient up for some therapy for treatment of his back pain and radicular symptoms.  Plain radiographs show narrowing at L4-5 no definite laminotomy defect.  Recheck 4 weeks.  Exam persistent symptoms and failed therapy we will proceed with MRI imaging with and without contrast from his previous surgery.  He has had problems with hyperglycemia I do not have any recent labs since labs are all obtained in [place] but will defer prednisone Dosepak with his hyperglycemia history  Follow-Up Instructions: Return in about 4 weeks (around 02/02/2023).   Orders:  Orders Placed This Encounter  Procedures   XR Lumbar Spine 2-3 Views   Ambulatory referral to Physical Therapy   No orders of the defined types were placed in this encounter.     Procedures: No procedures performed   Clinical Data: No additional findings.   Subjective: Chief Complaint  Patient presents with   Lower Back - Pain    HPI 72 year old male with chronic low back pain recent exacerbation increased back pain and right leg pain.  He states his leg is numb down to his toes.  Back surgery more than 20 years ago he thinks it was a long hospital.  He has had injections in his back many years ago which gave him some relief.  He is use over-the-counter creams difficulty turning twisting and walking.  He states the right leg feels like it is going to give way and he has problems with stairs.  Patient is hard of hearing and states that someone called him call his wife.  Past problems with colitis he had portion of his colon excised he is not exactly  sure of his diagnosis but apparently had a cecal mass.  Previous gallbladder surgery.  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  Review of Systems all other systems noncontributory to HPI.   Objective: Vital Signs: BP 137/70   Pulse 79   Ht 6' (1.829 m)   Wt 230 lb (104.3 kg)   BMI 31.19 kg/m   Physical Exam Constitutional:      Appearance: He is well-developed.  HENT:     Head: Normocephalic and atraumatic.     Right Ear: External ear normal.     Left Ear: External ear normal.  Eyes:     Pupils: Pupils are equal, round, and reactive to light.  Neck:     Thyroid: No thyromegaly.     Trachea: No tracheal deviation.  Cardiovascular:     Rate and Rhythm: Normal rate.  Pulmonary:     Effort: Pulmonary effort is normal.     Breath sounds: No wheezing.  Abdominal:     General: Bowel sounds are normal.     Palpations: Abdomen is soft.  Musculoskeletal:     Cervical back: Neck supple.  Skin:    General: Skin is warm and dry.     Capillary Refill: Capillary refill takes less than 2 seconds.  Neurological:  Mental Status: He is alert and oriented to person, place, and time.  Psychiatric:        Behavior: Behavior normal.        Thought Content: Thought content normal.        Judgment: Judgment normal.     Ortho Exam patient is amatory with right leg limp.  He is able to heel and toe walk with some balance problems.  Positive straight leg raise on the right positive up to compression test on the right negative on the left.  Tenderness in the right well-healed midline incision at L4-5 level.  Specialty Comments:  No specialty comments available.  Imaging: XR Lumbar Spine 2-3 Views  Result Date: 01/05/2023 AP lateral lumbar images are obtained and reviewed.  There is narrowing L4-5 interspace unchanged from 2012 radiographs.  Endplate spurring noted and facet arthropathy. 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   S/P partial colectomy 08/31/2013   Fever 08/31/2013   Normocytic anemia 08/31/2013   Hyperglycemia 08/31/2013   Hyponatremia 08/31/2013   Postoperative ileus (HCC) 08/23/2013   Cecum mass 07/20/2013   Mass of appendix 06/29/2013   Post-operative state 05/14/2013   S/P cholecystectomy March [id number] 04/29/2013   Past Medical History:  Diagnosis Date   Arthritis    Asthma    if have cold   Back pain    Gallstones    GERD (gastroesophageal reflux disease)    HOH (hard of hearing)    no hearing aids   Hyperlipidemia    Hypertension    Hypokalemia    Pneumonia    hx    Family History  Problem Relation Age of Onset   Diabetes Mother    Heart disease Father        had valve replacement   Breast cancer Sister    Colon cancer Neg Hx     Past Surgical History:  Procedure Laterality Date   ABDOMINAL SURGERY     APPENDECTOMY     BACK SURGERY     lower slipped disc   BACK SURGERY     CHOLECYSTECTOMY N/A 04/24/2013   Procedure: LAPAROSCOPIC CHOLECYSTECTOMY WITH INTRAOPERATIVE CHOLANGIOGRAM;  Surgeon: Clovis Pu. Cornett, MD;  Location: [facility] OR;  Service: General;  Laterality: N/A;  attempted   CHOLECYSTECTOMY  04/24/2013   Procedure: CHOLECYSTECTOMY;  Surgeon: Clovis Pu. Cornett, MD;  Location: [facility] OR;  Service: General;;   CHOLECYSTECTOMY     COLONOSCOPY     LAPAROSCOPIC PARTIAL COLECTOMY N/A 08/16/2013   Procedure: ATTEMPTED LAPAROSCOPIC PARTIAL COLECTOMY CONVERTED TO OPEN;  Surgeon: Maisie Fus A. Cornett, MD;  Location: [facility];  Service: General;  Laterality: N/A;   LYSIS OF ADHESION N/A 04/24/2013   Procedure: LYSIS OF ADHESION;  Surgeon: Clovis Pu. Cornett, MD;  Location: [facility];   Service: General;  Laterality: N/A;   PARTIAL COLECTOMY  08/16/2013   OPEN      BY CORNETT   PERIPHERALLY INSERTED CENTRAL CATHETER INSERTION Right 09/27/13   SMALL INTESTINE SURGERY     intestine blockage   Social History   Occupational History   Occupation: self employed  Tobacco Use   Smoking status: Former  Current packs/day: 0.00    Average packs/day: 2.0 packs/day for 12.0 years (24.0 ttl pk-yrs)    Types: Cigarettes    Start date: 04/10/1971    Quit date: 04/10/1983    Years since quitting: 39.7   Smokeless tobacco: Current    Types: Chew   Tobacco comments:    chew tobacco, tobacco info given 06/29/13  Vaping Use   Vaping status: Never Used  Substance and Sexual Activity   Alcohol use: Yes    Comment: about 70-80 can a week   Drug use: Never   Sexual activity: Not on file

## 2023-01-24 ENCOUNTER — Telehealth (INDEPENDENT_AMBULATORY_CARE_PROVIDER_SITE_OTHER): Payer: Self-pay | Admitting: Otolaryngology

## 2023-01-24 NOTE — Therapy (Signed)
OUTPATIENT PHYSICAL THERAPY EVALUATION   Patient Name: Zachary Kim MRN: [id number] DOB:01-09-1951, 72 y.o., male Today's Date: 01/24/2023  END OF SESSION:   Past Medical History:  Diagnosis Date   Arthritis    Asthma    if have cold   Back pain    Gallstones    GERD (gastroesophageal reflux disease)    HOH (hard of hearing)    no hearing aids   Hyperlipidemia    Hypertension    Hypokalemia    Pneumonia    hx   Past Surgical History:  Procedure Laterality Date   ABDOMINAL SURGERY     APPENDECTOMY     BACK SURGERY     lower slipped disc   BACK SURGERY     CHOLECYSTECTOMY N/A 04/24/2013   Procedure: LAPAROSCOPIC CHOLECYSTECTOMY WITH INTRAOPERATIVE CHOLANGIOGRAM;  Surgeon: Clovis Pu. Cornett, MD;  Location: [facility];  Service: General;  Laterality: N/A;  attempted   CHOLECYSTECTOMY  04/24/2013   Procedure: CHOLECYSTECTOMY;  Surgeon: Clovis Pu. Cornett, MD;  Location: [facility];  Service: General;;   CHOLECYSTECTOMY     COLONOSCOPY     LAPAROSCOPIC PARTIAL COLECTOMY N/A 08/16/2013   Procedure: ATTEMPTED LAPAROSCOPIC PARTIAL COLECTOMY CONVERTED TO OPEN;  Surgeon: Maisie Fus A. Cornett, MD;  Location: [facility];  Service: General;  Laterality: N/A;   LYSIS OF ADHESION N/A 04/24/2013   Procedure: LYSIS OF ADHESION;  Surgeon: Clovis Pu. 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  ONSET DATE: ***  SUBJECTIVE:  SUBJECTIVE STATEMENT: ***  PERTINENT HISTORY:  ***  PAIN:  NPRS scale: ***/10 Pain location: *** Pain description: *** Aggravating factors: *** Relieving factors: ***  PRECAUTIONS: None  WEIGHT BEARING RESTRICTIONS: No  FALLS:  Has patient fallen in last 6 months? No  LIVING ENVIRONMENT: Lives with: {OPRC lives with:25569::"lives with their family"} Lives in: {Lives in:25570} Stairs: {opstairs:27293} Has following equipment at home: {Assistive devices:23999}  OCCUPATION: ***  PLOF: Independent  PATIENT GOALS: ***  Next MD Visit:    OBJECTIVE:   PATIENT SURVEYS:  01/25/2023 FOTO eval:    predicted:    SCREENING FOR RED FLAGS: 01/25/2023 Bowel or bladder incontinence: {GNF/AO:130865784} Cauda equina syndrome: {ONG/EX:528413244}  COGNITION: 01/25/2023 Overall cognitive status: WFL normal      SENSATION: 01/25/2023 {sensation:27233}  MUSCLE LENGTH: 01/25/2023 Hamstrings: Right *** deg; Left *** deg Maisie Fus test: Right *** deg; Left *** deg  POSTURE:  01/25/2023 {posture:25561}  PALPATION: 01/25/2023 ***  LUMBAR ROM:  01/25/2023  Directional Preference Assessment: Centralization: Peripheralization:   AROM 01/25/2023  Flexion   Extension    Right lateral flexion   Left lateral flexion   Right rotation   Left rotation    (Blank rows = not tested)  LOWER EXTREMITY ROM:      Right 01/25/2023 Left 01/25/2023  Hip flexion    Hip extension    Hip abduction    Hip adduction    Hip internal rotation    Hip external rotation    Knee flexion    Knee extension    Ankle dorsiflexion    Ankle plantarflexion    Ankle inversion    Ankle eversion     (Blank rows = not tested)  LOWER EXTREMITY MMT:    MMT Right 01/25/2023 Left 01/25/2023  Hip flexion    Hip extension    Hip abduction    Hip adduction    Hip internal rotation    Hip external rotation    Knee flexion    Knee extension    Ankle dorsiflexion    Ankle plantarflexion    Ankle inversion    Ankle eversion     (Blank rows = not tested)  LUMBAR SPECIAL TESTS:  01/25/2023 {lumbar special test:25242}  FUNCTIONAL TESTS:  01/25/2023 {Functional tests:24029}  GAIT: 01/25/2023                                                                                                                                                                                                                   TODAY'S  TREATMENT:                                                                                                         DATE: 01/25/2023  Therex:    HEP instruction/performance c cues for techniques, handout provided.  Trial set performed of each for comprehension and symptom assessment.  See below for exercise list  PATIENT EDUCATION:  01/25/2023 Education details: HEP, POC Person educated: Patient Education method: Programmer, multimedia, Demonstration, Verbal cues, and Handouts Education comprehension: verbalized understanding, returned demonstration, and verbal cues required  HOME EXERCISE PROGRAM: ***  ASSESSMENT:  CLINICAL IMPRESSION: Patient is a 72 y.o. who comes to clinic with complaints of back pain with mobility, strength and movement coordination deficits that  impair their ability to perform usual daily and recreational functional activities without increase difficulty/symptoms at this time.  Patient to benefit from skilled PT services to address impairments and limitations to improve to previous level of function without restriction secondary to condition.   OBJECTIVE IMPAIRMENTS: {opptimpairments:25111}.   ACTIVITY LIMITATIONS: {activitylimitations:27494}  PARTICIPATION LIMITATIONS: {participationrestrictions:25113}  PERSONAL FACTORS: {Personal factors:25162} are also affecting patient's functional outcome.   REHAB POTENTIAL: {rehabpotential:25112}  CLINICAL DECISION MAKING: {clinical decision making:25114}  EVALUATION COMPLEXITY: {Evaluation complexity:25115}   GOALS: Goals reviewed with patient? Yes  SHORT TERM GOALS: (target date for Short term goals are 3 weeks ***)  1. Patient will demonstrate independent use of home exercise program to maintain progress from in clinic treatments.  Goal status: New  LONG TERM GOALS: (target dates for all long term goals are 10 weeks  *** )   1. Patient will demonstrate/report pain at worst less than or equal to 2/10 to facilitate minimal limitation in daily activity secondary to pain symptoms.  Goal status: New   2. Patient will demonstrate independent use of home exercise program to facilitate ability to maintain/progress functional gains from skilled physical therapy services.  Goal status: New   3. Patient will demonstrate FOTO outcome > or = *** % to indicate reduced disability due to condition.  Goal status: New   4. Patient will demonstrate lumbar extension 100 % WFL s symptoms to facilitate upright standing, walking posture at PLOF s limitation.  Goal status: New   5.  ***  Goal status: New   6.  *** Goal status: New   7.  *** Goal Status: New  PLAN:  PT FREQUENCY: 1-2x/week  PT DURATION: 10 weeks  PLANNED INTERVENTIONS: Can include 48546- PT Re-evaluation,  97110-Therapeutic exercises, 97530- Therapeutic activity, [id number]- Neuromuscular re-education, 97535- Self Care, 97140- Manual therapy, [id number]- Gait training, [phone]- Orthotic Fit/training, [id number]- Canalith repositioning, [id number]- Aquatic Therapy, 97014- Electrical stimulation (unattended), [id number]- Electrical stimulation (manual), [id number]- Vasopneumatic device, [id number]- Ultrasound, [id number]- Traction (mechanical), [id number]- Ionotophoresis 4mg /ml Dexamethasone, Patient/Family education, Balance training, Stair training, Taping, Dry Needling, Joint mobilization, Joint manipulation, Spinal manipulation, Spinal mobilization, Scar mobilization, Vestibular training, Visual/preceptual remediation/compensation, DME instructions, Cryotherapy, and Moist heat.  All performed as medically necessary.  All included unless contraindicated  PLAN FOR NEXT SESSION: Review HEP knowledge/results.   Chyrel Masson, PT, DPT, OCS,  ATC 01/24/23  3:31 PM

## 2023-01-24 NOTE — Telephone Encounter (Signed)
Called to remind patient of appt, they thought they had canceled. Rescheduled for Jan.

## 2023-01-25 ENCOUNTER — Ambulatory Visit (INDEPENDENT_AMBULATORY_CARE_PROVIDER_SITE_OTHER): Payer: Medicare Other | Admitting: Rehabilitative and Restorative Service Providers"

## 2023-01-25 ENCOUNTER — Ambulatory Visit (INDEPENDENT_AMBULATORY_CARE_PROVIDER_SITE_OTHER): Payer: Medicare Other | Admitting: Audiology

## 2023-01-25 ENCOUNTER — Encounter: Payer: Self-pay | Admitting: Rehabilitative and Restorative Service Providers"

## 2023-01-25 ENCOUNTER — Institutional Professional Consult (permissible substitution) (INDEPENDENT_AMBULATORY_CARE_PROVIDER_SITE_OTHER): Payer: Medicare Other

## 2023-01-25 DIAGNOSIS — R293 Abnormal posture: Secondary | ICD-10-CM

## 2023-01-25 DIAGNOSIS — M5459 Other low back pain: Secondary | ICD-10-CM | POA: Diagnosis not present

## 2023-01-28 ENCOUNTER — Ambulatory Visit (INDEPENDENT_AMBULATORY_CARE_PROVIDER_SITE_OTHER): Payer: Medicare Other | Admitting: Physical Therapy

## 2023-01-28 ENCOUNTER — Encounter: Payer: Self-pay | Admitting: Physical Therapy

## 2023-01-28 DIAGNOSIS — R293 Abnormal posture: Secondary | ICD-10-CM | POA: Diagnosis not present

## 2023-01-28 DIAGNOSIS — M5459 Other low back pain: Secondary | ICD-10-CM

## 2023-01-28 NOTE — Therapy (Signed)
OUTPATIENT PHYSICAL THERAPY TREATMENT    Patient Name: Zachary Kim MRN: [id number] DOB:02/03/1951, 72 y.o., male Today's Date: 01/28/2023  END OF SESSION:  PT End of Session - 01/28/23 1110     Visit Number 2    Number of Visits 20    Date for PT Re-Evaluation 04/05/23    Authorization Type Medicare/ [employer]    Progress Note Due on Visit 10    PT Start Time 1102    PT Stop Time 1143    PT Time Calculation (min) 41 min    Activity Tolerance Patient tolerated treatment well    Behavior During Therapy WFL for tasks assessed/performed              Past Medical History:  Diagnosis Date   Arthritis    Asthma    if have cold   Back pain    Gallstones    GERD (gastroesophageal reflux disease)    HOH (hard of hearing)    no hearing aids   Hyperlipidemia    Hypertension    Hypokalemia    Pneumonia    hx   Past Surgical History:  Procedure Laterality Date   ABDOMINAL SURGERY     APPENDECTOMY     BACK SURGERY     lower slipped disc   BACK SURGERY     CHOLECYSTECTOMY N/A 04/24/2013   Procedure: LAPAROSCOPIC CHOLECYSTECTOMY WITH INTRAOPERATIVE CHOLANGIOGRAM;  Surgeon: Clovis Pu. Cornett, MD;  Location: [facility];  Service: General;  Laterality: N/A;  attempted   CHOLECYSTECTOMY  04/24/2013   Procedure: CHOLECYSTECTOMY;  Surgeon: Clovis Pu. Cornett, MD;  Location: [facility];  Service: General;;   CHOLECYSTECTOMY     COLONOSCOPY     LAPAROSCOPIC PARTIAL COLECTOMY N/A 08/16/2013   Procedure: ATTEMPTED LAPAROSCOPIC PARTIAL COLECTOMY CONVERTED TO OPEN;  Surgeon: Maisie Fus A. Cornett, MD;  Location: [facility];  Service: General;  Laterality: N/A;   LYSIS OF ADHESION N/A 04/24/2013   Procedure: LYSIS OF ADHESION;  Surgeon: Clovis Pu. Cornett, MD;  Location: [facility];  Service: General;  Laterality: N/A;   PARTIAL COLECTOMY  08/16/2013   OPEN      BY CORNETT   PERIPHERALLY INSERTED CENTRAL CATHETER INSERTION Right 09/27/13   SMALL INTESTINE SURGERY     intestine blockage   Patient Active Problem  List   Diagnosis Date Noted   Loss of weight 09/28/2013   FUO (fever of unknown origin) 09/28/2013   Diarrhea 09/14/2013   Anorexia 09/14/2013   Hypokalemia 09/14/2013   Thrombocytopenia (HCC) 09/14/2013   Failure to thrive in adult 09/13/2013   Tubulovillous adenoma of colon 08/31/2013   S/P partial colectomy 08/31/2013   Fever 08/31/2013   Normocytic anemia 08/31/2013   Hyperglycemia 08/31/2013   Hyponatremia 08/31/2013   Postoperative ileus (HCC) 08/23/2013   Cecum mass 07/20/2013   Mass of appendix 06/29/2013   Post-operative state 05/14/2013   S/P cholecystectomy March 53664 04/29/2013    PCP: Marin Comment, FNP  REFERRING PROVIDER: Eldred Manges, MD  REFERRING DIAG: M54.50,G89.29 (ICD-10-CM) - Chronic right-sided low back pain, unspecified whether sciatica present  Rationale for Evaluation and Treatment: Rehabilitation  THERAPY DIAG:  Other low back pain  Abnormal posture  ONSET DATE: Chronic "years"  SUBJECTIVE:  SUBJECTIVE STATEMENT:   Nothing new since last time. Trying to do HEP, not sure if they're helping or not.  R LE is numb but that's been a constant for a long time, just always there    EVAL: Pt indicated "pain for years."  Pt indicated feeling pain in back and sometimes in Rt leg shooting quickly about every day.  Pt. Indicated various random movements in day cause pain.  Pt indicated Rt leg numbness prior to surgery, improved with surgery but then returned.  Pt indicated numbness on Rt lower leg/foot.  Pt. Indicated unable to sleep in bed due to symptoms.   PERTINENT HISTORY:  Low back pain, previous right L4-5 microdiscectomy 20 years ago  PAIN:  NPRS scale: current back: 0/10 "no pain RLE is just numb like it always is"  Pain location: R LE  Pain description:  numbness  Aggravating factors:  n/a  Relieving factors:  n/a   PRECAUTIONS: None  WEIGHT BEARING RESTRICTIONS: No  FALLS:  Has patient fallen in last 6 months? No  LIVING ENVIRONMENT: Lives with: lives with their family Lives in: House/apartment Stairs: has attic/basement but no stairs to bedroom.  Has to step to instead of reciprocal.   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   PLOF: Independent, travel to beach/vacation.  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   PATIENT GOALS: Reduce pain  OBJECTIVE:   PATIENT SURVEYS:  01/25/2023 FOTO eval:   36 predicted:  50  SCREENING FOR RED FLAGS: 01/25/2023 Bowel or bladder incontinence: No Cauda equina syndrome: No  COGNITION: 01/25/2023 Overall cognitive status: WFL normal      SENSATION: 01/25/2023 Not tested  MUSCLE LENGTH: 01/25/2023 No specific testing.   POSTURE:  01/25/2023 rounded shoulders, forward head, decreased lumbar lordosis, and flexed trunk   PALPATION: 01/25/2023 No specific tenderness noted in back  LUMBAR ROM:  01/25/2023  Directional Preference Assessment: Centralization: not present Peripheralization: no present  AROM 01/25/2023  Flexion To knees with back pain noted  Extension 50% WFL s symptoms  Repeated in standing x 5: Improved to 75%  Right lateral flexion   Left lateral flexion   Right rotation   Left rotation    (Blank rows = not tested)  LOWER EXTREMITY ROM:      Right 01/25/2023 Left 01/25/2023  Hip flexion    Hip extension    Hip abduction    Hip adduction    Hip internal rotation    Hip external rotation    Knee flexion    Knee extension    Ankle dorsiflexion    Ankle plantarflexion    Ankle inversion    Ankle eversion     (Blank rows = not tested)  LOWER EXTREMITY MMT:    MMT Right 01/25/2023 Left 01/25/2023  Hip flexion 5/5 5/5  Hip extension    Hip abduction    Hip adduction    Hip internal rotation    Hip external  rotation    Knee flexion 5/5 5/5  Knee extension 5/5 5/5  Ankle dorsiflexion 5/5 5/5  Ankle plantarflexion    Ankle inversion    Ankle eversion     (Blank rows = not tested)  LUMBAR SPECIAL TESTS:  01/25/2023 (+) Slump bilateral    FUNCTIONAL TESTS:  01/25/2023 18 inch chair transfer: able on 1st try s UE assist.   GAIT: 01/25/2023 Forward trunk lean in walking.  TODAY'S TREATMENT:                                                                                                         DATE:   01/28/23  TherEx  Nustep L5 x6 minutes BLEs only for w/u and tissue perfusion  SKTC 5x5 seconds B  Lumbar rotation stretch 5x5 seconds B  Supine figure 4 stretch 2x30 seconds B  HS stretches 2x30 seconds B  Bridges x12 Supine TA sets 15x3 seconds Hip flexor stretch 1x60 seconds B supine off edge of table   Education on how holding breath/straining can overall increase mm tension and prime nervous system to be more sensitive to pain as a whole     01/25/2023   Therex:    HEP instruction/performance c cues for techniques, handout provided.  Trial set performed of each for comprehension and symptom assessment.  See below for exercise list  PATIENT EDUCATION:  01/25/2023 Education details: HEP, POC Person educated: Patient Education method: Explanation, Demonstration, Verbal cues, and Handouts Education comprehension: verbalized understanding, returned demonstration, and verbal cues required  HOME EXERCISE PROGRAM: Access Code: [email or web address] URL: [email or web address] Date: 01/25/2023 Prepared by: Chyrel Masson  Exercises - Supine Lower Trunk Rotation  - 1-2 x daily - 7 x weekly - 1 sets - 3-5 reps - 15 hold - Hooklying Single Knee to Chest Stretch  - 1-2 x  daily - 7 x weekly - 1 sets - 5 reps - 15 hold - Supine 90/90 Sciatic Nerve Glide with Knee Flexion/Extension  - 1-2 x daily - 7 x weekly - 1-2 sets - 10 reps - Standing Lumbar Extension with Counter  - 3-5 x daily - 7 x weekly - 1 sets - 5-10 reps  ASSESSMENT:  CLINICAL IMPRESSION:   Pt arrives today doing OK, has been compliant with HEP but not noticing too many changes thus far. Focused on lumbar and hip mobility as well as core/proximal strengthening this morning as per POC. No adverse effects from interventions this session, will continue efforts. Needed a lot of heavy VC even for basic exercises today (poor general proprioception noted) as well as not straining/holding breath with exercises.      EVAL: Patient is a 72 y.o. who comes to clinic with complaints of back pain with mobility deficits that impair their ability to perform usual daily and recreational functional activities without increase difficulty/symptoms at this time.  Patient to benefit from skilled PT services to address impairments and limitations to improve to previous level of function without restriction secondary to condition.   OBJECTIVE IMPAIRMENTS: Abnormal gait, decreased activity tolerance, decreased coordination, decreased endurance, decreased mobility, difficulty walking, decreased ROM, hypomobility, increased fascial restrictions, impaired perceived functional ability, impaired flexibility, improper body mechanics, postural dysfunction, and pain.   ACTIVITY LIMITATIONS: carrying, lifting, bending, sitting, standing, squatting, sleeping, stairs, transfers, bed mobility, and locomotion level  PARTICIPATION LIMITATIONS: meal prep, cleaning, laundry, interpersonal relationship, shopping, community activity, occupation, and yard work  PERSONAL FACTORS:  Chronic history of back pain, GERD, hyperlipidemia, HTN, arthritis  are also affecting patient's functional  outcome.   REHAB POTENTIAL: Fair to good  CLINICAL  DECISION MAKING: Stable/uncomplicated  EVALUATION COMPLEXITY: Low   GOALS: Goals reviewed with patient? Yes  SHORT TERM GOALS: (target date for Short term goals are 3 weeks 02/15/2023)  1. Patient will demonstrate independent use of home exercise program to maintain progress from in clinic treatments.  Goal status: New  LONG TERM GOALS: (target dates for all long term goals are 10 weeks  04/05/2023 )   1. Patient will demonstrate/report pain at worst less than or equal to 2/10 to facilitate minimal limitation in daily activity secondary to pain symptoms.  Goal status: New   2. Patient will demonstrate independent use of home exercise program to facilitate ability to maintain/progress functional gains from skilled physical therapy services.  Goal status: New   3. Patient will demonstrate FOTO outcome > or = 50 % to indicate reduced disability due to condition.  Goal status: New   4. Patient will demonstrate lumbar extension 100 % WFL s symptoms to facilitate upright standing, walking posture at PLOF s limitation.  Goal status: New   5.  Patient will demonstrate/report ability to sleep in bed for return to normal sleeping positioning.   Goal status: New   6.  Patient will demonstrate/report ability to perform walking, standing, sitting > 30 mins s limitation due to back pain.  Goal status: New   PLAN:  PT FREQUENCY: 1-2x/week  PT DURATION: 10 weeks  PLANNED INTERVENTIONS: Can include 40981- PT Re-evaluation, 97110-Therapeutic exercises, 97530- Therapeutic activity, [id number]- Neuromuscular re-education, 97535- Self Care, 97140- Manual therapy, [phone]- Gait training, [phone]- Orthotic Fit/training, [phone]- Canalith repositioning, [id number]- Aquatic Therapy, 97014- Electrical stimulation (unattended), [id number]- Electrical stimulation (manual), [id number]- Vasopneumatic device, [id number]- Ultrasound, [id number]- Traction (mechanical), [id number]- Ionotophoresis 4mg /ml Dexamethasone, Patient/Family education,  Balance training, Stair training, Taping, Dry Needling, Joint mobilization, Joint manipulation, Spinal manipulation, Spinal mobilization, Scar mobilization, Vestibular training, Visual/preceptual remediation/compensation, DME instructions, Cryotherapy, and Moist heat.  All performed as medically necessary.  All included unless contraindicated  PLAN FOR NEXT SESSION: Review HEP knowledge/results.  Possible benefit from manual for lumbar mobility gains. [facility]    Nedra Hai, PT, DPT 01/28/23 11:43 AM

## 2023-02-01 ENCOUNTER — Encounter: Payer: Self-pay | Admitting: Physical Therapy

## 2023-02-01 ENCOUNTER — Ambulatory Visit (INDEPENDENT_AMBULATORY_CARE_PROVIDER_SITE_OTHER): Payer: Medicare Other | Admitting: Orthopaedic Surgery

## 2023-02-01 ENCOUNTER — Ambulatory Visit (INDEPENDENT_AMBULATORY_CARE_PROVIDER_SITE_OTHER): Payer: Medicare Other | Admitting: Physical Therapy

## 2023-02-01 ENCOUNTER — Ambulatory Visit: Payer: Medicare Other | Admitting: Orthopaedic Surgery

## 2023-02-01 DIAGNOSIS — G8929 Other chronic pain: Secondary | ICD-10-CM

## 2023-02-01 DIAGNOSIS — M5416 Radiculopathy, lumbar region: Secondary | ICD-10-CM | POA: Insufficient documentation

## 2023-02-01 DIAGNOSIS — M5459 Other low back pain: Secondary | ICD-10-CM | POA: Diagnosis not present

## 2023-02-01 DIAGNOSIS — M545 Low back pain, unspecified: Secondary | ICD-10-CM | POA: Diagnosis not present

## 2023-02-01 DIAGNOSIS — R293 Abnormal posture: Secondary | ICD-10-CM

## 2023-02-01 NOTE — Progress Notes (Signed)
Office Visit Note   Patient: Zachary Kim           Date of Birth: Aug 16, 1950           MRN: [id number] Visit Date: 02/01/2023              Requested by: Marin Comment, FNP [address] [place],  [place] [address] PCP: Marin Comment, FNP   Assessment & Plan: Visit Diagnoses:  1. Chronic right-sided low back pain, unspecified whether sciatica present   2. Lumbar back pain with radiculopathy affecting right lower extremity     Plan: Will proceed with MRI with and without contrast for radiculopathy symptoms right side with previous surgery 25 years ago failed physical therapy, anti-inflammatories, Tylenol, topical cream and activity modification.  Office follow-up after MRI for review.  Follow-Up Instructions: No follow-ups on file.   Orders:  Orders Placed This Encounter  Procedures   MR Lumbar Spine W Wo Contrast   No orders of the defined types were placed in this encounter.     Procedures: No procedures performed   Clinical Data: No additional findings.   Subjective: Chief Complaint  Patient presents with   Lower Back - Pain, Follow-up    HPI 72 year old male returns with ongoing problems with back pain right leg pain with right leg numbness and weakness.  He had back surgery more than 20 years ago at [facility].  He did well for 15+ years.  Several months history of back pain right leg pain worse with activity standing and weakness problems with stairs.  He feels like his leg will give way primarily in the calf at times.  Recent partial colon removal for a tubulovillous adenoma of the colon nonmalignant.  Patient is been going to therapy since last seen in over a month therapy continues to have pain and states that therapy has not helped.  Patient's had anti-inflammatories been on Tylenol use topicals without relief.  Not taking narcotics.  Occasionally has had some left leg symptoms but it tends to stop at the knee.  Back pain right buttocks pain right  posterolateral thigh pain lateral calf pain radiates down to his foot most the time to the dorsum.  Review of Systems all systems noncontributory to HPI no chills or fever no associated bowel or bladder symptoms.   Objective: Vital Signs: There were no vitals taken for this visit.  Physical Exam Constitutional:      Appearance: He is well-developed.  HENT:     Head: Normocephalic and atraumatic.     Right Ear: External ear normal.     Left Ear: External ear normal.  Eyes:     Pupils: Pupils are equal, round, and reactive to light.  Neck:     Thyroid: No thyromegaly.     Trachea: No tracheal deviation.  Cardiovascular:     Rate and Rhythm: Normal rate.  Pulmonary:     Effort: Pulmonary effort is normal.     Breath sounds: No wheezing.  Abdominal:     General: Bowel sounds are normal.     Palpations: Abdomen is soft.  Musculoskeletal:     Cervical back: Neck supple.  Skin:    General: Skin is warm and dry.     Capillary Refill: Capillary refill takes less than 2 seconds.  Neurological:     Mental Status: He is alert and oriented to person, place, and time.  Psychiatric:        Behavior: Behavior normal.  Thought Content: Thought content normal.        Judgment: Judgment normal.     Ortho Exam well-healed incision L5-S1 mostly a few millimeters to the right of midline.  Sciatic notch tenderness on the right negative on the left pain with straight leg raising 80 degrees on the right.  Trace ankle jerk 2+ knee jerk right and left left ankle jerk is 2+.  Positive, popliteal compression test right side only.  Again some problems with heel toe walking with balance with right leg weaker.  Specialty Comments:  No specialty comments available.  Imaging: No results found.   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   S/P partial colectomy 08/31/2013   Fever 08/31/2013   Normocytic anemia 08/31/2013   Hyperglycemia 08/31/2013   Hyponatremia 08/31/2013   Postoperative ileus (HCC) 08/23/2013   Cecum mass 07/20/2013   Mass of appendix 06/29/2013   Post-operative state 05/14/2013   S/P cholecystectomy March 16109 04/29/2013   Past Medical History:  Diagnosis Date   Arthritis    Asthma    if have cold   Back pain    Gallstones    GERD (gastroesophageal reflux disease)    HOH (hard of hearing)    no hearing aids   Hyperlipidemia    Hypertension    Hypokalemia    Pneumonia    hx    Family History  Problem Relation Age of Onset   Diabetes Mother    Heart disease Father        had valve replacement   Breast cancer Sister    Colon cancer Neg Hx     Past Surgical History:  Procedure Laterality Date   ABDOMINAL SURGERY     APPENDECTOMY     BACK SURGERY     lower slipped disc   BACK SURGERY     CHOLECYSTECTOMY N/A 04/24/2013   Procedure: LAPAROSCOPIC CHOLECYSTECTOMY WITH INTRAOPERATIVE CHOLANGIOGRAM;  Surgeon: Clovis Pu. Cornett, MD;  Location: [facility];  Service: General;  Laterality: N/A;  attempted   CHOLECYSTECTOMY  04/24/2013   Procedure: CHOLECYSTECTOMY;  Surgeon: Clovis Pu. Cornett, MD;  Location: [facility];  Service: General;;   CHOLECYSTECTOMY     COLONOSCOPY     LAPAROSCOPIC PARTIAL COLECTOMY N/A 08/16/2013   Procedure: ATTEMPTED LAPAROSCOPIC PARTIAL COLECTOMY CONVERTED TO OPEN;  Surgeon: Maisie Fus A. Cornett, MD;  Location: [facility];  Service: General;  Laterality: N/A;   LYSIS OF ADHESION N/A 04/24/2013   Procedure: LYSIS OF ADHESION;  Surgeon: Clovis Pu. Cornett, MD;  Location: [facility];  Service: General;  Laterality: N/A;   PARTIAL COLECTOMY  08/16/2013   OPEN      BY CORNETT    PERIPHERALLY INSERTED CENTRAL CATHETER INSERTION Right 09/27/13   SMALL INTESTINE SURGERY     intestine blockage   Social History   Occupational History   Occupation: self employed  Tobacco Use   Smoking status: Former    Current packs/day: 0.00    Average packs/day: 2.0 packs/day for 12.0 years (24.0 ttl pk-yrs)    Types: Cigarettes    Start date: 04/10/1971    Quit date: 04/10/1983  Years since quitting: 39.8   Smokeless tobacco: Current    Types: Chew   Tobacco comments:    chew tobacco, tobacco info given 06/29/13  Vaping Use   Vaping status: Never Used  Substance and Sexual Activity   Alcohol use: Yes    Comment: about 70-80 can a week   Drug use: Never   Sexual activity: Not on file

## 2023-02-01 NOTE — Therapy (Signed)
OUTPATIENT PHYSICAL THERAPY TREATMENT    Patient Name: Zachary Kim MRN: [id number] DOB:[birth year], 72 y.o., male Today's Date: 02/01/2023  END OF SESSION:  PT End of Session - 02/01/23 1018     Visit Number 3    Number of Visits 20    Date for PT Re-Evaluation 04/05/23    Authorization Type Medicare/ [employer]    Progress Note Due on Visit 10    PT Start Time 1019    PT Stop Time 1057    PT Time Calculation (min) 38 min    Activity Tolerance Patient tolerated treatment well    Behavior During Therapy WFL for tasks assessed/performed               Past Medical History:  Diagnosis Date   Arthritis    Asthma    if have cold   Back pain    Gallstones    GERD (gastroesophageal reflux disease)    HOH (hard of hearing)    no hearing aids   Hyperlipidemia    Hypertension    Hypokalemia    Pneumonia    hx   Past Surgical History:  Procedure Laterality Date   ABDOMINAL SURGERY     APPENDECTOMY     BACK SURGERY     lower slipped disc   BACK SURGERY     CHOLECYSTECTOMY N/A 04/24/2013   Procedure: LAPAROSCOPIC CHOLECYSTECTOMY WITH INTRAOPERATIVE CHOLANGIOGRAM;  Surgeon: Clovis Pu. Cornett, MD;  Location: [facility] OR;  Service: General;  Laterality: N/A;  attempted   CHOLECYSTECTOMY  04/24/2013   Procedure: CHOLECYSTECTOMY;  Surgeon: Clovis Pu. Cornett, MD;  Location: [facility];  Service: General;;   CHOLECYSTECTOMY     COLONOSCOPY     LAPAROSCOPIC PARTIAL COLECTOMY N/A 08/16/2013   Procedure: ATTEMPTED LAPAROSCOPIC PARTIAL COLECTOMY CONVERTED TO OPEN;  Surgeon: Maisie Fus A. Cornett, MD;  Location: [facility];  Service: General;  Laterality: N/A;   LYSIS OF ADHESION N/A 04/24/2013   Procedure: LYSIS OF ADHESION;  Surgeon: Clovis Pu. 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   S/P partial colectomy 08/31/2013   Fever 08/31/2013   Normocytic anemia 08/31/2013   Hyperglycemia 08/31/2013   Hyponatremia 08/31/2013   Postoperative ileus (HCC) 08/23/2013   Cecum mass 07/20/2013   Mass of appendix 06/29/2013   Post-operative state 05/14/2013   S/P cholecystectomy March 40981 04/29/2013    PCP: Marin Comment, FNP  REFERRING PROVIDER: Eldred Manges, MD  REFERRING DIAG: M54.50,G89.29 (ICD-10-CM) - Chronic right-sided low back pain, unspecified whether sciatica present  Rationale for Evaluation and Treatment: Rehabilitation  THERAPY DIAG:  Other low back pain  Abnormal posture  ONSET DATE: Chronic "years"  SUBJECTIVE:  SUBJECTIVE STATEMENT:  The other morning I couldn't move, really sore like a workout feeling. The top of my ankle was burning from the top of the foot up but its ok now.     EVAL: Pt indicated "pain for years."  Pt indicated feeling pain in back and sometimes in Rt leg shooting quickly about every day.  Pt. Indicated various random movements in day cause pain.  Pt indicated Rt leg numbness prior to surgery, improved with surgery but then returned.  Pt indicated numbness on Rt lower leg/foot.  Pt. Indicated unable to sleep in bed due to symptoms.   PERTINENT HISTORY:  Low back pain, previous right L4-5 microdiscectomy 20 years ago  PAIN:  NPRS scale: current back: 0/10 "no pain RLE is still  just numb like it always is"  Pain location: R LE  Pain description:  numbness  Aggravating factors:  n/a  Relieving factors:  n/a   PRECAUTIONS: None  WEIGHT BEARING RESTRICTIONS: No  FALLS:  Has patient fallen in last 6 months? No  LIVING ENVIRONMENT: Lives with: lives with their family Lives in: House/apartment Stairs: has attic/basement but no stairs to bedroom.  Has to step to instead of reciprocal.   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   PLOF: Independent, travel to beach/vacation.  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   PATIENT GOALS: Reduce pain  OBJECTIVE:   PATIENT SURVEYS:  01/25/2023 FOTO eval:   36 predicted:  50  SCREENING FOR RED FLAGS: 01/25/2023 Bowel or bladder incontinence: No Cauda equina syndrome: No  COGNITION: 01/25/2023 Overall cognitive status: WFL normal      SENSATION: 01/25/2023 Not tested  MUSCLE LENGTH: 01/25/2023 No specific testing.   POSTURE:  01/25/2023 rounded shoulders, forward head, decreased lumbar lordosis, and flexed trunk   PALPATION: 01/25/2023 No specific tenderness noted in back  LUMBAR ROM:  01/25/2023  Directional Preference Assessment: Centralization: not present Peripheralization: no present  AROM 01/25/2023  Flexion To knees with back pain noted  Extension 50% WFL s symptoms  Repeated in standing x 5: Improved to 75%  Right lateral flexion   Left lateral flexion   Right rotation   Left rotation    (Blank rows = not tested)  LOWER EXTREMITY ROM:      Right 01/25/2023 Left 01/25/2023  Hip flexion    Hip extension    Hip abduction    Hip adduction    Hip internal rotation    Hip external rotation    Knee flexion    Knee extension    Ankle dorsiflexion    Ankle plantarflexion    Ankle inversion    Ankle eversion     (Blank rows = not tested)  LOWER EXTREMITY MMT:    MMT Right 01/25/2023 Left 01/25/2023  Hip flexion 5/5 5/5  Hip extension    Hip abduction    Hip adduction    Hip internal rotation    Hip external  rotation    Knee flexion 5/5 5/5  Knee extension 5/5 5/5  Ankle dorsiflexion 5/5 5/5  Ankle plantarflexion    Ankle inversion    Ankle eversion     (Blank rows = not tested)  LUMBAR SPECIAL TESTS:  01/25/2023 (+) Slump bilateral    FUNCTIONAL TESTS:  01/25/2023 18 inch chair transfer: able on 1st try s UE assist.   GAIT: 01/25/2023 Forward trunk lean in walking.  TODAY'S TREATMENT:                                                                                                         DATE:    02/01/23  TherEx  Nustep L5x8 minutes BLEs only SKTC 5x5 seconds B Bridges x12 Piriformis stretch 2x30 seconds B Lumbar rotation stretch 5x5 seconds B Seated TA set 12x3 second holds  3 way QL stretch x30 seconds each TA set + march x10 B  HS stretches 2x30 seconds B    Heavy cues for breathing and exercise form given throughout session- really tends to tense up and hold breath with any given exercises    01/28/23  TherEx  Nustep L5 x6 minutes BLEs only for w/u and tissue perfusion  SKTC 5x5 seconds B  Lumbar rotation stretch 5x5 seconds B  Supine figure 4 stretch 2x30 seconds B  HS stretches 2x30 seconds B  Bridges x12 Supine TA sets 15x3 seconds Hip flexor stretch 1x60 seconds B supine off edge of table   Education on how holding breath/straining can overall increase mm tension and prime nervous system to be more sensitive to pain as a whole     01/25/2023   Therex:    HEP instruction/performance c cues for techniques, handout provided.  Trial set performed of each for comprehension and symptom assessment.  See below for exercise list  PATIENT EDUCATION:  01/25/2023 Education details: HEP, POC Person educated: Patient Education method: Explanation,  Demonstration, Verbal cues, and Handouts Education comprehension: verbalized understanding, returned demonstration, and verbal cues required  HOME EXERCISE PROGRAM: Access Code: [email or web address] URL: [email or web address] Date: 01/25/2023 Prepared by: Chyrel Masson  Exercises - Supine Lower Trunk Rotation  - 1-2 x daily - 7 x weekly - 1 sets - 3-5 reps - 15 hold - Hooklying Single Knee to Chest Stretch  - 1-2 x daily - 7 x weekly - 1 sets - 5 reps - 15 hold - Supine 90/90 Sciatic Nerve Glide with Knee Flexion/Extension  - 1-2 x daily - 7 x weekly - 1-2 sets - 10 reps - Standing Lumbar Extension with Counter  - 3-5 x daily - 7 x weekly - 1 sets - 5-10 reps  ASSESSMENT:  CLINICAL IMPRESSION:   Pt arrives today doing OK, sounds like he did have some DOMS after last PT session from new exercises/activities- reassured him that this is normal given activities done to address tight/weak musculature and should improve over time. Education given that new exercises may cause some discomfort- stretching, tightness, mm soreness, etc- but again this will improve over time as tissue pliabilty and strength improves. Will continue efforts.      EVAL: Patient is a 72 y.o. who comes to clinic with complaints of back pain with mobility deficits that impair their ability to perform usual daily and recreational functional activities without increase difficulty/symptoms at this time.  Patient to benefit from skilled PT services to address impairments and limitations to improve to previous level of function without restriction secondary to condition.   OBJECTIVE IMPAIRMENTS: Abnormal gait, decreased activity  tolerance, decreased coordination, decreased endurance, decreased mobility, difficulty walking, decreased ROM, hypomobility, increased fascial restrictions, impaired perceived functional ability, impaired flexibility, improper body mechanics, postural dysfunction, and pain.   ACTIVITY LIMITATIONS:  carrying, lifting, bending, sitting, standing, squatting, sleeping, stairs, transfers, bed mobility, and locomotion level  PARTICIPATION LIMITATIONS: meal prep, cleaning, laundry, interpersonal relationship, shopping, community activity, occupation, and yard work  PERSONAL FACTORS:  Chronic history of back pain, GERD, hyperlipidemia, HTN, arthritis  are also affecting patient's functional outcome.   REHAB POTENTIAL: Fair to good  CLINICAL DECISION MAKING: Stable/uncomplicated  EVALUATION COMPLEXITY: Low   GOALS: Goals reviewed with patient? Yes  SHORT TERM GOALS: (target date for Short term goals are 3 weeks 02/15/2023)  1. Patient will demonstrate independent use of home exercise program to maintain progress from in clinic treatments.  Goal status: New  LONG TERM GOALS: (target dates for all long term goals are 10 weeks  04/05/2023 )   1. Patient will demonstrate/report pain at worst less than or equal to 2/10 to facilitate minimal limitation in daily activity secondary to pain symptoms.  Goal status: New   2. Patient will demonstrate independent use of home exercise program to facilitate ability to maintain/progress functional gains from skilled physical therapy services.  Goal status: New   3. Patient will demonstrate FOTO outcome > or = 50 % to indicate reduced disability due to condition.  Goal status: New   4. Patient will demonstrate lumbar extension 100 % WFL s symptoms to facilitate upright standing, walking posture at PLOF s limitation.  Goal status: New   5.  Patient will demonstrate/report ability to sleep in bed for return to normal sleeping positioning.   Goal status: New   6.  Patient will demonstrate/report ability to perform walking, standing, sitting > 30 mins s limitation due to back pain.  Goal status: New   PLAN:  PT FREQUENCY: 1-2x/week  PT DURATION: 10 weeks  PLANNED INTERVENTIONS: Can include 84166- PT Re-evaluation, 97110-Therapeutic  exercises, 97530- Therapeutic activity, [id number]- Neuromuscular re-education, 97535- Self Care, 97140- Manual therapy, [phone]- Gait training, [phone]- Orthotic Fit/training, [phone]- Canalith repositioning, [id number]- Aquatic Therapy, 97014- Electrical stimulation (unattended), [id number]- Electrical stimulation (manual), [id number]- Vasopneumatic device, [id number]- Ultrasound, [id number]- Traction (mechanical), [id number]- Ionotophoresis 4mg /ml Dexamethasone, Patient/Family education, Balance training, Stair training, Taping, Dry Needling, Joint mobilization, Joint manipulation, Spinal manipulation, Spinal mobilization, Scar mobilization, Vestibular training, Visual/preceptual remediation/compensation, DME instructions, Cryotherapy, and Moist heat.  All performed as medically necessary.  All included unless contraindicated  PLAN FOR NEXT SESSION: Review HEP knowledge/results PRN and update as appropriate.  Possible benefit from manual for lumbar mobility gains. Fairly HOH, does better in tx rooms with less background noise     Nedra Hai, PT, DPT 02/01/23 10:59 AM

## 2023-02-03 ENCOUNTER — Ambulatory Visit (INDEPENDENT_AMBULATORY_CARE_PROVIDER_SITE_OTHER): Payer: Medicare Other | Admitting: Rehabilitative and Restorative Service Providers"

## 2023-02-03 ENCOUNTER — Encounter: Payer: Self-pay | Admitting: Rehabilitative and Restorative Service Providers"

## 2023-02-03 DIAGNOSIS — R293 Abnormal posture: Secondary | ICD-10-CM

## 2023-02-03 DIAGNOSIS — M5459 Other low back pain: Secondary | ICD-10-CM

## 2023-02-03 NOTE — Therapy (Signed)
OUTPATIENT PHYSICAL THERAPY TREATMENT    Patient Name: Zachary Kim MRN: [id number] DOB:02-Apr-1950, 72 y.o., male Today's Date: 02/03/2023  END OF SESSION:  PT End of Session - 02/03/23 1417     Visit Number 4    Number of Visits 20    Date for PT Re-Evaluation 04/05/23    Authorization Type Medicare/ AARP    Progress Note Due on Visit 10    PT Start Time 1422    PT Stop Time 1501    PT Time Calculation (min) 39 min    Activity Tolerance Patient tolerated treatment well    Behavior During Therapy WFL for tasks assessed/performed                Past Medical History:  Diagnosis Date   Arthritis    Asthma    if have cold   Back pain    Gallstones    GERD (gastroesophageal reflux disease)    HOH (hard of hearing)    no hearing aids   Hyperlipidemia    Hypertension    Hypokalemia    Pneumonia    hx   Past Surgical History:  Procedure Laterality Date   ABDOMINAL SURGERY     APPENDECTOMY     BACK SURGERY     lower slipped disc   BACK SURGERY     CHOLECYSTECTOMY N/A 04/24/2013   Procedure: LAPAROSCOPIC CHOLECYSTECTOMY WITH INTRAOPERATIVE CHOLANGIOGRAM;  Surgeon: Clovis Pu. Cornett, MD;  Location: [facility];  Service: General;  Laterality: N/A;  attempted   CHOLECYSTECTOMY  04/24/2013   Procedure: CHOLECYSTECTOMY;  Surgeon: Clovis Pu. Cornett, MD;  Location: [facility];  Service: General;;   CHOLECYSTECTOMY     COLONOSCOPY     LAPAROSCOPIC PARTIAL COLECTOMY N/A 08/16/2013   Procedure: ATTEMPTED LAPAROSCOPIC PARTIAL COLECTOMY CONVERTED TO OPEN;  Surgeon: Maisie Fus A. Cornett, MD;  Location: [facility];  Service: General;  Laterality: N/A;   LYSIS OF ADHESION N/A 04/24/2013   Procedure: LYSIS OF ADHESION;  Surgeon: Clovis Pu. 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   S/P partial colectomy 08/31/2013   Fever 08/31/2013   Normocytic anemia 08/31/2013   Hyperglycemia 08/31/2013   Hyponatremia 08/31/2013   Postoperative ileus (HCC) 08/23/2013   Cecum mass 07/20/2013   Mass of appendix 06/29/2013   Post-operative state 05/14/2013   S/P cholecystectomy March 40981 04/29/2013    PCP: Marin Comment, FNP  REFERRING PROVIDER: Eldred Manges, MD  REFERRING DIAG: M54.50,G89.29 (ICD-10-CM) - Chronic right-sided low back pain, unspecified whether sciatica present  Rationale for Evaluation and Treatment: Rehabilitation  THERAPY DIAG:  Other low back pain  Abnormal posture  ONSET DATE: Chronic "years"  SUBJECTIVE:  SUBJECTIVE STATEMENT: Pt indicated feeling about the same overall.  Pt indicated scheduled for MRI in Feb at this time (might move earlier).   Pt indicated having numbness in legs.  Pt indicated catching of pain into leg.  Pt indicated no specific difference in activity tolerance per report.   PERTINENT HISTORY:  Low back pain, previous right L4-5 microdiscectomy 20 years ago  PAIN:  NPRS scale: at worst 10/10 Pain location: back, Rt leg Pain description: quick pains, numbness  Aggravating factors: transfers, getting out of the bed.  Relieving factors:  reported nothing specific.   PRECAUTIONS: None  WEIGHT BEARING RESTRICTIONS: No  FALLS:  Has patient fallen in last 6  months? No  LIVING ENVIRONMENT: Lives with: lives with their family Lives in: House/apartment Stairs: has attic/basement but no stairs to bedroom.  Has to step to instead of reciprocal.   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   PLOF: Independent, travel to beach/vacation.  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   PATIENT GOALS: Reduce pain  OBJECTIVE:   PATIENT SURVEYS:  01/25/2023 FOTO eval:   36 predicted:  50  SCREENING FOR RED FLAGS: 01/25/2023 Bowel or bladder incontinence: No Cauda equina syndrome: No  COGNITION: 01/25/2023 Overall cognitive status: WFL normal      SENSATION: 01/25/2023 Not tested  MUSCLE LENGTH: 01/25/2023 No specific testing.   POSTURE:  01/25/2023 rounded shoulders, forward head, decreased lumbar lordosis, and flexed trunk   PALPATION: 01/25/2023 No specific tenderness noted in back  LUMBAR ROM:  01/25/2023  Directional Preference Assessment: Centralization: not present Peripheralization: no present  AROM 01/25/2023 02/03/2023  Flexion To knees with back pain noted To mid shin, back pain noted. No change in leg symptoms.   Extension 50% WFL s symptoms  Repeated in standing x 5: Improved to 75% 50% WFL with improvement with 5 reps.  Improved to 75% c repeated extension.  No leg symptom changes.   Right lateral flexion    Left lateral flexion    Right rotation    Left rotation     (Blank rows = not tested)  LOWER EXTREMITY ROM:      Right 01/25/2023 Left 01/25/2023  Hip flexion    Hip extension    Hip abduction    Hip adduction    Hip internal rotation    Hip external rotation    Knee flexion    Knee extension    Ankle dorsiflexion    Ankle plantarflexion    Ankle inversion    Ankle eversion     (Blank rows = not tested)  LOWER EXTREMITY MMT:    MMT Right 01/25/2023 Left 01/25/2023  Hip flexion 5/5 5/5  Hip extension    Hip abduction    Hip adduction    Hip internal rotation    Hip  external rotation    Knee flexion 5/5 5/5  Knee extension 5/5 5/5  Ankle dorsiflexion 5/5 5/5  Ankle plantarflexion    Ankle inversion    Ankle eversion     (Blank rows = not tested)  LUMBAR SPECIAL TESTS:  01/25/2023 (+) Slump bilateral    FUNCTIONAL TESTS:  01/25/2023 18 inch chair transfer: able on 1st try s UE assist.   GAIT: 01/25/2023 Forward trunk lean in walking.  TODAY'S TREATMENT:                                                                                                   DATE:  02/03/2023 Therex: Prone press up on elbows x 10 Standing lumbar extension AROM 3 x 5 throughout visit Nustep lvl 6 UE/LE 10 mins  Supine lumbar trunk rotation stretch 15 sec x 3 bilateral  Supine SKC 15 sec x 3 bilateral   Manual: Prone over pillows cPA L1-L5 G3  TODAY'S TREATMENT:                                                                                                   DATE: 02/01/23 TherEx Nustep L5x8 minutes BLEs only SKTC 5x5 seconds B Bridges x12 Piriformis stretch 2x30 seconds B Lumbar rotation stretch 5x5 seconds B Seated TA set 12x3 second holds  3 way QL stretch x30 seconds each TA set + march x10 B  HS stretches 2x30 seconds B  Heavy cues for breathing and exercise form given throughout session- really tends to tense up and hold breath with any given exercises    TODAY'S TREATMENT:                                                                                                   DATE: 01/28/23 TherEx Nustep L5 x6 minutes BLEs only for w/u and tissue perfusion  SKTC 5x5 seconds B  Lumbar rotation stretch 5x5 seconds B  Supine figure 4 stretch 2x30 seconds B  HS stretches 2x30 seconds B  Bridges x12 Supine TA sets 15x3 seconds Hip flexor stretch  1x60 seconds B supine off edge of table   Education on how holding breath/straining can overall increase mm tension and prime nervous system to be more sensitive to pain as a whole    PATIENT EDUCATION:  01/25/2023 Education details: HEP, POC Person educated: Patient Education method: Programmer, multimedia, Demonstration, Verbal cues, and Handouts Education comprehension: verbalized understanding, returned demonstration, and verbal cues required  HOME EXERCISE PROGRAM: Access Code: [email or web address] URL: [email or web address] Date: 01/25/2023 Prepared by: Chyrel Masson  Exercises - Supine Lower Trunk Rotation  - 1-2 x daily - 7 x weekly - 1 sets - 3-5 reps - 15 hold - Hooklying Single Knee to Chest Stretch  - 1-2 x  daily - 7 x weekly - 1 sets - 5 reps - 15 hold - Supine 90/90 Sciatic Nerve Glide with Knee Flexion/Extension  - 1-2 x daily - 7 x weekly - 1-2 sets - 10 reps - Standing Lumbar Extension with Counter  - 3-5 x daily - 7 x weekly - 1 sets - 5-10 reps  ASSESSMENT:  CLINICAL IMPRESSION: Continued presentation of lumbar hypomobility with no clear centralization or peripheralization noted for leg symptom changes. Positive improvement in clinic mobility for lumbar noted after manual and therex intervention.   Continued skilled PT services in efforts to reduce symptoms, improve mobility indicated with pending MRI scheduled.   OBJECTIVE IMPAIRMENTS: Abnormal gait, decreased activity tolerance, decreased coordination, decreased endurance, decreased mobility, difficulty walking, decreased ROM, hypomobility, increased fascial restrictions, impaired perceived functional ability, impaired flexibility, improper body mechanics, postural dysfunction, and pain.   ACTIVITY LIMITATIONS: carrying, lifting, bending, sitting, standing, squatting, sleeping, stairs, transfers, bed mobility, and locomotion level  PARTICIPATION LIMITATIONS: meal prep, cleaning, laundry, interpersonal relationship,  shopping, community activity, occupation, and yard work  PERSONAL FACTORS:  Chronic history of back pain, GERD, hyperlipidemia, HTN, arthritis  are also affecting patient's functional outcome.   REHAB POTENTIAL: Fair to good  CLINICAL DECISION MAKING: Stable/uncomplicated  EVALUATION COMPLEXITY: Low   GOALS: Goals reviewed with patient? Yes  SHORT TERM GOALS: (target date for Short term goals are 3 weeks 02/15/2023)  1. Patient will demonstrate independent use of home exercise program to maintain progress from in clinic treatments.  Goal status: New  LONG TERM GOALS: (target dates for all long term goals are 10 weeks  04/05/2023 )   1. Patient will demonstrate/report pain at worst less than or equal to 2/10 to facilitate minimal limitation in daily activity secondary to pain symptoms.  Goal status: New   2. Patient will demonstrate independent use of home exercise program to facilitate ability to maintain/progress functional gains from skilled physical therapy services.  Goal status: New   3. Patient will demonstrate FOTO outcome > or = 50 % to indicate reduced disability due to condition.  Goal status: New   4. Patient will demonstrate lumbar extension 100 % WFL s symptoms to facilitate upright standing, walking posture at PLOF s limitation.  Goal status: New   5.  Patient will demonstrate/report ability to sleep in bed for return to normal sleeping positioning.   Goal status: New   6.  Patient will demonstrate/report ability to perform walking, standing, sitting > 30 mins s limitation due to back pain.  Goal status: New   PLAN:  PT FREQUENCY: 1-2x/week  PT DURATION: 10 weeks  PLANNED INTERVENTIONS: Can include 65784- PT Re-evaluation, 97110-Therapeutic exercises, 97530- Therapeutic activity, [id number]- Neuromuscular re-education, 97535- Self Care, 97140- Manual therapy, [phone]- Gait training, [phone]- Orthotic Fit/training, [phone]- Canalith repositioning, [id number]- Aquatic  Therapy, 97014- Electrical stimulation (unattended), [id number]- Electrical stimulation (manual), [id number]- Vasopneumatic device, [id number]- Ultrasound, [id number]- Traction (mechanical), [id number]- Ionotophoresis 4mg /ml Dexamethasone, Patient/Family education, Balance training, Stair training, Taping, Dry Needling, Joint mobilization, Joint manipulation, Spinal manipulation, Spinal mobilization, Scar mobilization, Vestibular training, Visual/preceptual remediation/compensation, DME instructions, Cryotherapy, and Moist heat.  All performed as medically necessary.  All included unless contraindicated  PLAN FOR NEXT SESSION: Check response of back mobility from today.     Chyrel Masson, PT, DPT, OCS, ATC 02/03/23  3:05 PM

## 2023-02-07 ENCOUNTER — Ambulatory Visit: Payer: Medicare Other | Admitting: Physical Therapy

## 2023-02-07 ENCOUNTER — Encounter: Payer: Self-pay | Admitting: Physical Therapy

## 2023-02-07 DIAGNOSIS — M5459 Other low back pain: Secondary | ICD-10-CM | POA: Diagnosis not present

## 2023-02-07 DIAGNOSIS — R293 Abnormal posture: Secondary | ICD-10-CM | POA: Diagnosis not present

## 2023-02-07 NOTE — Therapy (Signed)
OUTPATIENT PHYSICAL THERAPY TREATMENT    Patient Name: Zachary Kim MRN: [id number] DOB:[birth year], 72 y.o., male Today's Date: 02/07/2023  END OF SESSION:  PT End of Session - 02/07/23 1448     Visit Number 5    Number of Visits 20    Date for PT Re-Evaluation 04/05/23    Authorization Type Medicare/ [employer]    Progress Note Due on Visit 10    PT Start Time 1450    PT Stop Time 1530    PT Time Calculation (min) 40 min    Activity Tolerance Patient tolerated treatment well    Behavior During Therapy WFL for tasks assessed/performed                Past Medical History:  Diagnosis Date   Arthritis    Asthma    if have cold   Back pain    Gallstones    GERD (gastroesophageal reflux disease)    HOH (hard of hearing)    no hearing aids   Hyperlipidemia    Hypertension    Hypokalemia    Pneumonia    hx   Past Surgical History:  Procedure Laterality Date   ABDOMINAL SURGERY     APPENDECTOMY     BACK SURGERY     lower slipped disc   BACK SURGERY     CHOLECYSTECTOMY N/A 04/24/2013   Procedure: LAPAROSCOPIC CHOLECYSTECTOMY WITH INTRAOPERATIVE CHOLANGIOGRAM;  Surgeon: Clovis Pu. Cornett, MD;  Location: [facility] OR;  Service: General;  Laterality: N/A;  attempted   CHOLECYSTECTOMY  04/24/2013   Procedure: CHOLECYSTECTOMY;  Surgeon: Clovis Pu. Cornett, MD;  Location: [facility] OR;  Service: General;;   CHOLECYSTECTOMY     COLONOSCOPY     LAPAROSCOPIC PARTIAL COLECTOMY N/A 08/16/2013   Procedure: ATTEMPTED LAPAROSCOPIC PARTIAL COLECTOMY CONVERTED TO OPEN;  Surgeon: Maisie Fus A. Cornett, MD;  Location: [facility] OR;  Service: General;  Laterality: N/A;   LYSIS OF ADHESION N/A 04/24/2013   Procedure: LYSIS OF ADHESION;  Surgeon: Clovis Pu. 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   S/P partial colectomy 08/31/2013   Fever 08/31/2013   Normocytic anemia 08/31/2013   Hyperglycemia 08/31/2013   Hyponatremia 08/31/2013   Postoperative ileus (HCC) 08/23/2013   Cecum mass 07/20/2013   Mass of appendix 06/29/2013   Post-operative state 05/14/2013   S/P cholecystectomy March [id number] 04/29/2013    PCP: Marin Comment, FNP  REFERRING PROVIDER: Eldred Manges, MD  REFERRING DIAG: M54.50,G89.29 (ICD-10-CM) - Chronic right-sided low back pain, unspecified whether sciatica present  Rationale for Evaluation and Treatment: Rehabilitation  THERAPY DIAG:  Other low back pain  Abnormal posture  ONSET DATE: Chronic "years"  SUBJECTIVE:  SUBJECTIVE STATEMENT: Pt reporting no pain upon arrival. Pt only reporting numbness down his Rt LE into his Rt foot. Pt feels like his mobility is improving, but still presenting with sharp pain down his Rt LE at certain times. Pt stating he can't seem to pin point the exact motion or cause of the pain.   PERTINENT HISTORY:  Low back pain, previous right L4-5 microdiscectomy 20 years ago  PAIN:  NPRS scale: at worse pt reporting pain shooting down his leg can reach 6-7/10 Pain location: back, Rt leg Pain description: quick pains, numbness, sharp  Aggravating factors: transfers, getting out of the bed.  Relieving factors:  reported nothing specific.   PRECAUTIONS: None  WEIGHT  BEARING RESTRICTIONS: No  FALLS:  Has patient fallen in last 6 months? No  LIVING ENVIRONMENT: Lives with: lives with their family Lives in: House/apartment Stairs: has attic/basement but no stairs to bedroom.  Has to step to instead of reciprocal.   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   PLOF: Independent, travel to beach/vacation.  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   PATIENT GOALS: Reduce pain  OBJECTIVE:   PATIENT SURVEYS:  01/25/2023 FOTO eval:   36   predicted:  50  SCREENING FOR RED FLAGS: 01/25/2023 Bowel or bladder incontinence: No Cauda equina syndrome: No  COGNITION: 01/25/2023 Overall cognitive status: WFL normal      SENSATION: 01/25/2023 Not tested  MUSCLE LENGTH: 01/25/2023 No specific testing.   POSTURE:  01/25/2023 rounded shoulders, forward head, decreased lumbar lordosis, and flexed trunk   PALPATION: 01/25/2023 No specific tenderness noted in back  LUMBAR ROM:  01/25/2023  Directional Preference Assessment: Centralization: not present Peripheralization: no present  AROM 01/25/2023 02/03/2023  Flexion To knees with back pain noted To mid shin, back pain noted. No change in leg symptoms.   Extension 50% WFL s symptoms  Repeated in standing x 5: Improved to 75% 50% WFL with improvement with 5 reps.  Improved to 75% c repeated extension.  No leg symptom changes.   Right lateral flexion    Left lateral flexion    Right rotation    Left rotation     (Blank rows = not tested)  LOWER EXTREMITY ROM:      Right 01/25/2023 Left 01/25/2023 Rt / Left 02/07/23  Hip flexion   Supine  AAROM 105 / 112  Hip extension     Hip abduction     Hip adduction     Hip internal rotation     Hip external rotation     Knee flexion     Knee extension     Ankle dorsiflexion     Ankle plantarflexion     Ankle inversion     Ankle eversion      (Blank rows = not tested)  LOWER EXTREMITY MMT:    MMT Right 01/25/2023  Left 01/25/2023  Hip flexion 5/5 5/5  Hip extension    Hip abduction    Hip adduction    Hip internal rotation    Hip external rotation    Knee flexion 5/5 5/5  Knee extension 5/5 5/5  Ankle dorsiflexion 5/5 5/5  Ankle plantarflexion    Ankle inversion    Ankle eversion     (Blank rows = not tested)  LUMBAR SPECIAL TESTS:  01/25/2023 (+) Slump bilateral    FUNCTIONAL TESTS:  01/25/2023 18 inch chair transfer: able on 1st try s UE assist.   GAIT: 01/25/2023 Forward trunk lean  in walking.                                                                                                                                                                                                                    TODAY'S TREATMENT:                                                                                                   DATE:  02/07/2023 Therex: Nustep: level 6 x 8 minutes UE/LE Standing hip extension: 2 x 10 bil LE c UE support Standing lumbar extension AROM 3 x 5 throughout visit Supine PPT: x 10 holding 5 sec,, difficulty c technique c verbal and tactile cues.  Supine lumbar trunk rotation stretch 20 sec x 3 bil Supine SKTC: x 3 holding 30 sec bil LE  Manual: Prone over pillows PA L1-L5 Grade 2-3 Percussing over  lumbar paraspinals     TODAY'S TREATMENT:                                                                                                   DATE:  02/03/2023 Therex: Prone press up on elbows x 10 Standing lumbar extension AROM 3 x 5 throughout visit Nustep lvl 6 UE/LE 10 mins  Supine lumbar trunk rotation stretch 15 sec x 3 bilateral  Supine SKC 15 sec x 3 bilateral   Manual: Prone over pillows cPA L1-L5 G3  TODAY'S TREATMENT:  DATE: 02/01/23 TherEx Nustep L5x8 minutes BLEs only SKTC 5x5 seconds B Bridges x12 Piriformis stretch 2x30 seconds B Lumbar  rotation stretch 5x5 seconds B Seated TA set 12x3 second holds  3 way QL stretch x30 seconds each TA set + march x10 B  HS stretches 2x30 seconds B  Heavy cues for breathing and exercise form given throughout session- really tends to tense up and hold breath with any given exercises    TODAY'S TREATMENT:                                                                                                   DATE: 01/28/23 TherEx Nustep L5 x6 minutes BLEs only for w/u and tissue perfusion  SKTC 5x5 seconds B  Lumbar rotation stretch 5x5 seconds B  Supine figure 4 stretch 2x30 seconds B  HS stretches 2x30 seconds B  Bridges x12 Supine TA sets 15x3 seconds Hip flexor stretch 1x60 seconds B supine off edge of table   Education on how holding breath/straining can overall increase mm tension and prime nervous system to be more sensitive to pain as a whole    PATIENT EDUCATION:  01/25/2023 Education details: HEP, POC Person educated: Patient Education method: Programmer, multimedia, Facilities manager, Verbal cues, and Handouts Education comprehension: verbalized understanding, returned demonstration, and verbal cues required  HOME EXERCISE PROGRAM: Access Code: [email or web address] URL: [email or web address] Date: 01/25/2023 Prepared by: Chyrel Masson  Exercises - Supine Lower Trunk Rotation  - 1-2 x daily - 7 x weekly - 1 sets - 3-5 reps - 15 hold - Hooklying Single Knee to Chest Stretch  - 1-2 x daily - 7 x weekly - 1 sets - 5 reps - 15 hold - Supine 90/90 Sciatic Nerve Glide with Knee Flexion/Extension  - 1-2 x daily - 7 x weekly - 1-2 sets - 10 reps - Standing Lumbar Extension with Counter  - 3-5 x daily - 7 x weekly - 1 sets - 5-10 reps  ASSESSMENT:  CLINICAL IMPRESSION: Pt reporting some improvements in his lumbar mobility following mobilizations last sessio and today. Pt still however reporting shooting pains down his Rt LE at times while at home. Pt still having a difficult time  dissociating his pelvis from his trunk.  Continued skilled PT services in efforts to reduce symptoms, improve mobility indicated with pending MRI scheduled.   OBJECTIVE IMPAIRMENTS: Abnormal gait, decreased activity tolerance, decreased coordination, decreased endurance, decreased mobility, difficulty walking, decreased ROM, hypomobility, increased fascial restrictions, impaired perceived functional ability, impaired flexibility, improper body mechanics, postural dysfunction, and pain.   ACTIVITY LIMITATIONS: carrying, lifting, bending, sitting, standing, squatting, sleeping, stairs, transfers, bed mobility, and locomotion level  PARTICIPATION LIMITATIONS: meal prep, cleaning, laundry, interpersonal relationship, shopping, community activity, occupation, and yard work  PERSONAL FACTORS:  Chronic history of back pain, GERD, hyperlipidemia, HTN, arthritis  are also affecting patient's functional outcome.   REHAB POTENTIAL: Fair to good  CLINICAL DECISION MAKING: Stable/uncomplicated  EVALUATION COMPLEXITY: Low   GOALS: Goals reviewed with patient? Yes  SHORT TERM GOALS: (target date for Short term goals are 3 weeks 02/15/2023)  1. Patient will  demonstrate independent use of home exercise program to maintain progress from in clinic treatments.  Goal status: New  LONG TERM GOALS: (target dates for all long term goals are 10 weeks  04/05/2023 )   1. Patient will demonstrate/report pain at worst less than or equal to 2/10 to facilitate minimal limitation in daily activity secondary to pain symptoms.  Goal status: New   2. Patient will demonstrate independent use of home exercise program to facilitate ability to maintain/progress functional gains from skilled physical therapy services.  Goal status: New   3. Patient will demonstrate FOTO outcome > or = 50 % to indicate reduced disability due to condition.  Goal status: New   4. Patient will demonstrate lumbar extension 100 % WFL s  symptoms to facilitate upright standing, walking posture at PLOF s limitation.  Goal status: New   5.  Patient will demonstrate/report ability to sleep in bed for return to normal sleeping positioning.   Goal status: New   6.  Patient will demonstrate/report ability to perform walking, standing, sitting > 30 mins s limitation due to back pain.  Goal status: New   PLAN:  PT FREQUENCY: 1-2x/week  PT DURATION: 10 weeks  PLANNED INTERVENTIONS: Can include 37628- PT Re-evaluation, 97110-Therapeutic exercises, 97530- Therapeutic activity, [id number]- Neuromuscular re-education, 97535- Self Care, 97140- Manual therapy, [phone]- Gait training, [phone]- Orthotic Fit/training, [phone]- Canalith repositioning, [id number]- Aquatic Therapy, 97014- Electrical stimulation (unattended), [id number]- Electrical stimulation (manual), [id number]- Vasopneumatic device, [id number]- Ultrasound, [id number]- Traction (mechanical), [id number]- Ionotophoresis 4mg /ml Dexamethasone, Patient/Family education, Balance training, Stair training, Taping, Dry Needling, Joint mobilization, Joint manipulation, Spinal manipulation, Spinal mobilization, Scar mobilization, Vestibular training, Visual/preceptual remediation/compensation, DME instructions, Cryotherapy, and Moist heat.  All performed as medically necessary.  All included unless contraindicated  PLAN FOR NEXT SESSION:  Lumbar mobility and core strengthening as tolerated How were mobs and percussion?    Narda Amber, PT, MPT 02/07/23 3:48 PM   02/07/23  3:48 PM

## 2023-02-10 ENCOUNTER — Encounter: Payer: Medicare Other | Admitting: Rehabilitative and Restorative Service Providers"

## 2023-02-14 ENCOUNTER — Encounter: Payer: Medicare Other | Admitting: Rehabilitative and Restorative Service Providers"

## 2023-02-17 ENCOUNTER — Ambulatory Visit (INDEPENDENT_AMBULATORY_CARE_PROVIDER_SITE_OTHER): Payer: Medicare Other | Admitting: Audiology

## 2023-02-17 ENCOUNTER — Institutional Professional Consult (permissible substitution) (INDEPENDENT_AMBULATORY_CARE_PROVIDER_SITE_OTHER): Payer: Medicare Other

## 2023-02-21 ENCOUNTER — Encounter: Payer: Medicare Other | Admitting: Rehabilitative and Restorative Service Providers"

## 2023-02-24 ENCOUNTER — Encounter: Payer: Self-pay | Admitting: Rehabilitative and Restorative Service Providers"

## 2023-02-24 ENCOUNTER — Ambulatory Visit (INDEPENDENT_AMBULATORY_CARE_PROVIDER_SITE_OTHER): Payer: Medicare Other | Admitting: Rehabilitative and Restorative Service Providers"

## 2023-02-24 DIAGNOSIS — M5459 Other low back pain: Secondary | ICD-10-CM | POA: Diagnosis not present

## 2023-02-24 DIAGNOSIS — R293 Abnormal posture: Secondary | ICD-10-CM | POA: Diagnosis not present

## 2023-02-24 NOTE — Therapy (Addendum)
 OUTPATIENT PHYSICAL THERAPY TREATMENT / PROGRESS NOTE / DISCHARGE   Patient Name: Zachary Kim MRN: [id number] DOB:09/19/1950, 73 y.o., male Today's Date: 02/24/2023  Progress Note Reporting Period 01/25/2023 to 02/24/2023  See note below for Objective Data and Assessment of Progress/Goals.      END OF SESSION:  PT End of Session - 02/24/23 1337     Visit Number 6    Number of Visits 20    Date for PT Re-Evaluation 04/05/23    Authorization Type Medicare/ [employer]    Progress Note Due on Visit 10    PT Start Time 1338    PT Stop Time 1410    PT Time Calculation (min) 32 min    Activity Tolerance Patient tolerated treatment well    Behavior During Therapy WFL for tasks assessed/performed                 Past Medical History:  Diagnosis Date   Arthritis    Asthma    if have cold   Back pain    Gallstones    GERD (gastroesophageal reflux disease)    HOH (hard of hearing)    no hearing aids   Hyperlipidemia    Hypertension    Hypokalemia    Pneumonia    hx   Past Surgical History:  Procedure Laterality Date   ABDOMINAL SURGERY     APPENDECTOMY     BACK SURGERY     lower slipped disc   BACK SURGERY     CHOLECYSTECTOMY N/A 04/24/2013   Procedure: LAPAROSCOPIC CHOLECYSTECTOMY WITH INTRAOPERATIVE CHOLANGIOGRAM;  Surgeon: Maisie Fus A. Cornett, MD;  Location: [facility] OR;  Service: General;  Laterality: N/A;  attempted   CHOLECYSTECTOMY  04/24/2013   Procedure: CHOLECYSTECTOMY;  Surgeon: Clovis Pu. Cornett, MD;  Location: [facility] OR;  Service: General;;   CHOLECYSTECTOMY     COLONOSCOPY     LAPAROSCOPIC PARTIAL COLECTOMY N/A 08/16/2013   Procedure: ATTEMPTED LAPAROSCOPIC PARTIAL COLECTOMY CONVERTED TO OPEN;  Surgeon: Maisie Fus A. Cornett, MD;  Location: [facility];  Service: General;  Laterality: N/A;   LYSIS OF ADHESION N/A 04/24/2013   Procedure: LYSIS OF ADHESION;  Surgeon: Clovis Pu. Cornett, MD;  Location: [facility];  Service: General;  Laterality: N/A;   PARTIAL COLECTOMY  08/16/2013    OPEN      BY CORNETT   PERIPHERALLY INSERTED CENTRAL CATHETER INSERTION Right 09/27/13   SMALL INTESTINE SURGERY     intestine blockage   Patient Active Problem List   Diagnosis Date Noted   Lumbar back pain with radiculopathy affecting right lower extremity 02/01/2023   Loss of weight 09/28/2013   FUO (fever of unknown origin) 09/28/2013   Diarrhea 09/14/2013   Anorexia 09/14/2013   Hypokalemia 09/14/2013   Thrombocytopenia (HCC) 09/14/2013   Failure to thrive in adult 09/13/2013   Tubulovillous adenoma of colon 08/31/2013   S/P partial colectomy 08/31/2013   Fever 08/31/2013   Normocytic anemia 08/31/2013   Hyperglycemia 08/31/2013   Hyponatremia 08/31/2013   Postoperative ileus (HCC) 08/23/2013   Cecum mass 07/20/2013   Mass of appendix 06/29/2013   Post-operative state 05/14/2013   S/P cholecystectomy March 40981 04/29/2013    PCP: Marin Comment, FNP  REFERRING PROVIDER: Eldred Manges, MD  REFERRING DIAG: M54.50,G89.29 (ICD-10-CM) - Chronic right-sided low back pain, unspecified whether sciatica present  Rationale for Evaluation and Treatment: Rehabilitation  THERAPY DIAG:  Other low back pain  Abnormal posture  ONSET DATE: Chronic "years"  SUBJECTIVE:  SUBJECTIVE STATEMENT: Pt indicated sometimes hurting but not all the time.   Pt was unable to easily describe symptom reactions in daily activity.  MRI scheduled for Feb.  Will see MD for follow.  Pt indicated moving a little better without the pain but roughly the same trouble.   PERTINENT HISTORY:  Low back pain, previous right L4-5 microdiscectomy 20 years ago  PAIN:  NPRS scale: at worse 6/10 Pain location: back, Rt leg Pain description: quick pains, numbness, sharp  Aggravating factors: transfers, getting out of the bed.   Relieving factors:  reported nothing specific.   PRECAUTIONS: None  WEIGHT BEARING RESTRICTIONS: No  FALLS:  Has patient fallen in last 6 months? No  LIVING ENVIRONMENT: Lives with: lives with their family Lives in: House/apartment Stairs: has attic/basement but no stairs to bedroom.  Has to step to instead of reciprocal.   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   PLOF: Independent, travel to beach/vacation.  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   PATIENT GOALS: Reduce pain  OBJECTIVE:   PATIENT SURVEYS:  02/24/2023: FOTO update:  56  01/25/2023 FOTO eval:   36   predicted:  50  SCREENING FOR RED FLAGS: 01/25/2023 Bowel or bladder incontinence: No Cauda equina syndrome: No  COGNITION: 01/25/2023 Overall cognitive status: WFL normal      SENSATION: 01/25/2023 Not tested  MUSCLE LENGTH: 01/25/2023 No specific testing.   POSTURE:  01/25/2023 rounded shoulders, forward head, decreased lumbar lordosis, and flexed trunk   PALPATION: 01/25/2023 No specific tenderness noted in back  LUMBAR ROM:  01/25/2023  Directional Preference Assessment: Centralization: not present Peripheralization: no present  AROM 01/25/2023 02/03/2023 02/24/2023  Flexion To knees with back pain noted To mid shin, back pain noted. No change in leg symptoms.  To mid shin, back pain noted  Extension 50% WFL s symptoms  Repeated in standing x 5: Improved to 75% 50% WFL with improvement with 5 reps.  Improved to 75% c repeated extension.  No leg symptom changes.  75% WFL With tightness  Repeated x 5 in standing: Reduced tightness  Right lateral flexion     Left lateral flexion     Right rotation     Left rotation      (Blank rows = not tested)  LOWER EXTREMITY ROM:      Right 01/25/2023 Left 01/25/2023 Rt / Left 02/07/23  Hip flexion   Supine  AAROM 105 / 112  Hip extension     Hip abduction     Hip adduction     Hip internal rotation     Hip  external rotation     Knee flexion     Knee extension     Ankle dorsiflexion     Ankle plantarflexion     Ankle inversion     Ankle eversion      (Blank rows = not tested)  LOWER EXTREMITY MMT:    MMT Right 01/25/2023 Left 01/25/2023  Hip flexion 5/5 5/5  Hip extension    Hip abduction    Hip adduction    Hip internal rotation    Hip external rotation    Knee flexion 5/5 5/5  Knee extension 5/5 5/5  Ankle dorsiflexion 5/5 5/5  Ankle plantarflexion    Ankle inversion    Ankle eversion     (Blank rows = not tested)  LUMBAR SPECIAL TESTS:  01/25/2023 (+) Slump bilateral    FUNCTIONAL TESTS:  01/25/2023 18 inch chair transfer:  able on 1st try s UE assist.   GAIT: 01/25/2023 Forward trunk lean in walking.                                                                                                                                                                                                                   TODAY'S TREATMENT:                                                                                                   DATE:  02/24/2023 Therex: Nustep Lvl 6 10 mins UE/LE for aerobic exercise and mobility Standing lumbar AROM extension x 5  Supine bridge x 10  Supine lumbar trunk rotation stretch 15 sec x 3 bilateral   Review of existing HEP and plan for continued use prior to return to MD for MRI follow up.    TODAY'S TREATMENT:                                                                                                   DATE:  02/07/2023 Therex: Nustep: level 6 x 8 minutes UE/LE Standing hip extension: 2 x 10 bil LE c UE support Standing lumbar extension AROM 3 x 5 throughout visit Supine PPT: x 10 holding 5 sec,, difficulty c technique c verbal and tactile cues.  Supine lumbar trunk rotation stretch 20 sec x 3 bil Supine SKTC: x 3 holding 30 sec bil LE  Manual: Prone over pillows PA L1-L5 Grade 2-3 Percussing over  lumbar paraspinals  TODAY'S  TREATMENT:  DATE:  02/03/2023 Therex: Prone press up on elbows x 10 Standing lumbar extension AROM 3 x 5 throughout visit Nustep lvl 6 UE/LE 10 mins  Supine lumbar trunk rotation stretch 15 sec x 3 bilateral  Supine SKC 15 sec x 3 bilateral  Manual: Prone over pillows cPA L1-L5 G3  PATIENT EDUCATION:  02/24/2023 Education details: HEP update Person educated: Patient Education method: Programmer, multimedia, Demonstration, Verbal cues, and Handouts Education comprehension: verbalized understanding, returned demonstration, and verbal cues required  HOME EXERCISE PROGRAM: Access Code: [email or web address] URL: [email or web address] Date: 02/24/2023 Prepared by: Chyrel Masson  Exercises - Supine Lower Trunk Rotation  - 1-2 x daily - 7 x weekly - 1 sets - 3-5 reps - 15 hold - Hooklying Single Knee to Chest Stretch  - 1-2 x daily - 7 x weekly - 1 sets - 5 reps - 15 hold - Supine 90/90 Sciatic Nerve Glide with Knee Flexion/Extension  - 1-2 x daily - 7 x weekly - 1-2 sets - 10 reps - Standing Lumbar Extension with Counter  - 3-5 x daily - 7 x weekly - 1 sets - 5-10 reps - Supine Bridge  - 1-2 x daily - 7 x weekly - 1-2 sets - 10 reps - 2 hold - Supine Piriformis Stretch with Foot on Ground  - 1-2 x daily - 7 x weekly - 1 sets - 3 reps - 15 hold  ASSESSMENT:  CLINICAL IMPRESSION: The patient has attended 6 visits over the course of treatment cycle.  Symptoms at worst mildly reported as improved but symptoms still present. Patient has reported global rating of change at about the same +0.  See objective data above for updated information regarding current presentation.  Pt may continue to benefit from efforts to improve lumbar and hip mobility and reduced symptoms related to presentation.  Reviewed HEP to help promote improved knowledge.  At this time. Recommendation was made to focus with HEP at this time  and return post MRI and MD follow up for updates.    Of note, FOTO score improved greatly compared to evaluation presentation which indicated improved function.    OBJECTIVE IMPAIRMENTS: Abnormal gait, decreased activity tolerance, decreased coordination, decreased endurance, decreased mobility, difficulty walking, decreased ROM, hypomobility, increased fascial restrictions, impaired perceived functional ability, impaired flexibility, improper body mechanics, postural dysfunction, and pain.   ACTIVITY LIMITATIONS: carrying, lifting, bending, sitting, standing, squatting, sleeping, stairs, transfers, bed mobility, and locomotion level  PARTICIPATION LIMITATIONS: meal prep, cleaning, laundry, interpersonal relationship, shopping, community activity, occupation, and yard work  PERSONAL FACTORS:  Chronic history of back pain, GERD, hyperlipidemia, HTN, arthritis  are also affecting patient's functional outcome.   REHAB POTENTIAL: Fair to good  CLINICAL DECISION MAKING: Stable/uncomplicated  EVALUATION COMPLEXITY: Low   GOALS: Goals reviewed with patient? Yes  SHORT TERM GOALS: (target date for Short term goals are 3 weeks 02/15/2023)  1. Patient will demonstrate independent use of home exercise program to maintain progress from in clinic treatments.  Goal status: Met  LONG TERM GOALS: (target dates for all long term goals are 10 weeks  04/05/2023 )   1. Patient will demonstrate/report pain at worst less than or equal to 2/10 to facilitate minimal limitation in daily activity secondary to pain symptoms.  Goal status: on going 02/24/2023   2. Patient will demonstrate independent use of home exercise program to facilitate ability to maintain/progress functional gains from skilled physical therapy services.  Goal status: on going 02/24/2023   3. Patient will demonstrate FOTO outcome >  or = 50 % to indicate reduced disability due to condition.  Goal status: Met1/10/2023   4. Patient will  demonstrate lumbar extension 100 % WFL s symptoms to facilitate upright standing, walking posture at PLOF s limitation.  Goal status: on going 02/24/2023   5.  Patient will demonstrate/report ability to sleep in bed for return to normal sleeping positioning.   Goal status: on going 02/24/2023   6.  Patient will demonstrate/report ability to perform walking, standing, sitting > 30 mins s limitation due to back pain.  Goal status: on going 02/24/2023   PLAN:  PT FREQUENCY: 1-2x/week  PT DURATION: 10 weeks  PLANNED INTERVENTIONS: Can include 16109- PT Re-evaluation, 97110-Therapeutic exercises, 97530- Therapeutic activity, 97112- Neuromuscular re-education, 97535- Self Care, 97140- Manual therapy, [phone]- Gait training, [phone]- Orthotic Fit/training, [phone]- Canalith repositioning, [id number]- Aquatic Therapy, 97014- Electrical stimulation (unattended), [id number]- Electrical stimulation (manual), [id number]- Vasopneumatic device, [id number]- Ultrasound, [id number]- Traction (mechanical), [id number]- Ionotophoresis 4mg /ml Dexamethasone, Patient/Family education, Balance training, Stair training, Taping, Dry Needling, Joint mobilization, Joint manipulation, Spinal manipulation, Spinal mobilization, Scar mobilization, Vestibular training, Visual/preceptual remediation/compensation, DME instructions, Cryotherapy, and Moist heat.  All performed as medically necessary.  All included unless contraindicated  PLAN FOR NEXT SESSION: Follow up with MD and MRI reporting.    Chyrel Masson, PT, DPT, OCS, ATC 02/24/23  2:13 PM  PHYSICAL THERAPY DISCHARGE SUMMARY  Visits from Start of Care: 6  Current functional level related to goals / functional outcomes: See note   Remaining deficits: See note   Education / Equipment: HEP  Patient goals were partially met. Patient is being discharged due to not returning since the last visit.  Chyrel Masson, PT, DPT, OCS, ATC 05/23/23  1:26 PM

## 2023-03-19 ENCOUNTER — Ambulatory Visit
Admission: RE | Admit: 2023-03-19 | Discharge: 2023-03-19 | Disposition: A | Discharge status: Home / Self Care | Payer: Medicare Other | Source: Ambulatory Visit | Attending: Orthopaedic Surgery | Admitting: Orthopaedic Surgery

## 2023-03-19 DIAGNOSIS — M545 Low back pain, unspecified: Secondary | ICD-10-CM

## 2023-03-19 MED ORDER — GADOPICLENOL 0.5 MMOL/ML IV SOLN
10.0000 mL | Freq: Once | INTRAVENOUS | Status: AC | PRN
  Administered 2023-03-19: 10 mL via INTRAVENOUS

## 2023-04-05 ENCOUNTER — Ambulatory Visit (INDEPENDENT_AMBULATORY_CARE_PROVIDER_SITE_OTHER): Payer: Medicare Other | Admitting: Orthopaedic Surgery

## 2023-04-05 ENCOUNTER — Encounter: Payer: Self-pay | Admitting: Orthopaedic Surgery

## 2023-04-05 VITALS — BP 161/78 | HR 73

## 2023-04-05 DIAGNOSIS — M5416 Radiculopathy, lumbar region: Secondary | ICD-10-CM | POA: Diagnosis not present

## 2023-04-05 NOTE — Progress Notes (Signed)
Office Visit Note   Patient: Zachary Kim           Date of Birth: [birth year]           MRN: [id number] Visit Date: 04/05/2023              Requested by: Marin Comment, FNP [id number] [place],  [place] [address] PCP: Marin Comment, FNP   Assessment & Plan: Visit Diagnoses:  1. 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  Will set him up for epidural injection.  Follow-up 5 weeks.  Follow-Up Instructions: Return in about 5 weeks (around 05/10/2023).   Orders:  No orders of the defined types were placed in this encounter.  No orders of the defined types were placed in this encounter.     Procedures: No procedures performed   Clinical Data: No additional findings.   Subjective: Chief Complaint  Patient presents with   Lower Back - Pain, Follow-up    MRI review    HPI 73 year old returns post lumbar MRI scan.  Previous surgery greater than 20 years ago done at [facility] with the right side laminectomy L4-5.  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  He has mostly symptoms on the right does not have claudication symptoms.  Sometimes he feels like his right leg will give way on him he has to be careful to avoid falling.  Review of Systems updated unchanged from 01/05/2023 office visit.   Objective: Vital Signs: BP (!) 161/78   Pulse 73   Physical Exam Constitutional:      Appearance: He is well-developed.  HENT:     Head: Normocephalic and atraumatic.     Right Ear: External ear normal.     Left Ear: External ear normal.  Eyes:     Pupils: Pupils are equal, round, and reactive to light.  Neck:     Thyroid: No thyromegaly.      Trachea: No tracheal deviation.  Cardiovascular:     Rate and Rhythm: Normal rate.  Pulmonary:     Effort: Pulmonary effort is normal.     Breath sounds: No wheezing.  Abdominal:     General: Bowel sounds are normal.     Palpations: Abdomen is soft.  Musculoskeletal:     Cervical back: Neck supple.  Skin:    General: Skin is warm and dry.     Capillary Refill: Capillary refill takes less than 2 seconds.  Neurological:     Mental Status: He is alert and oriented to person, place, and time.  Psychiatric:        Behavior: Behavior normal.        Thought Content: Thought content normal.        Judgment: Judgment normal.     Ortho Exam well-healed lumbar incision.  Sciatic notch tenderness on the right negative on the left.  Pain straight leg raising on the right 80 degrees.  He has a little bit of balance problems with heel toe walking.  Anterior tib gastrocsoleus is strong on the right.  Specialty Comments:  No specialty comments available.  Imaging: Narrative & Impression  CLINICAL DATA:  Low back pain with right leg  numbness, pain and weakness, increasing over the last year. No recent injury. Remote back surgery.   EXAM: MRI LUMBAR SPINE WITHOUT AND WITH CONTRAST   TECHNIQUE: Multiplanar and multiecho pulse sequences of the lumbar spine were obtained without and with intravenous contrast.   CONTRAST:  10 cc Vueway   COMPARISON:  Lumbar spine radiographs 01/05/2023. Abdominopelvic CT 09/13/2013.   FINDINGS: Segmentation: Conventional anatomy assumed, with the last open disc space designated L5-S1.Concordant with prior imaging.   Alignment:  Physiologic.   Vertebrae: No worrisome osseous lesion, acute fracture or pars defect. Chronic endplate degenerative changes at L4-5. The visualized sacroiliac joints appear unremarkable.   Conus medullaris: Extends to the L1-2 level. The conus and cauda equina appear normal.   Paraspinal and other soft tissues: No  significant paraspinal findings.   Disc levels:   Sagittal images demonstrate no significant disc space findings within the visualized lower thoracic spine. Chronic partial ankylosis across the T11-12 disc space.   L1-2: Preserved disc height. Mild disc bulging and mild bilateral facet hypertrophy. No spinal stenosis or nerve root encroachment.   L2-3: Preserved disc height with mild disc bulging and mild to moderate bilateral facet hypertrophy. No significant spinal stenosis or nerve root encroachment.   L3-4: Preserved disc height with mild disc bulging and mild to moderate bilateral facet hypertrophy. No significant spinal stenosis or nerve root encroachment.   L4-5: Postsurgical changes consistent with remote right hemilaminectomy. Chronic loss of disc height with annular disc bulging and endplate osteophytes asymmetric to the right. Moderate bilateral facet hypertrophy. Borderline spinal stenosis with mild to moderate lateral recess narrowing bilaterally. The foramina are sufficiently patent.   L5-S1: Preserved disc height with mild disc bulging and endplate osteophytes. Mild bilateral facet hypertrophy. No significant spinal stenosis or nerve root encroachment.   IMPRESSION: 1. No acute findings or clear explanation for the patient's symptoms. 2. Remote right hemilaminectomy at L4-5 with chronic disc bulging and endplate osteophytes asymmetric to the right. Borderline spinal stenosis with mild to moderate lateral recess narrowing bilaterally. 3. Mild disc bulging and facet hypertrophy at the remaining levels without significant spinal stenosis or nerve root encroachment.     Electronically Signed   By: Carey Bullocks M.D.   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   S/P partial colectomy 08/31/2013   Fever 08/31/2013   Normocytic anemia 08/31/2013   Hyperglycemia 08/31/2013   Hyponatremia 08/31/2013   Postoperative ileus (HCC) 08/23/2013   Cecum mass 07/20/2013   Mass of appendix 06/29/2013   Post-operative state 05/14/2013   S/P cholecystectomy March 40981 04/29/2013   Past Medical History:  Diagnosis Date   Arthritis    Asthma    if have cold   Back pain    Gallstones    GERD (gastroesophageal reflux disease)    HOH (hard of hearing)    no hearing aids   Hyperlipidemia    Hypertension    Hypokalemia    Pneumonia    hx    Family History  Problem Relation Age of Onset   Diabetes Mother    Heart disease  Father        had valve replacement   Breast cancer Sister    Colon cancer Neg Hx     Past Surgical History:  Procedure Laterality Date   ABDOMINAL SURGERY     APPENDECTOMY     BACK SURGERY     lower slipped disc   BACK SURGERY     CHOLECYSTECTOMY N/A 04/24/2013   Procedure: LAPAROSCOPIC CHOLECYSTECTOMY WITH INTRAOPERATIVE CHOLANGIOGRAM;  Surgeon: Clovis Pu. Cornett, MD;  Location: [facility];  Service: General;  Laterality: N/A;  attempted   CHOLECYSTECTOMY  04/24/2013   Procedure: CHOLECYSTECTOMY;  Surgeon: Clovis Pu. Cornett, MD;  Location: [facility];  Service: General;;   CHOLECYSTECTOMY     COLONOSCOPY     LAPAROSCOPIC PARTIAL COLECTOMY N/A 08/16/2013   Procedure: ATTEMPTED LAPAROSCOPIC PARTIAL COLECTOMY CONVERTED TO OPEN;  Surgeon: Maisie Fus A. Cornett, MD;  Location: [facility];  Service: General;  Laterality: N/A;   LYSIS OF ADHESION N/A 04/24/2013   Procedure: LYSIS OF ADHESION;  Surgeon: Clovis Pu. Cornett, MD;  Location: [facility];  Service: General;  Laterality: N/A;   PARTIAL COLECTOMY  08/16/2013   OPEN      BY CORNETT   PERIPHERALLY  INSERTED CENTRAL CATHETER INSERTION Right 09/27/13   SMALL INTESTINE SURGERY     intestine blockage   Social History   Occupational History   Occupation: self employed  Tobacco Use   Smoking status: Former    Current packs/day: 0.00    Average packs/day: 2.0 packs/day for 12.0 years (24.0 ttl pk-yrs)    Types: Cigarettes    Start date: 04/10/1971    Quit date: 04/10/1983    Years since quitting: 40.0   Smokeless tobacco: Current    Types: Chew   Tobacco comments:    chew tobacco, tobacco info given 06/29/13  Vaping Use   Vaping status: Never Used  Substance and Sexual Activity   Alcohol use: Yes    Comment: about 70-80 can a week   Drug use: Never   Sexual activity: Not on file

## 2023-04-05 NOTE — Addendum Note (Signed)
Addended by: Rogers Seeds on: 04/05/2023 12:59 PM   Modules accepted: Orders

## 2023-04-18 ENCOUNTER — Other Ambulatory Visit: Payer: Self-pay

## 2023-04-18 ENCOUNTER — Ambulatory Visit (INDEPENDENT_AMBULATORY_CARE_PROVIDER_SITE_OTHER): Payer: Medicare Other | Admitting: Physical Medicine and Rehabilitation

## 2023-04-18 VITALS — BP 172/82 | HR 83

## 2023-04-18 DIAGNOSIS — M5416 Radiculopathy, lumbar region: Secondary | ICD-10-CM

## 2023-04-18 MED ORDER — METHYLPREDNISOLONE ACETATE 40 MG/ML IJ SUSP
40.0000 mg | Freq: Once | INTRAMUSCULAR | Status: AC
  Administered 2023-04-18: 40 mg

## 2023-04-18 NOTE — Procedures (Signed)
 Lumbosacral Transforaminal Epidural Steroid Injection - Sub-Pedicular Approach with Fluoroscopic Guidance  Patient: Zachary Kim      Date of Birth: Aug 29, 1950 MRN: [id number] PCP: Marin Comment, FNP      Visit Date: 04/18/2023   Universal Protocol:    Date/Time: 04/18/2023  Consent Given By: the patient  Position: PRONE  Additional Comments: Vital signs were monitored before and after the procedure. Patient was prepped and draped in the usual sterile fashion. The correct patient, procedure, and site was verified.   Injection Procedure Details:   Procedure diagnoses: Lumbar radiculopathy [M54.16]    Meds Administered:  Meds ordered this encounter  Medications   methylPREDNISolone acetate (DEPO-MEDROL) injection 40 mg    Laterality: Right  Location/Site: L4  Needle:5.0 in., 22 ga.  Short bevel or Quincke spinal needle  Needle Placement: Transforaminal  Findings:    -Comments: Excellent flow of contrast along the nerve, nerve root and into the epidural space.  Procedure Details: After squaring off the end-plates to get a true AP view, the C-arm was positioned so that an oblique view of the foramen as noted above was visualized. The target area is just inferior to the "nose of the scotty dog" or sub pedicular. The soft tissues overlying this structure were infiltrated with 2-3 ml. of 1% Lidocaine without Epinephrine.  The spinal needle was inserted toward the target using a "trajectory" view along the fluoroscope beam.  Under AP and lateral visualization, the needle was advanced so it did not puncture dura and was located close the 6 O'Clock position of the pedical in AP tracterory. Biplanar projections were used to confirm position. Aspiration was confirmed to be negative for CSF and/or blood. A 1-2 ml. volume of Isovue-250 was injected and flow of contrast was noted at each level. Radiographs were obtained for documentation purposes.   After attaining the desired flow  of contrast documented above, a 0.5 to 1.0 ml test dose of 0.25% Marcaine was injected into each respective transforaminal space.  The patient was observed for 90 seconds post injection.  After no sensory deficits were reported, and normal lower extremity motor function was noted,   the above injectate was administered so that equal amounts of the injectate were placed at each foramen (level) into the transforaminal epidural space.   Additional Comments:  No complications occurred Dressing: 2 x 2 sterile gauze and Band-Aid    Post-procedure details: Patient was observed during the procedure. Post-procedure instructions were reviewed.  Patient left the clinic in stable condition.

## 2023-04-18 NOTE — Patient Instructions (Signed)

## 2023-04-18 NOTE — Progress Notes (Signed)
 Zachary Kim - 73 y.o. male MRN [id number]  Date of birth: [birth year]  Office Visit Note: Visit Date: 04/18/2023 PCP: Marin Comment, FNP Referred by: Marin Comment, FNP  Subjective: Chief Complaint  Patient presents with   Lower Back - Pain   HPI:  Zachary Kim is a 73 y.o. male who comes in today at the request of Dr. Annell Greening for planned Right L4-5 Lumbar Transforaminal epidural steroid injection with fluoroscopic guidance.  The patient has failed conservative care including home exercise, medications, time and activity modification.  This injection will be diagnostic and hopefully therapeutic.  Please see requesting physician notes for further details and justification.   ROS Otherwise per HPI.  Assessment & Plan: Visit Diagnoses:    ICD-10-CM   1. Lumbar radiculopathy  M54.16 XR C-ARM NO REPORT    Epidural Steroid injection    methylPREDNISolone acetate (DEPO-MEDROL) injection 40 mg      Plan: No additional findings.   Meds & Orders:  Meds ordered this encounter  Medications   methylPREDNISolone acetate (DEPO-MEDROL) injection 40 mg    Orders Placed This Encounter  Procedures   XR C-ARM NO REPORT   Epidural Steroid injection    Follow-up: Return for visit to requesting provider as needed.   Procedures: No procedures performed  Lumbosacral Transforaminal Epidural Steroid Injection - Sub-Pedicular Approach with Fluoroscopic Guidance  Patient: Zachary Kim      Date of Birth: Jul 03, 1950 MRN: [id number] PCP: Marin Comment, FNP      Visit Date: 04/18/2023   Universal Protocol:    Date/Time: 04/18/2023  Consent Given By: the patient  Position: PRONE  Additional Comments: Vital signs were monitored before and after the procedure. Patient was prepped and draped in the usual sterile fashion. The correct patient, procedure, and site was verified.   Injection Procedure Details:   Procedure diagnoses: Lumbar radiculopathy [M54.16]    Meds  Administered:  Meds ordered this encounter  Medications   methylPREDNISolone acetate (DEPO-MEDROL) injection 40 mg    Laterality: Right  Location/Site: L4  Needle:5.0 in., 22 ga.  Short bevel or Quincke spinal needle  Needle Placement: Transforaminal  Findings:    -Comments: Excellent flow of contrast along the nerve, nerve root and into the epidural space.  Procedure Details: After squaring off the end-plates to get a true AP view, the C-arm was positioned so that an oblique view of the foramen as noted above was visualized. The target area is just inferior to the "nose of the scotty dog" or sub pedicular. The soft tissues overlying this structure were infiltrated with 2-3 ml. of 1% Lidocaine without Epinephrine.  The spinal needle was inserted toward the target using a "trajectory" view along the fluoroscope beam.  Under AP and lateral visualization, the needle was advanced so it did not puncture dura and was located close the 6 O'Clock position of the pedical in AP tracterory. Biplanar projections were used to confirm position. Aspiration was confirmed to be negative for CSF and/or blood. A 1-2 ml. volume of Isovue-250 was injected and flow of contrast was noted at each level. Radiographs were obtained for documentation purposes.   After attaining the desired flow of contrast documented above, a 0.5 to 1.0 ml test dose of 0.25% Marcaine was injected into each respective transforaminal space.  The patient was observed for 90 seconds post injection.  After no sensory deficits were reported, and normal lower extremity motor function was noted,   the above injectate was administered so  that equal amounts of the injectate were placed at each foramen (level) into the transforaminal epidural space.   Additional Comments:  No complications occurred Dressing: 2 x 2 sterile gauze and Band-Aid    Post-procedure details: Patient was observed during the procedure. Post-procedure instructions  were reviewed.  Patient left the clinic in stable condition.    Clinical History: CLINICAL DATA:  Low back pain with right leg numbness, pain and weakness, increasing over the last year. No recent injury. Remote back surgery.   EXAM: MRI LUMBAR SPINE WITHOUT AND WITH CONTRAST   TECHNIQUE: Multiplanar and multiecho pulse sequences of the lumbar spine were obtained without and with intravenous contrast.   CONTRAST:  10 cc Vueway   COMPARISON:  Lumbar spine radiographs 01/05/2023. Abdominopelvic CT 09/13/2013.   FINDINGS: Segmentation: Conventional anatomy assumed, with the last open disc space designated L5-S1.Concordant with prior imaging.   Alignment:  Physiologic.   Vertebrae: No worrisome osseous lesion, acute fracture or pars defect. Chronic endplate degenerative changes at L4-5. The visualized sacroiliac joints appear unremarkable.   Conus medullaris: Extends to the L1-2 level. The conus and cauda equina appear normal.   Paraspinal and other soft tissues: No significant paraspinal findings.   Disc levels:   Sagittal images demonstrate no significant disc space findings within the visualized lower thoracic spine. Chronic partial ankylosis across the T11-12 disc space.   L1-2: Preserved disc height. Mild disc bulging and mild bilateral facet hypertrophy. No spinal stenosis or nerve root encroachment.   L2-3: Preserved disc height with mild disc bulging and mild to moderate bilateral facet hypertrophy. No significant spinal stenosis or nerve root encroachment.   L3-4: Preserved disc height with mild disc bulging and mild to moderate bilateral facet hypertrophy. No significant spinal stenosis or nerve root encroachment.   L4-5: Postsurgical changes consistent with remote right hemilaminectomy. Chronic loss of disc height with annular disc bulging and endplate osteophytes asymmetric to the right. Moderate bilateral facet hypertrophy. Borderline spinal stenosis  with mild to moderate lateral recess narrowing bilaterally. The foramina are sufficiently patent.   L5-S1: Preserved disc height with mild disc bulging and endplate osteophytes. Mild bilateral facet hypertrophy. No significant spinal stenosis or nerve root encroachment.   IMPRESSION: 1. No acute findings or clear explanation for the patient's symptoms. 2. Remote right hemilaminectomy at L4-5 with chronic disc bulging and endplate osteophytes asymmetric to the right. Borderline spinal stenosis with mild to moderate lateral recess narrowing bilaterally. 3. Mild disc bulging and facet hypertrophy at the remaining levels without significant spinal stenosis or nerve root encroachment.     Electronically Signed   By: Carey Bullocks M.D.   On: 04/03/2023 10:52     Objective:  VS:  HT:    WT:   BMI:     BP:(!) 172/82  HR:83bpm  TEMP: ( )  RESP:  Physical Exam Vitals and nursing note reviewed.  Constitutional:      General: He is not in acute distress.    Appearance: Normal appearance. He is not ill-appearing.  HENT:     Head: Normocephalic and atraumatic.     Right Ear: External ear normal.     Left Ear: External ear normal.     Nose: No congestion.  Eyes:     Extraocular Movements: Extraocular movements intact.  Cardiovascular:     Rate and Rhythm: Normal rate.     Pulses: Normal pulses.  Pulmonary:     Effort: Pulmonary effort is normal. No respiratory distress.  Abdominal:  General: There is no distension.     Palpations: Abdomen is soft.  Musculoskeletal:        General: No tenderness or signs of injury.     Cervical back: Neck supple.     Right lower leg: No edema.     Left lower leg: No edema.     Comments: Patient has good distal strength without clonus.  Skin:    Findings: No erythema or rash.  Neurological:     General: No focal deficit present.     Mental Status: He is alert and oriented to person, place, and time.     Sensory: No sensory deficit.      Motor: No weakness or abnormal muscle tone.     Coordination: Coordination normal.  Psychiatric:        Mood and Affect: Mood normal.        Behavior: Behavior normal.      Imaging: No results found.

## 2023-04-18 NOTE — Progress Notes (Signed)
 Pain Score--2 No Allergies to Contrast Dyes No Blood Thinners

## 2023-10-13 ENCOUNTER — Ambulatory Visit (INDEPENDENT_AMBULATORY_CARE_PROVIDER_SITE_OTHER): Admitting: Otolaryngology

## 2023-10-13 ENCOUNTER — Encounter (INDEPENDENT_AMBULATORY_CARE_PROVIDER_SITE_OTHER): Payer: Self-pay | Admitting: Otolaryngology

## 2023-10-13 ENCOUNTER — Ambulatory Visit (INDEPENDENT_AMBULATORY_CARE_PROVIDER_SITE_OTHER): Admitting: Audiology

## 2023-10-13 VITALS — BP 145/71 | HR 85 | Ht 72.0 in | Wt 240.0 lb

## 2023-10-13 DIAGNOSIS — H903 Sensorineural hearing loss, bilateral: Secondary | ICD-10-CM

## 2023-10-13 NOTE — Progress Notes (Signed)
  [address], [address] [place], [place] [address] [phone]  Audiological Evaluation    Name: Zachary Kim     DOB:   Jun 23, 1950      MRN:   [id number]                                                                                     Service Date: 10/13/2023     Accompanied by: unaccompanied to the booth   Patient comes today after Dr. Tobie, ENT sent a referral for a hearing evaluation due to concerns with hearing loss.   Symptoms Yes Details  Hearing loss  [x]  Known bilateral hearing loss, unsure if one ear is better than the other  Tinnitus  []    Ear pain/ infections/pressure  []    Balance problems  []    Noise exposure history  []    Previous ear surgeries  []    Family history of hearing loss  []    Amplification  [x]  Was fit with hearing aids elsewhere and he has not used amplification fore a few years ano because his hearing aids broke/got lost.  Other  []      Otoscopy: Right ear: Clear external ear canal and notable landmarks visualized on the tympanic membrane. Left ear:  Clear external ear canal and notable landmarks visualized on the tympanic membrane.  Tympanometry: Right ear: Type A- Normal external ear canal volume with normal middle ear pressure and tympanic membrane compliance. Left ear: Type A- Normal external ear canal volume with normal middle ear pressure and tympanic membrane compliance.   Pure tone Audiometry: Both ears- Normal to moderately severe sensorineural hearing loss from 125 Hz - 8000 Hz.    Speech Audiometry: Right ear- Speech Reception Threshold (SRT) was obtained at 45 dBHL. Left ear-Speech Reception Threshold (SRT) was obtained at 50 dBHL.   Word Recognition Score Tested using NU-6 (recorded) Right ear: 92% was obtained at a presentation level of 90 dBHL with contralateral masking which is deemed as  excellent. Left ear: 96% was obtained at a presentation level of 90 dBHL with contralateral masking which is deemed as   excellent.   The hearing test results were completed under headphones and results are deemed to be of good reliability. Test technique:  conventional     Recommendations: Follow up with ENT as scheduled for today. Return for a hearing evaluation if concerns with hearing changes arise or per MD recommendation. Consider a communication needs assessment after medical clearance for hearing aids is obtained.   Zachary Kim, AUD

## 2023-10-13 NOTE — Progress Notes (Signed)
 Dear Dr. Orlando, Here is my assessment for our mutual patient, Zachary Kim. Thank you for allowing me the opportunity to care for your patient. Please do not hesitate to contact me should you have any other questions. Sincerely, Dr. Eldora Blanch  Otolaryngology Clinic Note Referring provider: Dr. Orlando HPI:  Zachary Kim is a 73 y.o. male kindly referred by Dr. Orlando for evaluation of hearing loss  Initial visit (09/2023): Noted b/l chronic hearing loss left worse than right for many years, now getting worse. He did have hearing aids which he bought several years ago but lost one and the dog age the other one. Having issues in background noise now. Does have history of noise exposure (recreational shooting) without hearing protection. No tinnitus Patient denies: ear pain, fullness, vertigo, drainage Patient additionally denies: deep pain in ear canal, eustachian tube symptoms such as popping, crackling, sensitive to pressure changes Patient also denies barotrauma, vestibular suppressant use, ototoxic medication use Prior ear surgery: no Denies h/o ear infections or issues with early onset FHX of hearing loss  PMHx: HLD, HTN, Back Pain, GERD  Independent Review of Additional Tests or Records:  Dwayne Orlando (08/02/2023) Referral notes reviewed and uploaded or available in chart in media tab - noted decreased hearing, prior HA, now worsening; Dx: Hearing Loss; Rx: ref to ENT Labs BMP 04/18/2018: Na 131, Bun/Cr 10/1.22 09/2023 Audiogram was independently reviewed and interpreted by me and it reveals - b/l normal downsloping to mod/sev SNHL AU which is symmetric; WRT AD/AS 92%/96% at 90dB AU; A/A tymps   SNHL= Sensorineural hearing loss   PMH/Meds/All/SocHx/FamHx/ROS:   Past Medical History:  Diagnosis Date   Arthritis    Asthma    if have cold   Back pain    Gallstones    GERD (gastroesophageal reflux disease)    HOH (hard of hearing)    no hearing aids    Hyperlipidemia    Hypertension    Hypokalemia    Pneumonia    hx     Past Surgical History:  Procedure Laterality Date   ABDOMINAL SURGERY     APPENDECTOMY     BACK SURGERY     lower slipped disc   BACK SURGERY     CHOLECYSTECTOMY N/A 04/24/2013   Procedure: LAPAROSCOPIC CHOLECYSTECTOMY WITH INTRAOPERATIVE CHOLANGIOGRAM;  Surgeon: Debby A. Cornett, MD;  Location: [facility] OR;  Service: General;  Laterality: N/A;  attempted   CHOLECYSTECTOMY  04/24/2013   Procedure: CHOLECYSTECTOMY;  Surgeon: Debby LABOR. Cornett, MD;  Location: [facility] OR;  Service: General;;   CHOLECYSTECTOMY     COLONOSCOPY     LAPAROSCOPIC PARTIAL COLECTOMY N/A 08/16/2013   Procedure: ATTEMPTED LAPAROSCOPIC PARTIAL COLECTOMY CONVERTED TO OPEN;  Surgeon: Debby A. Cornett, MD;  Location: [facility] OR;  Service: General;  Laterality: N/A;   LYSIS OF ADHESION N/A 04/24/2013   Procedure: LYSIS OF ADHESION;  Surgeon: Debby LABOR. Cornett, MD;  Location: [facility] OR;  Service: General;  Laterality: N/A;   PARTIAL COLECTOMY  08/16/2013   OPEN      BY CORNETT   PERIPHERALLY INSERTED CENTRAL CATHETER INSERTION Right 09/27/13   SMALL INTESTINE SURGERY     intestine blockage    Family History  Problem Relation Age of Onset   Diabetes Mother    Heart disease Father        had valve replacement   Breast cancer Sister    Colon cancer Neg Hx      Social Connections: Not on file  Current Outpatient Medications:    acetaminophen  (TYLENOL ) 500 MG tablet, Take 500-1,000 mg by mouth every 6 (six) hours as needed (for pain)., Disp: , Rfl:    amLODipine (NORVASC) 5 MG tablet, Take 5 mg by mouth daily., Disp: , Rfl:    aspirin 81 MG chewable tablet, Chew 81 mg by mouth daily., Disp: , Rfl:    atorvastatin (LIPITOR) 10 MG tablet, Take 10 mg by mouth daily., Disp: , Rfl:    Cholecalciferol (VITAMIN D-1000 MAX ST) 1000 units tablet, Take 1 tablet by mouth daily., Disp: , Rfl:    Cholecalciferol (VITAMIN D-3) 25 MCG (1000 UT) CAPS, Take 1,000-2,000 Units  by mouth daily with breakfast., Disp: , Rfl:    colestipol (COLESTID) 1 g tablet, Take 1 g by mouth 2 (two) times daily., Disp: , Rfl:    Cyanocobalamin (VITAMIN B-12) 5000 MCG TBDP, Take 1 tablet by mouth daily., Disp: , Rfl:    Multiple Vitamins-Minerals (CENTRUM SILVER PO), Take 1 tablet by mouth daily., Disp: , Rfl:    pantoprazole  (PROTONIX ) 20 MG tablet, Take 20 mg daily by mouth., Disp: , Rfl:    Potassium Gluconate 595 MG CAPS, Take 1 capsule by mouth daily., Disp: , Rfl:    saccharomyces boulardii (FLORASTOR) 250 MG capsule, Take 1 capsule (250 mg total) by mouth 2 (two) times daily. (Patient taking differently: Take 250 mg by mouth daily.), Disp: 60 capsule, Rfl: 1   testosterone cypionate (DEPOTESTOSTERONE CYPIONATE) 200 MG/ML injection, SMARTSIG:0.5 Milliliter(s) IM Every 2 Weeks, Disp: , Rfl:    vitamin C (ASCORBIC ACID) 500 MG tablet, Take 500-1,000 mg by mouth daily., Disp: , Rfl:    Physical Exam:   BP (!) 145/71 (BP Location: Right Arm, Patient Position: Sitting, Cuff Size: Large)   Pulse 85   Ht 6' (1.829 m)   Wt 240 lb (108.9 kg)   SpO2 96%   BMI 32.55 kg/m   Salient findings:  CN II-XII intact Bilateral EAC clear and TM intact with well pneumatized middle ear spaces Weber 512: mid Rinne 512: AC > BC b/l  Anterior rhinoscopy: Septum intact; bilateral inferior turbinates without significant hypertrophy No lesions of oral cavity/oropharynx No obviously palpable neck masses/lymphadenopathy/thyromegaly No respiratory distress or stridor  Seprately Identifiable Procedures:  Prior to initiating any procedures, risks/benefits/alternatives were explained to the patient and verbal consent obtained. None  Impression & Plans:  Zachary Kim is a 73 y.o. male with:  1. Sensorineural hearing loss (SNHL) of both ears    Worsening hearing, chronic. Prior HA use but based on his symptoms and Audio, do think he is a good candidate for amplification. He was given the resource  sheet and will call us  if he has any issues. He'd like to get his audio care in one place so will f/u PRN (in case hearing worsens)  See below regarding exact medications prescribed this encounter including dosages and route: No orders of the defined types were placed in this encounter.     Thank you for allowing me the opportunity to care for your patient. Please do not hesitate to contact me should you have any other questions.  Sincerely, Eldora Blanch, MD Otolaryngologist (ENT), [facility] Phone: [phone] Fax: [phone]  10/13/2023, 12:08 PM   MDM:  Level 4 - 99204 Complexity/Problems addressed: mod - chronic worsening problem Data complexity: mod - independent review of note, lab, order/review test - Morbidity: low  - Prescription Drug prescribed or managed: no

## 2023-10-20 ENCOUNTER — Encounter: Payer: Self-pay | Admitting: Audiology
